# Patient Record
Sex: Female | Born: 1990 | Race: Black or African American | Hispanic: No | Marital: Single
Health system: Southern US, Community
[De-identification: ages and names within clinical notes are randomized; demographics above are authoritative.]

## PROBLEM LIST (undated history)

## (undated) ENCOUNTER — Inpatient Hospital Stay (HOSPITAL_COMMUNITY): Payer: Self-pay

## (undated) DIAGNOSIS — O3410 Maternal care for benign tumor of corpus uteri, unspecified trimester: Secondary | ICD-10-CM

## (undated) DIAGNOSIS — D259 Leiomyoma of uterus, unspecified: Secondary | ICD-10-CM

## (undated) DIAGNOSIS — Q512 Other doubling of uterus, unspecified: Secondary | ICD-10-CM

## (undated) DIAGNOSIS — Q5128 Other doubling of uterus, other specified: Secondary | ICD-10-CM

## (undated) DIAGNOSIS — R112 Nausea with vomiting, unspecified: Secondary | ICD-10-CM

## (undated) DIAGNOSIS — Z789 Other specified health status: Secondary | ICD-10-CM

## (undated) DIAGNOSIS — L509 Urticaria, unspecified: Secondary | ICD-10-CM

## (undated) DIAGNOSIS — T4145XA Adverse effect of unspecified anesthetic, initial encounter: Secondary | ICD-10-CM

## (undated) DIAGNOSIS — T8859XA Other complications of anesthesia, initial encounter: Secondary | ICD-10-CM

## (undated) DIAGNOSIS — D649 Anemia, unspecified: Secondary | ICD-10-CM

## (undated) DIAGNOSIS — Z9889 Other specified postprocedural states: Secondary | ICD-10-CM

## (undated) DIAGNOSIS — I499 Cardiac arrhythmia, unspecified: Secondary | ICD-10-CM

## (undated) HISTORY — PX: DILATION AND CURETTAGE OF UTERUS: SHX78

## (undated) HISTORY — PX: NO PAST SURGERIES: SHX2092

---
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
## 2010-02-20 ENCOUNTER — Emergency Department (HOSPITAL_COMMUNITY): Admission: EM | Admit: 2010-02-20 | Discharge: 2010-02-21 | Payer: Self-pay | Admitting: Emergency Medicine

## 2010-05-10 ENCOUNTER — Inpatient Hospital Stay (HOSPITAL_COMMUNITY): Admission: AD | Admit: 2010-05-10 | Discharge: 2010-05-13 | Payer: Self-pay | Admitting: Obstetrics

## 2010-11-11 ENCOUNTER — Ambulatory Visit (INDEPENDENT_AMBULATORY_CARE_PROVIDER_SITE_OTHER): Payer: Self-pay

## 2010-11-11 ENCOUNTER — Inpatient Hospital Stay (INDEPENDENT_AMBULATORY_CARE_PROVIDER_SITE_OTHER)
Admission: RE | Admit: 2010-11-11 | Discharge: 2010-11-11 | Disposition: A | Discharge status: Home / Self Care | Payer: Self-pay | Source: Ambulatory Visit | Attending: Family Medicine | Admitting: Family Medicine

## 2010-11-11 DIAGNOSIS — M79609 Pain in unspecified limb: Secondary | ICD-10-CM

## 2010-11-18 LAB — CBC
HCT: 30.4 % — ABNORMAL LOW (ref 36.0–46.0)
HCT: 34 % — ABNORMAL LOW (ref 36.0–46.0)
Hemoglobin: 10.1 g/dL — ABNORMAL LOW (ref 12.0–15.0)
Hemoglobin: 11.3 g/dL — ABNORMAL LOW (ref 12.0–15.0)
MCH: 27.3 pg (ref 26.0–34.0)
MCH: 27.7 pg (ref 26.0–34.0)
MCHC: 33.2 g/dL (ref 30.0–36.0)
MCHC: 33.2 g/dL (ref 30.0–36.0)
MCV: 82.3 fL (ref 78.0–100.0)
MCV: 83.4 fL (ref 78.0–100.0)
Platelets: 181 10*3/uL (ref 150–400)
Platelets: 198 10*3/uL (ref 150–400)
RBC: 3.64 MIL/uL — ABNORMAL LOW (ref 3.87–5.11)
RBC: 4.14 MIL/uL (ref 3.87–5.11)
RDW: 14.6 % (ref 11.5–15.5)
RDW: 14.9 % (ref 11.5–15.5)
WBC: 11.6 10*3/uL — ABNORMAL HIGH (ref 4.0–10.5)
WBC: 12.9 10*3/uL — ABNORMAL HIGH (ref 4.0–10.5)

## 2010-11-18 LAB — RPR: RPR Ser Ql: NONREACTIVE

## 2010-11-21 LAB — CBC
HCT: 33.6 % — ABNORMAL LOW (ref 36.0–46.0)
Hemoglobin: 11.3 g/dL — ABNORMAL LOW (ref 12.0–15.0)
MCHC: 33.8 g/dL (ref 30.0–36.0)
MCV: 86.6 fL (ref 78.0–100.0)
Platelets: 185 10*3/uL (ref 150–400)
RBC: 3.87 MIL/uL (ref 3.87–5.11)
RDW: 13.4 % (ref 11.5–15.5)
WBC: 13.6 10*3/uL — ABNORMAL HIGH (ref 4.0–10.5)

## 2010-11-21 LAB — URINALYSIS, ROUTINE W REFLEX MICROSCOPIC
Bilirubin Urine: NEGATIVE
Glucose, UA: NEGATIVE mg/dL
Hgb urine dipstick: NEGATIVE
Ketones, ur: >80 mg/dL — AB
Nitrite: NEGATIVE
Protein, ur: NEGATIVE mg/dL
Specific Gravity, Urine: 1.014 (ref 1.005–1.030)
Urobilinogen, UA: 0.2 mg/dL (ref 0.0–1.0)
pH: 6 (ref 5.0–8.0)

## 2010-11-21 LAB — BASIC METABOLIC PANEL
BUN: 3 mg/dL — ABNORMAL LOW (ref 6–23)
CO2: 22 mEq/L (ref 19–32)
Calcium: 8.5 mg/dL (ref 8.4–10.5)
Chloride: 108 mEq/L (ref 96–112)
Creatinine, Ser: 0.61 mg/dL (ref 0.4–1.2)
GFR calc Af Amer: >60 mL/min (ref >60)
GFR calc non Af Amer: >60 mL/min (ref >60)
Glucose, Bld: 82 mg/dL (ref 70–99)
Potassium: 3.3 mEq/L — ABNORMAL LOW (ref 3.5–5.1)
Sodium: 136 mEq/L (ref 135–145)

## 2010-11-21 LAB — POCT CARDIAC MARKERS
CKMB, poc: <1 ng/mL — ABNORMAL LOW (ref 1.0–8.0)
Myoglobin, poc: 31.3 ng/mL (ref 12–200)
Troponin i, poc: <0.05 ng/mL (ref 0.00–0.09)

## 2010-11-21 LAB — DIFFERENTIAL
Basophils Absolute: 0 10*3/uL (ref 0.0–0.1)
Basophils Relative: 0 % (ref 0–1)
Eosinophils Absolute: 0 10*3/uL (ref 0.0–0.7)
Eosinophils Relative: 0 % (ref 0–5)
Lymphocytes Relative: 4 % — ABNORMAL LOW (ref 12–46)
Lymphs Abs: 0.5 10*3/uL — ABNORMAL LOW (ref 0.7–4.0)
Monocytes Absolute: 1 10*3/uL (ref 0.1–1.0)
Monocytes Relative: 7 % (ref 3–12)
Neutro Abs: 12.1 10*3/uL — ABNORMAL HIGH (ref 1.7–7.7)
Neutrophils Relative %: 89 % — ABNORMAL HIGH (ref 43–77)

## 2010-11-25 LAB — URINALYSIS, ROUTINE W REFLEX MICROSCOPIC
Bilirubin Urine: NEGATIVE
Glucose, UA: NEGATIVE mg/dL
Ketones, ur: 40 mg/dL — AB
Nitrite: NEGATIVE
Protein, ur: NEGATIVE mg/dL
Specific Gravity, Urine: 1.02 (ref 1.005–1.030)
Urobilinogen, UA: 1 mg/dL (ref 0.0–1.0)
pH: 7 (ref 5.0–8.0)

## 2010-11-25 LAB — GC/CHLAMYDIA PROBE AMP, GENITAL
Chlamydia, DNA Probe: NEGATIVE
GC Probe Amp, Genital: NEGATIVE

## 2010-11-25 LAB — URINE MICROSCOPIC-ADD ON

## 2010-11-25 LAB — HCG, QUANTITATIVE, PREGNANCY: hCG, Beta Chain, Quant, S: 104078 m[IU]/mL — ABNORMAL HIGH (ref <5)

## 2010-11-25 LAB — POCT PREGNANCY, URINE: Preg Test, Ur: POSITIVE

## 2010-11-25 LAB — WET PREP, GENITAL
Clue Cells Wet Prep HPF POC: NONE SEEN
Trich, Wet Prep: NONE SEEN
Yeast Wet Prep HPF POC: NONE SEEN

## 2011-02-26 ENCOUNTER — Inpatient Hospital Stay (INDEPENDENT_AMBULATORY_CARE_PROVIDER_SITE_OTHER)
Admission: RE | Admit: 2011-02-26 | Discharge: 2011-02-26 | Disposition: A | Discharge status: Home / Self Care | Payer: Self-pay | Source: Ambulatory Visit | Attending: Family Medicine | Admitting: Family Medicine

## 2011-02-26 DIAGNOSIS — L509 Urticaria, unspecified: Secondary | ICD-10-CM

## 2012-02-02 ENCOUNTER — Encounter (HOSPITAL_COMMUNITY): Payer: Self-pay

## 2012-02-02 ENCOUNTER — Emergency Department (HOSPITAL_COMMUNITY)
Admission: EM | Admit: 2012-02-02 | Discharge: 2012-02-02 | Disposition: A | Discharge status: Home / Self Care | Payer: Self-pay | Attending: Emergency Medicine | Admitting: Emergency Medicine

## 2012-02-02 ENCOUNTER — Emergency Department (HOSPITAL_COMMUNITY): Payer: Self-pay

## 2012-02-02 DIAGNOSIS — S90129A Contusion of unspecified lesser toe(s) without damage to nail, initial encounter: Secondary | ICD-10-CM

## 2012-02-02 DIAGNOSIS — M25579 Pain in unspecified ankle and joints of unspecified foot: Secondary | ICD-10-CM | POA: Insufficient documentation

## 2012-02-02 DIAGNOSIS — S9030XA Contusion of unspecified foot, initial encounter: Secondary | ICD-10-CM | POA: Insufficient documentation

## 2012-02-02 DIAGNOSIS — R51 Headache: Secondary | ICD-10-CM | POA: Insufficient documentation

## 2012-02-02 DIAGNOSIS — M79609 Pain in unspecified limb: Secondary | ICD-10-CM | POA: Insufficient documentation

## 2012-02-02 LAB — POCT PREGNANCY, URINE: Preg Test, Ur: NEGATIVE

## 2012-02-02 MED ORDER — ACETAMINOPHEN 325 MG PO TABS
650.0000 mg | ORAL_TABLET | Freq: Once | ORAL | Status: AC
  Administered 2012-02-02: 650 mg via ORAL
  Filled 2012-02-02: qty 2

## 2012-02-02 NOTE — Discharge Instructions (Signed)
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
## 2012-02-03 NOTE — ED Provider Notes (Signed)
Medical screening examination/treatment/procedure(s) were performed by non-physician practitioner and as supervising physician I was immediately available for consultation/collaboration.   Dione Booze, MD 02/03/12 1210

## 2012-02-08 ENCOUNTER — Encounter (HOSPITAL_COMMUNITY): Payer: Self-pay | Admitting: Emergency Medicine

## 2012-02-08 ENCOUNTER — Emergency Department (HOSPITAL_COMMUNITY)
Admission: EM | Admit: 2012-02-08 | Discharge: 2012-02-09 | Disposition: A | Discharge status: Home / Self Care | Payer: Self-pay

## 2012-02-08 DIAGNOSIS — E876 Hypokalemia: Secondary | ICD-10-CM

## 2012-02-08 DIAGNOSIS — S29011A Strain of muscle and tendon of front wall of thorax, initial encounter: Secondary | ICD-10-CM

## 2012-02-08 DIAGNOSIS — R0602 Shortness of breath: Secondary | ICD-10-CM | POA: Insufficient documentation

## 2012-02-08 DIAGNOSIS — IMO0002 Reserved for concepts with insufficient information to code with codable children: Secondary | ICD-10-CM | POA: Insufficient documentation

## 2012-02-08 DIAGNOSIS — R079 Chest pain, unspecified: Secondary | ICD-10-CM | POA: Insufficient documentation

## 2012-02-08 NOTE — ED Notes (Signed)
PT. REPORTS SUBSTERNAL CHEST PAIN AND BILATERAL ARM PAIN ONSET THIS EVENING WITH SOB , NAUSEA AND LIGHTHEADEDNESS.

## 2012-02-09 ENCOUNTER — Emergency Department (HOSPITAL_COMMUNITY): Payer: Self-pay

## 2012-02-09 LAB — BASIC METABOLIC PANEL
BUN: 10 mg/dL (ref 6–23)
CO2: 20 mEq/L (ref 19–32)
Calcium: 9.6 mg/dL (ref 8.4–10.5)
Chloride: 105 mEq/L (ref 96–112)
Creatinine, Ser: 0.51 mg/dL (ref 0.50–1.10)
GFR calc Af Amer: >90 mL/min (ref >90)
GFR calc non Af Amer: >90 mL/min (ref >90)
Glucose, Bld: 80 mg/dL (ref 70–99)
Potassium: 3 mEq/L — ABNORMAL LOW (ref 3.5–5.1)
Sodium: 140 mEq/L (ref 135–145)

## 2012-02-09 LAB — DIFFERENTIAL
Basophils Absolute: 0 10*3/uL (ref 0.0–0.1)
Basophils Relative: 0 % (ref 0–1)
Eosinophils Absolute: 0.1 10*3/uL (ref 0.0–0.7)
Eosinophils Relative: 1 % (ref 0–5)
Lymphocytes Relative: 31 % (ref 12–46)
Lymphs Abs: 3.8 10*3/uL (ref 0.7–4.0)
Monocytes Absolute: 1 10*3/uL (ref 0.1–1.0)
Monocytes Relative: 8 % (ref 3–12)
Neutro Abs: 7.3 10*3/uL (ref 1.7–7.7)
Neutrophils Relative %: 60 % (ref 43–77)

## 2012-02-09 LAB — CBC
HCT: 36.5 % (ref 36.0–46.0)
Hemoglobin: 11.7 g/dL — ABNORMAL LOW (ref 12.0–15.0)
MCH: 26.9 pg (ref 26.0–34.0)
MCHC: 32.1 g/dL (ref 30.0–36.0)
MCV: 83.9 fL (ref 78.0–100.0)
Platelets: 374 10*3/uL (ref 150–400)
RBC: 4.35 MIL/uL (ref 3.87–5.11)
RDW: 15.8 % — ABNORMAL HIGH (ref 11.5–15.5)
WBC: 12.3 10*3/uL — ABNORMAL HIGH (ref 4.0–10.5)

## 2012-02-09 LAB — POCT I-STAT TROPONIN I: Troponin i, poc: 0 ng/mL (ref 0.00–0.08)

## 2012-02-09 MED ORDER — POTASSIUM CHLORIDE CRYS ER 20 MEQ PO TBCR
40.0000 meq | EXTENDED_RELEASE_TABLET | Freq: Once | ORAL | Status: AC
  Administered 2012-02-09: 40 meq via ORAL
  Filled 2012-02-09: qty 2

## 2012-02-09 MED ORDER — CYCLOBENZAPRINE HCL 5 MG PO TABS: 5.0000 mg | ORAL_TABLET | Freq: Three times a day (TID) | ORAL | Status: DC | PRN

## 2012-02-09 MED ORDER — KETOROLAC TROMETHAMINE 30 MG/ML IJ SOLN
30.0000 mg | Freq: Once | INTRAMUSCULAR | Status: AC
  Administered 2012-02-09: 30 mg via INTRAMUSCULAR
  Filled 2012-02-09: qty 1

## 2012-02-09 MED ORDER — HYDROCODONE-ACETAMINOPHEN 5-500 MG PO TABS: 1.0000 | ORAL_TABLET | Freq: Four times a day (QID) | ORAL | Status: DC | PRN
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
## 2012-02-09 NOTE — ED Notes (Signed)
Pt. C/o chest tightness and bilateral arm tightness that started when she was eating. States she got short of breath, lightheaded, and nauseous at that time. Pt. Resting comfortably in bed.

## 2012-02-09 NOTE — ED Provider Notes (Signed)
Medical screening examination/treatment/procedure(s) were performed by non-physician practitioner and as supervising physician I was immediately available for consultation/collaboration.  Olivia Mackie, MD 02/09/12 [phone]

## 2012-02-09 NOTE — ED Notes (Signed)
Prescriptions X2 given with discharge instructions.  

## 2012-07-08 ENCOUNTER — Inpatient Hospital Stay (HOSPITAL_COMMUNITY)
Admission: AD | Admit: 2012-07-08 | Discharge: 2012-07-08 | Disposition: A | Discharge status: Home / Self Care | Payer: Medicaid Other | Source: Ambulatory Visit | Attending: Obstetrics & Gynecology | Admitting: Obstetrics & Gynecology

## 2012-07-08 ENCOUNTER — Encounter (HOSPITAL_COMMUNITY): Payer: Self-pay | Admitting: *Deleted

## 2012-07-08 ENCOUNTER — Inpatient Hospital Stay (HOSPITAL_COMMUNITY): Payer: Medicaid Other

## 2012-07-08 ENCOUNTER — Inpatient Hospital Stay (HOSPITAL_COMMUNITY)
Admission: AD | Admit: 2012-07-08 | Discharge: 2012-07-08 | Discharge status: Against Medical Advice | Payer: No Typology Code available for payment source | Attending: Obstetrics & Gynecology | Admitting: Obstetrics & Gynecology

## 2012-07-08 DIAGNOSIS — O26859 Spotting complicating pregnancy, unspecified trimester: Secondary | ICD-10-CM

## 2012-07-08 LAB — URINALYSIS, ROUTINE W REFLEX MICROSCOPIC
Bilirubin Urine: NEGATIVE
Glucose, UA: NEGATIVE mg/dL
Hgb urine dipstick: NEGATIVE
Ketones, ur: NEGATIVE mg/dL
Leukocytes, UA: NEGATIVE
Nitrite: NEGATIVE
Protein, ur: NEGATIVE mg/dL
Specific Gravity, Urine: 1.02 (ref 1.005–1.030)
Urobilinogen, UA: 0.2 mg/dL (ref 0.0–1.0)
pH: 7 (ref 5.0–8.0)

## 2012-07-08 LAB — CBC
HCT: 38.6 % (ref 36.0–46.0)
Hemoglobin: 12.9 g/dL (ref 12.0–15.0)
MCH: 27.8 pg (ref 26.0–34.0)
MCHC: 33.4 g/dL (ref 30.0–36.0)
MCV: 83.2 fL (ref 78.0–100.0)
Platelets: 234 10*3/uL (ref 150–400)
RBC: 4.64 MIL/uL (ref 3.87–5.11)
RDW: 13.4 % (ref 11.5–15.5)
WBC: 9.6 10*3/uL (ref 4.0–10.5)

## 2012-07-08 LAB — HCG, QUANTITATIVE, PREGNANCY: hCG, Beta Chain, Quant, S: 42250 m[IU]/mL — ABNORMAL HIGH (ref <5)

## 2012-07-08 LAB — POCT PREGNANCY, URINE: Preg Test, Ur: POSITIVE — AB

## 2012-07-08 LAB — WET PREP, GENITAL
Trich, Wet Prep: NONE SEEN
Yeast Wet Prep HPF POC: NONE SEEN

## 2012-07-08 LAB — ABO/RH: ABO/RH(D): O POS

## 2012-07-08 NOTE — Discharge Instructions (Signed)
Vaginal Bleeding During Pregnancy, First Trimester A small amount of bleeding (spotting) is relatively common in early pregnancy. It usually stops on its own. There are many causes for bleeding or spotting in early pregnancy. Some bleeding may be related to the pregnancy and some may not. Cramping with the bleeding is more serious and concerning. Tell your caregiver if you have any vaginal bleeding.  CAUSES   It is normal in most cases.  The pregnancy ends (miscarriage).  The pregnancy may end (threatened miscarriage).  Infection or inflammation of the cervix.  Growths (polyps) on the cervix.  Pregnancy happens outside of the uterus and in a fallopian tube (tubal pregnancy).  Many tiny cysts in the uterus instead of pregnancy tissue (molar pregnancy). SYMPTOMS  Vaginal bleeding or spotting with or without cramps. DIAGNOSIS  To evaluate the pregnancy, your caregiver may:  Do a pelvic exam.  Take blood tests.  Do an ultrasound. It is very important to follow your caregiver's instructions.  TREATMENT   Evaluation of the pregnancy with blood tests and ultrasound.  Bed rest (getting up to use the bathroom only).  Rho-gam immunization if the mother is Rh negative and the father is Rh positive. HOME CARE INSTRUCTIONS   If your caregiver orders bed rest, you may need to make arrangements for the care of other children and for other responsibilities. However, your caregiver may allow you to continue light activity.  Keep track of the number of pads you use each day, how often you change pads and how soaked (saturated) they are. Write this down.  Do not use tampons. Do not douche.  Do not have sexual intercourse or orgasms until approved by your physician.  Save any tissue that you pass for your caregiver to see.  Take medicine for cramps only with your caregiver's permission.  Do not take aspirin because it can make you bleed. SEEK IMMEDIATE MEDICAL CARE IF:   You  experience severe cramps in your stomach, back or belly (abdomen).  You have an oral temperature above 102 F (38.9 C), not controlled by medicine.  You pass large clots or tissue.  Your bleeding increases or you become light-headed, weak or have fainting episodes.  You develop chills.  You are leaking or have a gush of fluid from your vagina.  You pass out while having a bowel movement. That may mean you have a ruptured tubal pregnancy. Document Released: 06/01/2005 Document Revised: 11/14/2011 Document Reviewed: 12/11/2008 [facility] Patient Information 2013 [place], [place]. Vaginal Bleeding During Pregnancy, First Trimester A small amount of bleeding (spotting) is relatively common in early pregnancy. It usually stops on its own. There are many causes for bleeding or spotting in early pregnancy. Some bleeding may be related to the pregnancy and some may not. Cramping with the bleeding is more serious and concerning. Tell your caregiver if you have any vaginal bleeding.  CAUSES   It is normal in most cases.  The pregnancy ends (miscarriage).  The pregnancy may end (threatened miscarriage).  Infection or inflammation of the cervix.  Growths (polyps) on the cervix.  Pregnancy happens outside of the uterus and in a fallopian tube (tubal pregnancy).  Many tiny cysts in the uterus instead of pregnancy tissue (molar pregnancy). SYMPTOMS  Vaginal bleeding or spotting with or without cramps. DIAGNOSIS  To evaluate the pregnancy, your caregiver may:  Do a pelvic exam.  Take blood tests.  Do an ultrasound. It is very important to follow your caregiver's instructions.  TREATMENT   Evaluation  of the pregnancy with blood tests and ultrasound.  Bed rest (getting up to use the bathroom only).  Rho-gam immunization if the mother is Rh negative and the father is Rh positive. HOME CARE INSTRUCTIONS   If your caregiver orders bed rest, you may need to make arrangements for the care  of other children and for other responsibilities. However, your caregiver may allow you to continue light activity.  Keep track of the number of pads you use each day, how often you change pads and how soaked (saturated) they are. Write this down.  Do not use tampons. Do not douche.  Do not have sexual intercourse or orgasms until approved by your physician.  Save any tissue that you pass for your caregiver to see.  Take medicine for cramps only with your caregiver's permission.  Do not take aspirin because it can make you bleed. SEEK IMMEDIATE MEDICAL CARE IF:   You experience severe cramps in your stomach, back or belly (abdomen).  You have an oral temperature above 102 F (38.9 C), not controlled by medicine.  You pass large clots or tissue.  Your bleeding increases or you become light-headed, weak or have fainting episodes.  You develop chills.  You are leaking or have a gush of fluid from your vagina.  You pass out while having a bowel movement. That may mean you have a ruptured tubal pregnancy. Document Released: 06/01/2005 Document Revised: 11/14/2011 Document Reviewed: 12/11/2008 [facility] Patient Information 2013 [place], [place].

## 2012-07-08 NOTE — MAU Provider Note (Signed)
History     CSN: [id number]  Arrival date and time: 07/08/12 4098   None     Chief Complaint  Patient presents with  . Vaginal Bleeding   HPI 21 y.o. G2P1001 at Unknown EGA with low abd pain, radiates around to back, intermittent and sharp/crampy, x 1 week. Spotting x 2 days. LMP end of September.    History reviewed. No pertinent past medical history.  No past surgical history on file.  No family history on file.  History  Substance Use Topics  . Smoking status: Not on file  . Smokeless tobacco: Not on file  . Alcohol Use:     Allergies: No Known Allergies  Prescriptions prior to admission  Medication Sig Dispense Refill  . loratadine (CLARITIN REDITABS) 10 MG dissolvable tablet Take 10 mg by mouth every morning.      . Pediatric Multivit-Minerals-C (FLINTSTONES COMPLETE PO) Take 1 tablet by mouth every morning.        Review of Systems  Constitutional: Negative.   Respiratory: Negative.   Cardiovascular: Negative.   Gastrointestinal: Positive for abdominal pain. Negative for nausea, vomiting, diarrhea and constipation.  Genitourinary: Negative for dysuria, urgency, frequency, hematuria and flank pain.       Positive spotting   Musculoskeletal: Positive for back pain.  Neurological: Negative.   Psychiatric/Behavioral: Negative.    Physical Exam   Blood pressure 126/71, pulse 99, temperature 96.9 F (36.1 C), temperature source Oral, resp. rate 18, height 5\' 2"  (1.575 m), weight 190 lb (86.183 kg).  Physical Exam  Nursing note and vitals reviewed. Constitutional: She is oriented to person, place, and time. She appears well-developed and well-nourished. No distress.  Cardiovascular: Normal rate.   Respiratory: Effort normal.  GI: Soft. There is no tenderness.  Genitourinary: There is no tenderness or lesion on the right labia. There is no tenderness or lesion on the left labia. Uterus is not enlarged and not tender. Cervix exhibits no motion tenderness, no  discharge and no friability. Right adnexum displays no mass, no tenderness and no fullness. Left adnexum displays no mass, no tenderness and no fullness. No bleeding around the vagina. Vaginal discharge (small, yellowish) found.  Musculoskeletal: Normal range of motion.  Neurological: She is alert and oriented to person, place, and time.  Skin: Skin is warm and dry.  Psychiatric: She has a normal mood and affect.    MAU Course  Procedures  Results for orders placed during the hospital encounter of 07/08/12 (from the past 24 hour(s))  URINALYSIS, ROUTINE W REFLEX MICROSCOPIC     Status: Normal   Collection Time   07/08/12  8:50 AM      Component Value Range   Color, Urine YELLOW  YELLOW   APPearance CLEAR  CLEAR   Specific Gravity, Urine 1.020  1.005 - 1.030   pH 7.0  5.0 - 8.0   Glucose, UA NEGATIVE  NEGATIVE mg/dL   Hgb urine dipstick NEGATIVE  NEGATIVE   Bilirubin Urine NEGATIVE  NEGATIVE   Ketones, ur NEGATIVE  NEGATIVE mg/dL   Protein, ur NEGATIVE  NEGATIVE mg/dL   Urobilinogen, UA 0.2  0.0 - 1.0 mg/dL   Nitrite NEGATIVE  NEGATIVE   Leukocytes, UA NEGATIVE  NEGATIVE  CBC     Status: Normal   Collection Time   07/08/12  9:15 AM      Component Value Range   WBC 9.6  4.0 - 10.5 K/uL   RBC 4.64  3.87 - 5.11 MIL/uL   Hemoglobin  12.9  12.0 - 15.0 g/dL   HCT 16.1  09.6 - 04.5 %   MCV 83.2  78.0 - 100.0 fL   MCH 27.8  26.0 - 34.0 pg   MCHC 33.4  30.0 - 36.0 g/dL   RDW 40.9  81.1 - 91.4 %   Platelets 234  150 - 400 K/uL  ABO/RH     Status: Normal   Collection Time   07/08/12  9:15 AM      Component Value Range   ABO/RH(D) O POS    WET PREP, GENITAL     Status: Abnormal   Collection Time   07/08/12  9:25 AM      Component Value Range   Yeast Wet Prep HPF POC NONE SEEN  NONE SEEN   Trich, Wet Prep NONE SEEN  NONE SEEN   Clue Cells Wet Prep HPF POC FEW (*) NONE SEEN   WBC, Wet Prep HPF POC FEW (*) NONE SEEN  POCT PREGNANCY, URINE     Status: Abnormal   Collection Time    07/08/12  9:39 AM      Component Value Range   Preg Test, Ur POSITIVE (*) NEGATIVE   US Ob Comp Less 14 Wks  07/08/2012  *RADIOLOGY REPORT*  Clinical Data: Early pregnancy.  Spotting.  OBSTETRIC <14 WK ULTRASOUND, TRANSVAGINAL OB US,  Technique:  Transabdominal and transvaginal ultrasound was performed for evaluation of the gestation as well as the maternal uterus and adnexal regions.  Number of gestation: 1 Heart Rate: 156  bpm  CRL:  1.0         7w  1d                   [place] EDC: 02/23/2013  Maternal uterus/adnexae: There is no subchorionic hemorrhage.  Both ovaries appear normal.  No free fluid within the pelvis.  IMPRESSION:  1.  Single living intrauterine gestation. 2.  Estimated gestational age is 7 weeks and 1 day.   Original Report Authenticated By: Signa Kell, M.D.     Assessment and Plan  G2P1001 at 7.[redacted] weeks EGA 1. Spotting in early pregnancy   Pregnancy verification given    Medication List     As of 07/08/2012 10:22 AM    CONTINUE taking these medications         FLINTSTONES COMPLETE PO      loratadine 10 MG dissolvable tablet   Commonly known as: CLARITIN REDITABS         Follow-up Information    Follow up with [facility] [place]. (start prenatal care as soon as possible)    Contact information:   [address] [place] [address] [phone]           Melissa Diaz 07/08/2012, 10:22 AM

## 2012-07-08 NOTE — MAU Note (Signed)
Pt states she is having some back pain and bleeding, states she does not know if she is pregnant. Last period was the end of september

## 2012-07-09 LAB — GC/CHLAMYDIA PROBE AMP, GENITAL
Chlamydia, DNA Probe: NEGATIVE
GC Probe Amp, Genital: NEGATIVE

## 2012-08-16 ENCOUNTER — Inpatient Hospital Stay (HOSPITAL_COMMUNITY)
Admission: AD | Admit: 2012-08-16 | Discharge: 2012-08-16 | Disposition: A | Discharge status: Home / Self Care | Payer: Medicaid Other | Source: Ambulatory Visit | Attending: Obstetrics & Gynecology | Admitting: Obstetrics & Gynecology

## 2012-08-16 ENCOUNTER — Encounter (HOSPITAL_COMMUNITY): Payer: Self-pay | Admitting: Obstetrics and Gynecology

## 2012-08-16 DIAGNOSIS — R109 Unspecified abdominal pain: Secondary | ICD-10-CM | POA: Insufficient documentation

## 2012-08-16 DIAGNOSIS — O26859 Spotting complicating pregnancy, unspecified trimester: Secondary | ICD-10-CM | POA: Insufficient documentation

## 2012-08-16 DIAGNOSIS — O209 Hemorrhage in early pregnancy, unspecified: Secondary | ICD-10-CM

## 2012-08-16 HISTORY — DX: Other specified health status: Z78.9

## 2012-08-16 LAB — URINALYSIS, ROUTINE W REFLEX MICROSCOPIC
Bilirubin Urine: NEGATIVE
Glucose, UA: NEGATIVE mg/dL
Ketones, ur: 15 mg/dL — AB
Leukocytes, UA: NEGATIVE
Nitrite: NEGATIVE
Protein, ur: NEGATIVE mg/dL
Specific Gravity, Urine: >1.03 — ABNORMAL HIGH (ref 1.005–1.030)
Urobilinogen, UA: 0.2 mg/dL (ref 0.0–1.0)
pH: 6.5 (ref 5.0–8.0)

## 2012-08-16 LAB — URINE MICROSCOPIC-ADD ON

## 2012-08-16 NOTE — MAU Provider Note (Signed)
Chief Complaint: Abdominal Pain and Vaginal Bleeding   First Provider Initiated Contact with Patient 08/16/12 2100     SUBJECTIVE HPI: Melissa Diaz is a 21 y.o. G2P1001 at [redacted]w[redacted]d by LMP who presents after having vaginal spotting noted when she wiped after voiding this evening. She describes mucousy blood. It was associated with abdominal cramping but she is not having abdominal pain now.She also has some spotting at 7 weeks when she had an ultrasound showing a viable IUP. The opposite was not related to antecedent intercourse. She had negative GC and Chlamydia last month. He denies irritating vaginal discharge. Denies dysuria, hematuria, urgency or from severe nation.  Past Medical History  Diagnosis Date  . No pertinent past medical history    OB History    Grav Para Term Preterm Abortions TAB SAB Ect Mult Living   2 1 1  0 0 0 0 0 0 1     # Outc Date GA Lbr Len/2nd Wgt Sex Del Anes PTL Lv   1 TRM 9/11    M SVD EPI No Yes   2 CUR              Past Surgical History  Procedure Date  . No past surgeries    History   Social History  . Marital Status: Single    Spouse Name: N/A    Number of Children: N/A  . Years of Education: N/A   Occupational History  . Not on file.   Social History Main Topics  . Smoking status: Never Smoker   . Smokeless tobacco: Never Used  . Alcohol Use: No  . Drug Use: No  . Sexually Active: Yes   Other Topics Concern  . Not on file   Social History Narrative  . No narrative on file   No current facility-administered medications on file prior to encounter.   Current Outpatient Prescriptions on File Prior to Encounter  Medication Sig Dispense Refill  . loratadine (CLARITIN REDITABS) 10 MG dissolvable tablet Take 10 mg by mouth every morning.      . Pediatric Multivit-Minerals-C (FLINTSTONES COMPLETE PO) Take 1 tablet by mouth every morning.       No Known Allergies  ROS: Pertinent items in HPI  OBJECTIVE Blood pressure 121/90, pulse  116, temperature 98.3 F (36.8 C), temperature source Oral, resp. rate 16, height 5\' 1"  (1.549 m), weight 207 lb (93.895 kg). GENERAL: Well-developed, well-nourished female in no acute distress.  HEENT: Normocephalic HEART: normal rate RESP: normal effort ABDOMEN: Soft, non-tender EXTREMITIES: Nontender, no edema NEURO: Alert and oriented SPECULUM EXAM: NEFG, physiologic discharge, scant pink blood noted, cervix clean and no active bleeding BIMANUAL: cervix long closed high; uterus 12 wks size, no adnexal tenderness or masses  LAB RESULTS Results for orders placed during the hospital encounter of 08/16/12 (from the past 24 hour(s))  URINALYSIS, ROUTINE W REFLEX MICROSCOPIC     Status: Abnormal   Collection Time   08/16/12  7:37 PM      Component Value Range   Color, Urine YELLOW  YELLOW   APPearance CLEAR  CLEAR   Specific Gravity, Urine >1.030 (*) 1.005 - 1.030   pH 6.5  5.0 - 8.0   Glucose, UA NEGATIVE  NEGATIVE mg/dL   Hgb urine dipstick MODERATE (*) NEGATIVE   Bilirubin Urine NEGATIVE  NEGATIVE   Ketones, ur 15 (*) NEGATIVE mg/dL   Protein, ur NEGATIVE  NEGATIVE mg/dL   Urobilinogen, UA 0.2  0.0 - 1.0 mg/dL  Nitrite NEGATIVE  NEGATIVE   Leukocytes, UA NEGATIVE  NEGATIVE  URINE MICROSCOPIC-ADD ON     Status: Abnormal   Collection Time   08/16/12  7:37 PM      Component Value Range   Squamous Epithelial / LPF RARE  RARE   WBC, UA 0-2  <3 WBC/hpf   RBC / HPF 7-10  <3 RBC/hpf   Bacteria, UA FEW (*) RARE    IMAGING  Bedside ultrasound done to reassure couple: fetal activity and fetal cardiac activity seen    ASSESSMENT 1. Bleeding in early pregnancy   Viable IUP [redacted]w[redacted]d  PLAN Discharge home with pelvic rest until visit with Dr. Gaynell Face. Return if severe pain or heavy bleeding. See AVS    Medication List     As of 08/16/2012  9:07 PM    TAKE these medications         FLINTSTONES COMPLETE PO   Take 1 tablet by mouth every morning.      loratadine 10 MG  dissolvable tablet   Commonly known as: CLARITIN REDITABS   Take 10 mg by mouth every morning.          Danae Orleans, CNM 08/16/2012  9:01 PM

## 2012-08-16 NOTE — MAU Note (Signed)
Pt G2 P1 at 12.5wks had LUQ pain earlier today, now having lower abd cramping, started bleeding this evening.

## 2012-08-16 NOTE — Discharge Instructions (Signed)
Threatened Miscarriage Bleeding during the first 20 weeks of pregnancy is common. This is sometimes called a threatened miscarriage. This is a pregnancy that is threatening to end before the twentieth week of pregnancy. Often this bleeding stops with bed rest or decreased activities as suggested by your caregiver and the pregnancy continues without any more problems. You may be asked to not have sexual intercourse, have orgasms or use tampons until further notice. Sometimes a threatened miscarriage can progress to a complete or incomplete miscarriage. This may or may not require further treatment. Some miscarriages occur before a woman misses a menstrual period and knows she is pregnant. Miscarriages occur in 43 to 20% of all pregnancies and usually occur during the first 13 weeks of the pregnancy. The exact cause of a miscarriage is usually never known. A miscarriage is natures way of ending a pregnancy that is abnormal or would not make it to term. There are some things that may put you at risk to have a miscarriage, such as:  Hormone problems.  Infection of the uterus or cervix.  Chronic illness, diabetes for example, especially if it is not controlled.  Abnormal shaped uterus.  Fibroids in the uterus.  Incompetent cervix (the cervix is too weak to hold the baby).  Smoking.  Drinking too much alcohol. It's best not to drink any alcohol when you are pregnant.  Taking illegal drugs. TREATMENT  When a miscarriage becomes complete and all products of conception (all the tissue in the uterus) have been passed, often no treatment is needed. If you think you passed tissue, save it in a container and take it to your doctor for evaluation. If the miscarriage is incomplete (parts of the fetus or placenta remain in the uterus), further treatment may be needed. The most common reason for further treatment is continued bleeding (hemorrhage) because pregnancy tissue did not pass out of the uterus. This  often occurs if a miscarriage is incomplete. Tissue left behind may also become infected. Treatment usually is dilatation and curettage (the removal of the remaining products of pregnancy. This can be done by a simple sucking procedure (suction curettage) or a simple scraping of the inside of the uterus. This may be done in the hospital or in the caregiver's office. This is only done when your caregiver knows that there is no chance for the pregnancy to proceed to term. This is determined by physical examination, negative pregnancy test, falling pregnancy hormone count and/or, an ultrasound revealing a dead fetus. Miscarriages are often a very emotional time for prospective mothers and fathers. This is not you or your partners fault. It did not occur because of an inadequacy in you or your partner. Nearly all miscarriages occur because the pregnancy has started off wrongly. At least half of these pregnancies have a chromosomal abnormality. It is almost always not inherited. Others may have developmental problems with the fetus or placenta. This does not always show up even when the products miscarried are studied under the microscope. The miscarriage is nearly always not your fault and it is not likely that you could have prevented it from happening. If you are having emotional and grieving problems, talk to your health care provider and even seek counseling, if necessary, before getting pregnant again. You can begin trying for another pregnancy as soon as your caregiver says it is OK. HOME CARE INSTRUCTIONS   Your caregiver may order bed rest depending on how much bleeding and cramping you are having. You may be limited  to only getting up to go to the bathroom. You may be allowed to continue light activity. You may need to make arrangements for the care of your other children and for any other responsibilities.  Keep track of the number of pads you use each day, how often you have to change pads and how  saturated (soaked) they are. Record this information.  DO NOT USE TAMPONS. Do not douche, have sexual intercourse or orgasms until approved by your caregiver.  You may receive a follow up appointment for re-evaluation of your pregnancy and a repeat blood test. Re-evaluation often occurs after 2 days and again in 4 to 6 weeks. It is very important that you follow-up in the recommended time period.  If you are Rh negative and the father is Rh positive or you do not know the fathers' blood type, you may receive a shot (Rh immune globulin) to help prevent abnormal antibodies that can develop and affect the baby in any future pregnancies. SEEK IMMEDIATE MEDICAL CARE IF:  You have severe cramps in your stomach, back, or abdomen.  You have a sudden onset of severe pain in the lower part of your abdomen.  You develop chills.  You run an unexplained temperature of 101 F (38.3 C) or higher.  You pass large clots or tissue. Save any tissue for your caregiver to inspect.  Your bleeding increases or you become light-headed, weak, or have fainting episodes.  You have a gush of fluid from your vagina.  You pass out. This could mean you have a tubal (ectopic) pregnancy. Document Released: 08/22/2005 Document Revised: 11/14/2011 Document Reviewed: 04/07/2008 [facility] Patient Information 2013 [place], [place]. Pelvic Rest Pelvic rest is sometimes recommended for women when:   The placenta is partially or completely covering the opening of the cervix (placenta previa).  There is bleeding between the uterine wall and the amniotic sac in the first trimester (subchorionic hemorrhage).  The cervix begins to open without labor starting (incompetent cervix, cervical insufficiency).  The labor is too early (preterm labor). HOME CARE INSTRUCTIONS  Do not have sexual intercourse, stimulation, or an orgasm.  Do not use tampons, douche, or put anything in the vagina.  Do not lift anything over 10 pounds  (4.5 kg).  Avoid strenuous activity or straining your pelvic muscles. SEEK MEDICAL CARE IF:  You have any vaginal bleeding during pregnancy. Treat this as a potential emergency.  You have cramping pain felt low in the stomach (stronger than menstrual cramps).  You notice vaginal discharge (watery, mucus, or bloody).  You have a low, dull backache.  There are regular contractions or uterine tightening. SEEK IMMEDIATE MEDICAL CARE IF: You have vaginal bleeding and have placenta previa.  Document Released: 12/17/2010 Document Revised: 11/14/2011 Document Reviewed: 12/17/2010 [facility] Patient Information 2013 [place], [place].

## 2012-09-05 NOTE — L&D Delivery Note (Signed)
Delivery Note At 1:46 AM a viable female was delivered via  (Presentation: ;  ).  APGAR: , ; weight .   Placenta status: , .  Cord:  with the following complications: .  Cord pH: not done  Anesthesia: Epidural  Episiotomy:  Lacerations:  Suture Repair: 2.0 Est. Blood Loss (mL):   Mom to postpartum.  Baby to nursery-stable.  MARSHALL,BERNARD A 02/18/2013, 2:00 AM

## 2012-11-27 LAB — OB RESULTS CONSOLE HIV ANTIBODY (ROUTINE TESTING): HIV: NONREACTIVE

## 2012-11-27 LAB — OB RESULTS CONSOLE RUBELLA ANTIBODY, IGM: Rubella: IMMUNE

## 2012-11-27 LAB — OB RESULTS CONSOLE ANTIBODY SCREEN: Antibody Screen: NEGATIVE

## 2012-11-27 LAB — OB RESULTS CONSOLE HEPATITIS B SURFACE ANTIGEN: Hepatitis B Surface Ag: NEGATIVE

## 2012-11-27 LAB — OB RESULTS CONSOLE RPR: RPR: NONREACTIVE

## 2013-02-12 ENCOUNTER — Encounter (HOSPITAL_COMMUNITY): Payer: Self-pay | Admitting: Emergency Medicine

## 2013-02-17 ENCOUNTER — Inpatient Hospital Stay (HOSPITAL_COMMUNITY)
Admission: AD | Admit: 2013-02-17 | Discharge: 2013-02-19 | DRG: 775 | Disposition: A | Discharge status: Home / Self Care | Payer: Medicaid Other | Source: Ambulatory Visit | Attending: Obstetrics | Admitting: Obstetrics

## 2013-02-17 ENCOUNTER — Encounter (HOSPITAL_COMMUNITY): Payer: Self-pay | Admitting: Anesthesiology

## 2013-02-17 ENCOUNTER — Inpatient Hospital Stay (HOSPITAL_COMMUNITY): Payer: Medicaid Other | Admitting: Anesthesiology

## 2013-02-17 ENCOUNTER — Encounter (HOSPITAL_COMMUNITY): Payer: Self-pay | Admitting: *Deleted

## 2013-02-17 LAB — POCT FERN TEST: POCT Fern Test: POSITIVE

## 2013-02-17 LAB — CBC
HCT: 31.9 % — ABNORMAL LOW (ref 36.0–46.0)
Hemoglobin: 10.2 g/dL — ABNORMAL LOW (ref 12.0–15.0)
MCH: 24.8 pg — ABNORMAL LOW (ref 26.0–34.0)
MCHC: 32 g/dL (ref 30.0–36.0)
MCV: 77.6 fL — ABNORMAL LOW (ref 78.0–100.0)
Platelets: 228 10*3/uL (ref 150–400)
RBC: 4.11 MIL/uL (ref 3.87–5.11)
RDW: 15.2 % (ref 11.5–15.5)
WBC: 12.7 10*3/uL — ABNORMAL HIGH (ref 4.0–10.5)

## 2013-02-17 LAB — RPR: RPR Ser Ql: NONREACTIVE

## 2013-02-17 LAB — ABO/RH: ABO/RH(D): O POS

## 2013-02-17 LAB — TYPE AND SCREEN
ABO/RH(D): O POS
Antibody Screen: NEGATIVE

## 2013-02-17 MED ORDER — LACTATED RINGERS IV SOLN
INTRAVENOUS | Status: DC
  Administered 2013-02-17: 17:00:00 via INTRAVENOUS

## 2013-02-17 MED ORDER — LIDOCAINE HCL (PF) 1 % IJ SOLN
INTRAMUSCULAR | Status: DC | PRN
  Administered 2013-02-17 (×4): 4 mL

## 2013-02-17 MED ORDER — EPHEDRINE 5 MG/ML INJ
10.0000 mg | INTRAVENOUS | Status: DC | PRN
  Filled 2013-02-17: qty 2

## 2013-02-17 MED ORDER — IBUPROFEN 600 MG PO TABS
600.0000 mg | ORAL_TABLET | Freq: Four times a day (QID) | ORAL | Status: DC | PRN
  Administered 2013-02-18: 600 mg via ORAL
  Filled 2013-02-17: qty 1

## 2013-02-17 MED ORDER — OXYTOCIN BOLUS FROM INFUSION
500.0000 mL | INTRAVENOUS | Status: DC
  Administered 2013-02-18: 500 mL via INTRAVENOUS

## 2013-02-17 MED ORDER — LACTATED RINGERS IV SOLN
500.0000 mL | Freq: Once | INTRAVENOUS | Status: AC
  Administered 2013-02-17: 500 mL via INTRAVENOUS

## 2013-02-17 MED ORDER — DIPHENHYDRAMINE HCL 50 MG/ML IJ SOLN: 12.5000 mg | INTRAMUSCULAR | Status: DC | PRN

## 2013-02-17 MED ORDER — FENTANYL 2.5 MCG/ML BUPIVACAINE 1/10 % EPIDURAL INFUSION (WH - ANES)
14.0000 mL/h | INTRAMUSCULAR | Status: DC | PRN
  Administered 2013-02-17 (×2): 14 mL/h via EPIDURAL
  Filled 2013-02-17: qty 125

## 2013-02-17 MED ORDER — ACETAMINOPHEN 325 MG PO TABS: 650.0000 mg | ORAL_TABLET | ORAL | Status: DC | PRN

## 2013-02-17 MED ORDER — PHENYLEPHRINE 40 MCG/ML (10ML) SYRINGE FOR IV PUSH (FOR BLOOD PRESSURE SUPPORT)
80.0000 ug | PREFILLED_SYRINGE | INTRAVENOUS | Status: DC | PRN
  Filled 2013-02-17: qty 2

## 2013-02-17 MED ORDER — CITRIC ACID-SODIUM CITRATE 334-500 MG/5ML PO SOLN: 30.0000 mL | ORAL | Status: DC | PRN

## 2013-02-17 MED ORDER — EPHEDRINE 5 MG/ML INJ
10.0000 mg | INTRAVENOUS | Status: DC | PRN
  Filled 2013-02-17: qty 4
  Filled 2013-02-17: qty 2

## 2013-02-17 MED ORDER — OXYTOCIN 40 UNITS IN LACTATED RINGERS INFUSION - SIMPLE MED
62.5000 mL/h | INTRAVENOUS | Status: DC
  Administered 2013-02-18: 62.5 mL/h via INTRAVENOUS
  Filled 2013-02-17: qty 1000

## 2013-02-17 MED ORDER — FLEET ENEMA 7-19 GM/118ML RE ENEM: 1.0000 | ENEMA | RECTAL | Status: DC | PRN

## 2013-02-17 MED ORDER — ONDANSETRON HCL 4 MG/2ML IJ SOLN
4.0000 mg | Freq: Four times a day (QID) | INTRAMUSCULAR | Status: DC | PRN
  Filled 2013-02-17: qty 2

## 2013-02-17 MED ORDER — BUTORPHANOL TARTRATE 1 MG/ML IJ SOLN
1.0000 mg | INTRAMUSCULAR | Status: DC | PRN
  Administered 2013-02-17: 1 mg via INTRAVENOUS
  Filled 2013-02-17: qty 1

## 2013-02-17 MED ORDER — LACTATED RINGERS IV SOLN: 500.0000 mL | INTRAVENOUS | Status: DC | PRN

## 2013-02-17 MED ORDER — PHENYLEPHRINE 40 MCG/ML (10ML) SYRINGE FOR IV PUSH (FOR BLOOD PRESSURE SUPPORT)
80.0000 ug | PREFILLED_SYRINGE | INTRAVENOUS | Status: DC | PRN
  Filled 2013-02-17: qty 5
  Filled 2013-02-17: qty 2

## 2013-02-17 MED ORDER — LIDOCAINE HCL (PF) 1 % IJ SOLN
30.0000 mL | INTRAMUSCULAR | Status: DC | PRN
  Filled 2013-02-17 (×3): qty 30

## 2013-02-17 MED ORDER — OXYCODONE-ACETAMINOPHEN 5-325 MG PO TABS: 1.0000 | ORAL_TABLET | ORAL | Status: DC | PRN

## 2013-02-17 NOTE — Anesthesia Procedure Notes (Signed)
Epidural Patient location during procedure: OB Start time: 02/17/2013 8:26 PM  Staffing Performed by: anesthesiologist   Preanesthetic Checklist Completed: patient identified, site marked, surgical consent, pre-op evaluation, timeout performed, IV checked, risks and benefits discussed and monitors and equipment checked  Epidural Patient position: sitting Prep: site prepped and draped and DuraPrep Patient monitoring: continuous pulse ox and blood pressure Approach: midline Injection technique: LOR air  Needle:  Needle type: Tuohy  Needle gauge: 17 G Needle length: 9 cm and 9 Needle insertion depth: 6.5 cm Catheter type: closed end flexible Catheter size: 19 Gauge Catheter at skin depth: 12 cm Test dose: negative  Assessment Events: blood aspirated, injection not painful, no injection resistance, positive IV test and no paresthesia  Additional Notes Discussed risk of headache, infection, bleeding, nerve injury and failed or incomplete block.  Patient voices understanding and wishes to proceed.  Epidural placed easily on second attempt.  First attempt intravascular with heme from cath and positive test dose.  Second attempt uneventful at same level.  Patient tolerated procedure well with no paresthesia.  Jasmine December, MDReason for block:procedure for pain

## 2013-02-17 NOTE — MAU Note (Signed)
Pt reports her water broke 30 min ago. Starting to feel ctx now as well. Good fetal movement felt. 2cm dilated in office.

## 2013-02-17 NOTE — Anesthesia Preprocedure Evaluation (Addendum)
Anesthesia Evaluation  Patient identified by MRN, date of birth, ID band Patient awake    Reviewed: Allergy & Precautions, H&P , NPO status , Patient's Chart, lab work & pertinent test results, reviewed documented beta blocker date and time   History of Anesthesia Complications Negative for: history of anesthetic complications  Airway Mallampati: III TM Distance: >3 FB Neck ROM: full    Dental  (+) Teeth Intact   Pulmonary neg pulmonary ROS,  breath sounds clear to auscultation        Cardiovascular negative cardio ROS  Rhythm:regular Rate:Normal     Neuro/Psych negative neurological ROS  negative psych ROS   GI/Hepatic negative GI ROS, Neg liver ROS,   Endo/Other  Morbid obesity  Renal/GU negative Renal ROS     Musculoskeletal   Abdominal   Peds  Hematology  (+) Blood dyscrasia (hgb 10.2), anemia ,   Anesthesia Other Findings   Reproductive/Obstetrics (+) Pregnancy                          Anesthesia Physical Anesthesia Plan  ASA: III  Anesthesia Plan: Epidural   Post-op Pain Management:    Induction:   Airway Management Planned:   Additional Equipment:   Intra-op Plan:   Post-operative Plan:   Informed Consent: I have reviewed the patients History and Physical, chart, labs and discussed the procedure including the risks, benefits and alternatives for the proposed anesthesia with the patient or authorized representative who has indicated his/her understanding and acceptance.     Plan Discussed with:   Anesthesia Plan Comments:         Anesthesia Quick Evaluation

## 2013-02-18 ENCOUNTER — Encounter (HOSPITAL_COMMUNITY): Payer: Self-pay | Admitting: *Deleted

## 2013-02-18 LAB — CBC
HCT: 30.4 % — ABNORMAL LOW (ref 36.0–46.0)
Hemoglobin: 9.8 g/dL — ABNORMAL LOW (ref 12.0–15.0)
MCH: 25.3 pg — ABNORMAL LOW (ref 26.0–34.0)
MCHC: 32.2 g/dL (ref 30.0–36.0)
MCV: 78.4 fL (ref 78.0–100.0)
Platelets: 227 10*3/uL (ref 150–400)
RBC: 3.88 MIL/uL (ref 3.87–5.11)
RDW: 15.4 % (ref 11.5–15.5)
WBC: 18.3 10*3/uL — ABNORMAL HIGH (ref 4.0–10.5)

## 2013-02-18 MED ORDER — ZOLPIDEM TARTRATE 5 MG PO TABS: 5.0000 mg | ORAL_TABLET | Freq: Every evening | ORAL | Status: DC | PRN

## 2013-02-18 MED ORDER — ONDANSETRON HCL 4 MG PO TABS: 4.0000 mg | ORAL_TABLET | ORAL | Status: DC | PRN

## 2013-02-18 MED ORDER — LANOLIN HYDROUS EX OINT: TOPICAL_OINTMENT | CUTANEOUS | Status: DC | PRN

## 2013-02-18 MED ORDER — OXYCODONE-ACETAMINOPHEN 5-325 MG PO TABS
1.0000 | ORAL_TABLET | ORAL | Status: DC | PRN
  Administered 2013-02-18: 1 via ORAL
  Filled 2013-02-18: qty 1

## 2013-02-18 MED ORDER — FERROUS SULFATE 325 (65 FE) MG PO TABS
325.0000 mg | ORAL_TABLET | Freq: Two times a day (BID) | ORAL | Status: DC
  Administered 2013-02-18 – 2013-02-19 (×3): 325 mg via ORAL
  Filled 2013-02-18 (×3): qty 1

## 2013-02-18 MED ORDER — TETANUS-DIPHTH-ACELL PERTUSSIS 5-2.5-18.5 LF-MCG/0.5 IM SUSP
0.5000 mL | Freq: Once | INTRAMUSCULAR | Status: AC
  Administered 2013-02-19: 0.5 mL via INTRAMUSCULAR
  Filled 2013-02-18: qty 0.5

## 2013-02-18 MED ORDER — WITCH HAZEL-GLYCERIN EX PADS: 1.0000 "application " | MEDICATED_PAD | CUTANEOUS | Status: DC | PRN

## 2013-02-18 MED ORDER — IBUPROFEN 600 MG PO TABS
600.0000 mg | ORAL_TABLET | Freq: Four times a day (QID) | ORAL | Status: DC
  Administered 2013-02-18: 600 mg via ORAL
  Filled 2013-02-18: qty 1

## 2013-02-18 MED ORDER — BENZOCAINE-MENTHOL 20-0.5 % EX AERO: 1.0000 "application " | INHALATION_SPRAY | CUTANEOUS | Status: DC | PRN

## 2013-02-18 MED ORDER — SENNOSIDES-DOCUSATE SODIUM 8.6-50 MG PO TABS
2.0000 | ORAL_TABLET | Freq: Every day | ORAL | Status: DC
  Administered 2013-02-18: 2 via ORAL

## 2013-02-18 MED ORDER — SIMETHICONE 80 MG PO CHEW: 80.0000 mg | CHEWABLE_TABLET | ORAL | Status: DC | PRN

## 2013-02-18 MED ORDER — PRENATAL MULTIVITAMIN CH
1.0000 | ORAL_TABLET | Freq: Every day | ORAL | Status: DC
  Administered 2013-02-18: 1 via ORAL
  Filled 2013-02-18: qty 1

## 2013-02-18 MED ORDER — DIPHENHYDRAMINE HCL 25 MG PO CAPS: 25.0000 mg | ORAL_CAPSULE | Freq: Four times a day (QID) | ORAL | Status: DC | PRN

## 2013-02-18 MED ORDER — IBUPROFEN 100 MG/5ML PO SUSP
600.0000 mg | Freq: Four times a day (QID) | ORAL | Status: DC
  Administered 2013-02-18 – 2013-02-19 (×3): 600 mg via ORAL
  Filled 2013-02-18 (×5): qty 30

## 2013-02-18 MED ORDER — DIBUCAINE 1 % RE OINT: 1.0000 "application " | TOPICAL_OINTMENT | RECTAL | Status: DC | PRN

## 2013-02-18 MED ORDER — ONDANSETRON HCL 4 MG/2ML IJ SOLN: 4.0000 mg | INTRAMUSCULAR | Status: DC | PRN

## 2013-02-18 NOTE — Progress Notes (Signed)
Patient ID: Melissa Diaz, female   DOB: 04/22/1991, 22 y.o.   MRN: [id number] Postpartum day 0 Vital signs normal Fundus firm Lochia moderate Legs negative doing well

## 2013-02-18 NOTE — Progress Notes (Signed)
UR chart review completed.  

## 2013-02-18 NOTE — H&P (Signed)
This is Dr. Francoise Ceo dictating the history and physical on blank blank she's a 22 year old gravida 2 para 1001 negative GBS at 620 09/18/1937 weeks and 2 days admitted in labor with ruptured membranes which occurred at 11:30 AM yesterday morning the patient progressed slowly and had a normal vaginal delivery of a female Apgar 8 and 9 no episiotomy or laceration the placenta was spontaneous intact Past medical history negative Past surgical history negative Social history negative System review noncontributory Physical exam a well-developed female postpartum HEENT negative Lungs clear to P&A Heart regular rhythm Abdomen uterus 20 week size postpartum Legs negative

## 2013-02-18 NOTE — Anesthesia Postprocedure Evaluation (Signed)
  Anesthesia Post-op Note  Patient: Melissa Diaz  Procedure(s) Performed: * No procedures listed *  Patient Location: Mother/Baby  Anesthesia Type:Epidural  Level of Consciousness: awake, alert , oriented and patient cooperative  Airway and Oxygen Therapy: Patient Spontanous Breathing  Post-op Pain: mild  Post-op Assessment: Patient's Cardiovascular Status Stable and Respiratory Function Stable  Post-op Vital Signs: stable  Complications: No apparent anesthesia complications

## 2013-02-19 NOTE — Discharge Instructions (Signed)
Discharge instructions   You can wash your hair  Shower  Eat what you want  Drink what you want  See me in 6 weeks  Your ankles are going to swell more in the next 2 weeks than when pregnant  No sex for 6 weeks   Sanaya Gwilliam A, MD 02/19/2013

## 2013-02-19 NOTE — Discharge Summary (Signed)
Obstetric Discharge Summary Reason for Admission: onset of labor Prenatal Procedures: none Intrapartum Procedures: spontaneous vaginal delivery Postpartum Procedures: none Complications-Operative and Postpartum: none Hemoglobin  Date Value Range Status  02/18/2013 9.8* 12.0 - 15.0 g/dL Final     HCT  Date Value Range Status  02/18/2013 30.4* 36.0 - 46.0 % Final    Physical Exam:  General: alert Lochia: appropriate Uterine Fundus: firm Incision: healing well DVT Evaluation: No evidence of DVT seen on physical exam.  Discharge Diagnoses: Term Pregnancy-delivered  Discharge Information: Date: 02/19/2013 Activity: pelvic rest Diet: routine Medications: Percocet Condition: stable Instructions: refer to practice specific booklet Discharge to: home Follow-up Information   Follow up with Monserrate Blaschke A, MD. Schedule an appointment as soon as possible for a visit in 6 weeks.   Contact information:   [address] [place] [address] [phone]       Newborn Data: Live born female  Birth Weight: 7 lb 8.6 oz (3420 g) APGAR: 8, 9  Home with mother.  Kysen Wetherington A 02/19/2013, 6:44 AM

## 2013-02-19 NOTE — Progress Notes (Signed)
Patient ID: Melissa Diaz, female   DOB: [birth year], 22 y.o.   MRN: [id number] Postpartum day one Vital signs normal Fundus firm Lochia moderate Legs negative patient desires discharge home today

## 2013-02-23 ENCOUNTER — Inpatient Hospital Stay (HOSPITAL_COMMUNITY): Admission: AD | Admit: 2013-02-23 | Payer: Self-pay | Source: Ambulatory Visit | Admitting: Obstetrics

## 2013-03-08 ENCOUNTER — Emergency Department (HOSPITAL_COMMUNITY)
Admission: EM | Admit: 2013-03-08 | Discharge: 2013-03-09 | Disposition: A | Discharge status: Home / Self Care | Payer: Self-pay | Attending: Emergency Medicine | Admitting: Emergency Medicine

## 2013-03-08 ENCOUNTER — Encounter (HOSPITAL_COMMUNITY): Payer: Self-pay | Admitting: *Deleted

## 2013-03-08 DIAGNOSIS — A599 Trichomoniasis, unspecified: Secondary | ICD-10-CM

## 2013-03-08 DIAGNOSIS — R112 Nausea with vomiting, unspecified: Secondary | ICD-10-CM | POA: Insufficient documentation

## 2013-03-08 DIAGNOSIS — R61 Generalized hyperhidrosis: Secondary | ICD-10-CM | POA: Insufficient documentation

## 2013-03-08 DIAGNOSIS — N898 Other specified noninflammatory disorders of vagina: Secondary | ICD-10-CM | POA: Insufficient documentation

## 2013-03-08 DIAGNOSIS — R000 Tachycardia, unspecified: Secondary | ICD-10-CM | POA: Insufficient documentation

## 2013-03-08 DIAGNOSIS — B3731 Acute candidiasis of vulva and vagina: Secondary | ICD-10-CM | POA: Insufficient documentation

## 2013-03-08 DIAGNOSIS — R350 Frequency of micturition: Secondary | ICD-10-CM | POA: Insufficient documentation

## 2013-03-08 DIAGNOSIS — R3915 Urgency of urination: Secondary | ICD-10-CM | POA: Insufficient documentation

## 2013-03-08 DIAGNOSIS — M545 Low back pain, unspecified: Secondary | ICD-10-CM | POA: Insufficient documentation

## 2013-03-08 DIAGNOSIS — Z3202 Encounter for pregnancy test, result negative: Secondary | ICD-10-CM | POA: Insufficient documentation

## 2013-03-08 DIAGNOSIS — R109 Unspecified abdominal pain: Secondary | ICD-10-CM | POA: Insufficient documentation

## 2013-03-08 DIAGNOSIS — B373 Candidiasis of vulva and vagina: Secondary | ICD-10-CM | POA: Insufficient documentation

## 2013-03-08 DIAGNOSIS — R509 Fever, unspecified: Secondary | ICD-10-CM | POA: Insufficient documentation

## 2013-03-08 DIAGNOSIS — R319 Hematuria, unspecified: Secondary | ICD-10-CM | POA: Insufficient documentation

## 2013-03-08 DIAGNOSIS — R809 Proteinuria, unspecified: Secondary | ICD-10-CM | POA: Insufficient documentation

## 2013-03-08 LAB — URINALYSIS, ROUTINE W REFLEX MICROSCOPIC
Nitrite: NEGATIVE
Specific Gravity, Urine: 1.028 (ref 1.005–1.030)
Urobilinogen, UA: 2 mg/dL — ABNORMAL HIGH (ref 0.0–1.0)

## 2013-03-08 LAB — URINE MICROSCOPIC-ADD ON

## 2013-03-08 MED ORDER — SODIUM CHLORIDE 0.9 % IV BOLUS (SEPSIS)
500.0000 mL | Freq: Once | INTRAVENOUS | Status: AC
  Administered 2013-03-08: 500 mL via INTRAVENOUS

## 2013-03-08 MED ORDER — MORPHINE SULFATE 4 MG/ML IJ SOLN
4.0000 mg | Freq: Once | INTRAMUSCULAR | Status: AC
  Administered 2013-03-08: 4 mg via INTRAVENOUS
  Filled 2013-03-08: qty 1

## 2013-03-08 MED ORDER — HYDROCODONE-ACETAMINOPHEN 5-325 MG PO TABS: 2.0000 | ORAL_TABLET | Freq: Once | ORAL | Status: DC

## 2013-03-08 NOTE — ED Notes (Signed)
i saw this pt  At triage no change in the pts condition

## 2013-03-08 NOTE — ED Provider Notes (Signed)
History    CSN: [id number] Arrival date & time 03/08/13  2240  First MD Initiated Contact with Patient 03/08/13 2308     Chief Complaint  Patient presents with  . Dysuria   (Consider location/radiation/quality/duration/timing/severity/associated sxs/prior Treatment) HPI Comments: 22 year old healthy female presents to the emergency department complaining of lower back pain beginning last night. Back pain described as sharp, radiating around her sides to her suprapubic region rated 9/10. She has not tried any alleviating factors for her pain. This morning when she woke up she had an episode of dysuria. Admits to increased urinary urgency, however is afraid to use the bathroom because of a burning sensation with urination. Admits to associated nausea over the past couple days with one episode of vomiting. Last night she had the cold sweats with a temperature of 102. Prior to onset of symptoms denies any vaginal discharge or bleeding. She began her menstrual cycle yesterday. She is sexually active with one partner but does not use protection.  Patient is a 22 y.o. female presenting with dysuria. The history is provided by the patient.  Dysuria Associated symptoms: abdominal pain, fever, nausea and vomiting   Associated symptoms: no vaginal discharge    History reviewed. No pertinent past medical history. History reviewed. No pertinent past surgical history. No family history on file. History  Substance Use Topics  . Smoking status: Never Smoker   . Smokeless tobacco: Not on file  . Alcohol Use: No   OB History   Grav Para Term Preterm Abortions TAB SAB Ect Mult Living                 Review of Systems  Constitutional: Positive for fever, chills and diaphoresis.  Respiratory: Negative for shortness of breath.   Cardiovascular: Negative for chest pain.  Gastrointestinal: Positive for nausea, vomiting and abdominal pain.  Genitourinary: Positive for dysuria, urgency and frequency.  Negative for vaginal bleeding, vaginal discharge and pelvic pain.  Musculoskeletal: Positive for back pain.  All other systems reviewed and are negative.    Allergies  Coconut oil and Pineapple  Home Medications  No current outpatient prescriptions on file. BP 134/72  Pulse 134  Temp(Src) 99 F (37.2 C) (Oral)  Resp 20  SpO2 98%  LMP 03/08/2013 Physical Exam  Nursing note and vitals reviewed. Constitutional: She is oriented to person, place, and time. She appears well-developed and well-nourished. No distress.  HENT:  Head: Normocephalic and atraumatic.  Mouth/Throat: Oropharynx is clear and moist.  Eyes: Conjunctivae are normal. No scleral icterus.  Neck: Normal range of motion. Neck supple.  Cardiovascular: Regular rhythm and normal heart sounds.  Tachycardia present.   Pulmonary/Chest: Effort normal and breath sounds normal. No respiratory distress. She has no wheezes. She has no rales.  Abdominal: Soft. Normal appearance and bowel sounds are normal. There is tenderness in the suprapubic area and left lower quadrant. There is no rigidity, no rebound, no guarding and no CVA tenderness.  Genitourinary: Uterus normal. Uterus is not deviated, not enlarged, not fixed and not tender. Cervix exhibits discharge (dark red clotting consistent with menstrual blood). Cervix exhibits no motion tenderness and no friability. Right adnexum displays no mass, no tenderness and no fullness. Left adnexum displays no mass, no tenderness and no fullness. There is bleeding (dark red clotting consistent with menstrual blood) around the vagina. No erythema or tenderness around the vagina. No vaginal discharge found.  Musculoskeletal: Normal range of motion. She exhibits no edema.  Neurological: She is alert and  oriented to person, place, and time.  Skin: Skin is warm and dry. She is not diaphoretic.  Psychiatric: She has a normal mood and affect. Her behavior is normal.    ED Course  Procedures  (including critical care time) Labs Reviewed  URINALYSIS, ROUTINE W REFLEX MICROSCOPIC - Abnormal; Notable for the following:    Color, Urine RED (*)    APPearance CLOUDY (*)    Hgb urine dipstick LARGE (*)    Bilirubin Urine SMALL (*)    Ketones, ur 15 (*)    Protein, ur 30 (*)    Urobilinogen, UA 2.0 (*)    Leukocytes, UA LARGE (*)    All other components within normal limits  URINE MICROSCOPIC-ADD ON - Abnormal; Notable for the following:    Bacteria, UA FEW (*)    All other components within normal limits  URINE CULTURE  PREGNANCY, URINE   No results found. No diagnosis found.  MDM  Patient with lower back pain, left-sided tenderness, suprapubic tenderness on exam. No CVA tenderness. Urinalysis showing Trichomonas. I discussed this with patient and asked if she was concerned about sexually transmitted diseases. Unsure whether or not her sexual partner has other partners. Will perform a pelvic exam and obtain cultures. Tachycardic 134, temperature 99. Normotensive, normal respirations. 12:05 AM Pelvic exam with dark red blood consistent with menstrual clotting. No CMT or adnexal tenderness. Hematuria on UA most likely from menstrual blood vs kidney stone. Concern for pyelonephritis vs TOA. Obtaining CT abdomen/pelvis with contrast. Rectal temp 101.5. Prophylactic treatment for GC/chlamydia with rocephin and azithromycin. Patient has IV- will give first dose of flagyl for trich IV. Patient discussed with Dr. Hyacinth Meeker who agrees with plan of care. 12:58 AM Patient care will be assumed by Dr. Hyacinth Meeker at shift change.  Trevor Mace, PA-C 03/09/13 [phone]

## 2013-03-08 NOTE — ED Notes (Signed)
The pt has had  Painful urination and chilld sinc this am early  She is on her period.  lmp now

## 2013-03-09 ENCOUNTER — Emergency Department (HOSPITAL_COMMUNITY): Payer: Self-pay

## 2013-03-09 LAB — WET PREP, GENITAL

## 2013-03-09 MED ORDER — ONDANSETRON HCL 4 MG/2ML IJ SOLN
4.0000 mg | Freq: Once | INTRAMUSCULAR | Status: AC
Start: 2013-03-09 — End: 2013-03-09
  Administered 2013-03-09: 4 mg via INTRAVENOUS
  Filled 2013-03-09: qty 2

## 2013-03-09 MED ORDER — METRONIDAZOLE 500 MG PO TABS: 500.0000 mg | ORAL_TABLET | Freq: Two times a day (BID) | ORAL | Status: DC

## 2013-03-09 MED ORDER — IOHEXOL 300 MG/ML  SOLN
100.0000 mL | Freq: Once | INTRAMUSCULAR | Status: AC | PRN
  Administered 2013-03-09: 100 mL via INTRAVENOUS

## 2013-03-09 MED ORDER — LIDOCAINE HCL (PF) 1 % IJ SOLN
INTRAMUSCULAR | Status: AC
  Administered 2013-03-09: 1.2 mL
  Filled 2013-03-09: qty 5

## 2013-03-09 MED ORDER — CEFTRIAXONE SODIUM 250 MG IJ SOLR
250.0000 mg | Freq: Once | INTRAMUSCULAR | Status: AC
  Administered 2013-03-09: 250 mg via INTRAMUSCULAR
  Filled 2013-03-09: qty 250

## 2013-03-09 MED ORDER — SODIUM CHLORIDE 0.9 % IV BOLUS (SEPSIS)
1000.0000 mL | Freq: Once | INTRAVENOUS | Status: AC
  Administered 2013-03-09: 1000 mL via INTRAVENOUS

## 2013-03-09 MED ORDER — METRONIDAZOLE IN NACL 5-0.79 MG/ML-% IV SOLN
500.0000 mg | Freq: Once | INTRAVENOUS | Status: AC
  Administered 2013-03-09: 500 mg via INTRAVENOUS
  Filled 2013-03-09: qty 100

## 2013-03-09 MED ORDER — IOHEXOL 300 MG/ML  SOLN
25.0000 mL | Freq: Once | INTRAMUSCULAR | Status: AC | PRN
  Administered 2013-03-09: 25 mL via ORAL

## 2013-03-09 MED ORDER — AZITHROMYCIN 250 MG PO TABS
1000.0000 mg | ORAL_TABLET | Freq: Once | ORAL | Status: AC
  Administered 2013-03-09: 1000 mg via ORAL
  Filled 2013-03-09: qty 4

## 2013-03-09 MED ORDER — NAPROXEN 500 MG PO TABS: 500.0000 mg | ORAL_TABLET | Freq: Two times a day (BID) | ORAL | Status: DC

## 2013-03-09 MED ORDER — DOXYCYCLINE HYCLATE 100 MG PO CAPS: 100.0000 mg | ORAL_CAPSULE | Freq: Two times a day (BID) | ORAL | Status: DC

## 2013-03-09 MED ORDER — ACETAMINOPHEN 500 MG PO TABS
1000.0000 mg | ORAL_TABLET | Freq: Once | ORAL | Status: AC
  Administered 2013-03-09: 1000 mg via ORAL
  Filled 2013-03-09: qty 2

## 2013-03-09 NOTE — ED Notes (Signed)
The pt has finished her oral contrast.  Her antibiotics will be given after the pt finishes her c-t.  The pa is aware

## 2013-03-09 NOTE — ED Provider Notes (Signed)
At the time of my evaluation the patient is well-appearing, has no complaints including no fever, no vomiting, no pain. On my exam she has clear heart and lung sounds, borderline tachycardia, soft abdomen with no tenderness or guarding. The patient has abnormal laboratory tests including trichomonas, possible urinary infection and proteinuria. I discussed these findings with the patient, she will followup with her family doctor to to ensure resolution.  Medical screening examination/treatment/procedure(s) were conducted as a shared visit with non-physician practitioner(s) and myself.  I personally evaluated the patient during the encounter    Vida Roller, MD 03/09/13 [phone]

## 2013-03-09 NOTE — ED Notes (Signed)
Patient reports feeling nauseated.  

## 2013-03-11 LAB — URINE CULTURE: Colony Count: >=100000

## 2013-03-12 NOTE — Progress Notes (Signed)
ED Antimicrobial Stewardship Positive Culture Follow Up   Natasha Bernard is an 22 y.o. female who presented to [facility] on 03/08/2013 with a chief complaint of dysuira, painful urination, lower back pain  Chief Complaint  Patient presents with  . Dysuria    Recent Results (from the past 720 hour(s))  URINE CULTURE     Status: None   Collection Time    03/08/13 10:48 PM      Result Value Range Status   Specimen Description URINE, CLEAN CATCH   Final   Special Requests NONE   Final   Culture  Setup Time 03/09/2013 15:31   Final   Colony Count >=100,000 COLONIES/ML   Final   Culture ESCHERICHIA COLI   Final   Report Status 03/11/2013 FINAL   Final   Organism ID, Bacteria ESCHERICHIA COLI   Final  GC/CHLAMYDIA PROBE AMP     Status: None   Collection Time    03/09/13 12:09 AM      Result Value Range Status   CT Probe RNA NEGATIVE  NEGATIVE Final   GC Probe RNA NEGATIVE  NEGATIVE Final   Comment: (NOTE)                                                                                             **Normal Reference Range: Negative**          Assay performed using the Gen-Probe APTIMA COMBO2 (R) Assay.     Acceptable specimen types for this assay include APTIMA Swabs (Unisex,     endocervical, urethral, or vaginal), first void urine, and ThinPrep     liquid based cytology samples.  WET PREP, GENITAL     Status: Abnormal   Collection Time    03/09/13 12:09 AM      Result Value Range Status   Yeast Wet Prep HPF POC NONE SEEN  NONE SEEN Final   Trich, Wet Prep MODERATE (*) NONE SEEN Final   Clue Cells Wet Prep HPF POC FEW (*) NONE SEEN Final   WBC, Wet Prep HPF POC MODERATE (*) NONE SEEN Final     []  Treated with , organism resistant to prescribed antimicrobial [x]  Patient discharged originally without antimicrobial agent and treatment is now indicated  The patient was treated for Trich and cervicitis -- however did not receive any antibiotics for a UTI -- would recommend also  treating this.  New antibiotic prescription: Keflex 500 mg bid x 10 days -- continue Flagyl and Doxy as prescribed  ED Provider: Ebbie Ridge, PA-C   Natasha Bernard 03/12/2013, 10:00 AM Infectious Diseases Pharmacist Phone# [phone]

## 2013-03-12 NOTE — ED Notes (Signed)
Post ED Visit - Positive Culture Follow-up: Successful Patient Follow-Up  Culture assessed and recommendations reviewed by: []  Wes Dulaney, Pharm.D., BCPS []  Celedonio Miyamoto, Pharm.D., BCPS [x]  Georgina Pillion, Pharm.D., BCPS []  [place], [place].D., BCPS, AAHIVP []  Estella Husk, Pharm.D., BCPS, AAHIVP  Positive urine culture  []  Patient discharged without antimicrobial prescription and treatment is now indicated [x]  Organism is resistant to prescribed ED discharge antimicrobial []  Patient with positive blood cultures  Changes discussed with ED provider: Ebbie Ridge   New prescription: Kelfex 500 mg BID x 10 days continue  flagyl /doxy as prescribed.  Larena Sox 03/12/2013, 1:00 PM

## 2013-03-13 ENCOUNTER — Telehealth (HOSPITAL_COMMUNITY): Payer: Self-pay | Admitting: *Deleted

## 2013-03-13 NOTE — ED Notes (Signed)
Patient requests that rx be called to [facility] [phone]

## 2013-09-05 NOTE — L&D Delivery Note (Signed)
2025: Call from nurse reporting increased rectal pressure and urge to push.  Nurse stating infant hair at introitus.  Provider to room and patient C/C/+3.  Room prepped for delivery and patient instructed on pushing techniques.  Infant with Cat I FT throughout pushing efforts.  Mother delivered as below with family and staff support.   Delivery Note At 9:03 PM, on Dec. 24, 2015, a viable female "[name]" was delivered via Vaginal, Spontaneous Delivery (Presentation: Direct Occiput Anterior).  After delivery of head, nuchal cord noted, which infant was delivered through via somersault maneuver. Infant with good tone and spontaneous cry.  Provider provided tactile stimulation and infant placed on mother's abdomen.  Nurse continued to provide tactile stimulation and infant APGARs: 9, 9. Cord clamped, cut, and blood collected. Placenta delivered spontaneously and noted to be intact with 3VC upon inspection.  Vaginal inspection revealed a 1st degree laceration, that was repaired without incident.  Patient also noted to have some labial abrasions that were hemostatic.  Fundus firm, at the umbilicus, and bleeding small.  Mother hemodynamically stable and infant skin to skin prior to provider exit.  Mother desires Mirena for birth control method and opts to breastfeed.  Family wishes for infant to be circumcised in outpatient setting. Infant weight at one hour of life: 7lbs 8oz  Anesthesia: Epidural, Local for Repair Episiotomy:  None Lacerations: 1st degree Suture Repair: 3.0 vicryl vicryl rapide Est. Blood Loss (mL):  200  Mom to postpartum.  Baby to Couplet care / Skin to Skin.   Sedalia Muta MSN, CNM 08/28/2014, 9:53 PM

## 2014-01-15 LAB — OB RESULTS CONSOLE HEPATITIS B SURFACE ANTIGEN: Hepatitis B Surface Ag: NEGATIVE

## 2014-01-15 LAB — OB RESULTS CONSOLE GC/CHLAMYDIA
Chlamydia: NEGATIVE
Gonorrhea: NEGATIVE

## 2014-01-15 LAB — OB RESULTS CONSOLE HIV ANTIBODY (ROUTINE TESTING): HIV: NONREACTIVE

## 2014-01-15 LAB — OB RESULTS CONSOLE RPR: RPR: NONREACTIVE

## 2014-01-15 LAB — OB RESULTS CONSOLE RUBELLA ANTIBODY, IGM: Rubella: IMMUNE

## 2014-01-27 LAB — OB RESULTS CONSOLE ABO/RH: RH Type: POSITIVE

## 2014-03-02 ENCOUNTER — Emergency Department (HOSPITAL_COMMUNITY)
Admission: EM | Admit: 2014-03-02 | Discharge: 2014-03-02 | Disposition: A | Discharge status: Home / Self Care | Payer: 59 | Source: Home / Self Care | Attending: Emergency Medicine | Admitting: Emergency Medicine

## 2014-03-02 ENCOUNTER — Encounter (HOSPITAL_COMMUNITY): Payer: Self-pay | Admitting: Emergency Medicine

## 2014-03-02 DIAGNOSIS — L508 Other urticaria: Secondary | ICD-10-CM | POA: Diagnosis not present

## 2014-03-02 MED ORDER — PREDNISONE 20 MG PO TABS
60.0000 mg | ORAL_TABLET | Freq: Once | ORAL | Status: AC
  Administered 2014-03-02: 60 mg via ORAL

## 2014-03-02 MED ORDER — PREDNISONE 20 MG PO TABS: ORAL_TABLET | ORAL | Status: DC

## 2014-03-02 MED ORDER — METHYLPREDNISOLONE ACETATE 80 MG/ML IJ SUSP
80.0000 mg | Freq: Once | INTRAMUSCULAR | Status: AC
  Administered 2014-03-02: 80 mg via INTRAMUSCULAR

## 2014-03-02 MED ORDER — METHYLPREDNISOLONE ACETATE 80 MG/ML IJ SUSP
INTRAMUSCULAR | Status: AC
  Filled 2014-03-02: qty 1

## 2014-03-02 MED ORDER — HYDROXYZINE HCL 25 MG PO TABS: 25.0000 mg | ORAL_TABLET | Freq: Three times a day (TID) | ORAL | Status: DC

## 2014-03-02 MED ORDER — DIPHENHYDRAMINE HCL 50 MG/ML IJ SOLN
50.0000 mg | Freq: Once | INTRAMUSCULAR | Status: AC
  Administered 2014-03-02: 50 mg via INTRAMUSCULAR

## 2014-03-02 MED ORDER — PREDNISONE 20 MG PO TABS
ORAL_TABLET | ORAL | Status: AC
  Filled 2014-03-02: qty 3

## 2014-03-02 MED ORDER — EPINEPHRINE 0.3 MG/0.3ML IJ SOAJ: 0.3000 mg | Freq: Once | INTRAMUSCULAR | Status: DC

## 2014-03-02 MED ORDER — RANITIDINE HCL 150 MG PO TABS: 150.0000 mg | ORAL_TABLET | Freq: Two times a day (BID) | ORAL | Status: DC

## 2014-03-02 MED ORDER — EPINEPHRINE HCL 1 MG/ML IJ SOLN
0.3000 mg | Freq: Once | INTRAMUSCULAR | Status: AC
  Administered 2014-03-02: 0.3 mg via INTRAMUSCULAR

## 2014-03-02 MED ORDER — DIPHENHYDRAMINE HCL 50 MG/ML IJ SOLN
INTRAMUSCULAR | Status: AC
  Filled 2014-03-02: qty 1

## 2014-03-02 MED ORDER — EPINEPHRINE HCL 1 MG/ML IJ SOLN
INTRAMUSCULAR | Status: AC
  Filled 2014-03-02: qty 1

## 2014-03-02 NOTE — Discharge Instructions (Signed)
Hives Hives are itchy, red, swollen areas of the skin. They can vary in size and location on your body. Hives can come and go for hours or several days (acute hives) or for several weeks (chronic hives). Hives do not spread from person to person (noncontagious). They may get worse with scratching, exercise, and emotional stress. CAUSES   Allergic reaction to food, additives, or drugs.  Infections, including the common cold.  Illness, such as vasculitis, lupus, or thyroid disease.  Exposure to sunlight, heat, or cold.  Exercise.  Stress.  Contact with chemicals. SYMPTOMS   Red or white swollen patches on the skin. The patches may change size, shape, and location quickly and repeatedly.  Itching.  Swelling of the hands, feet, and face. This may occur if hives develop deeper in the skin. DIAGNOSIS  Your caregiver can usually tell what is wrong by performing a physical exam. Skin or blood tests may also be done to determine the cause of your hives. In some cases, the cause cannot be determined. TREATMENT  Mild cases usually get better with medicines such as antihistamines. Severe cases may require an emergency epinephrine injection. If the cause of your hives is known, treatment includes avoiding that trigger.  HOME CARE INSTRUCTIONS   Avoid causes that trigger your hives.  Take antihistamines as directed by your caregiver to reduce the severity of your hives. Non-sedating or low-sedating antihistamines are usually recommended. Do not drive while taking an antihistamine.  Take any other medicines prescribed for itching as directed by your caregiver.  Wear loose-fitting clothing.  Keep all follow-up appointments as directed by your caregiver. SEEK MEDICAL CARE IF:   You have persistent or severe itching that is not relieved with medicine.  You have painful or swollen joints. SEEK IMMEDIATE MEDICAL CARE IF:   You have a fever.  Your tongue or lips are swollen.  You have  trouble breathing or swallowing.  You feel tightness in the throat or chest.  You have abdominal pain. These problems may be the first sign of a life-threatening allergic reaction. Call your local emergency services (911 in [place] MAKE SURE YOU:   Understand these instructions.  Will watch your condition.  Will get help right away if you are not doing well or get worse. Document Released: 08/22/2005 Document Revised: 08/27/2013 Document Reviewed: 11/15/2011 Exit[employer] Patient Information 2015 [employer]. This information is not intended to replace advice given to you by your health care provider. Make sure you discuss any questions you have with your health care provider.  

## 2014-03-02 NOTE — ED Notes (Signed)
Reports breaking out in hives for the past 8 days.  With irritation.  Mild swelling of lips.  Denies sob and swelling of throat.  No treatments tried.  On new medication started was loratadine.  No other changes.

## 2014-03-02 NOTE — ED Provider Notes (Signed)
Chief Complaint    Chief Complaint  Patient presents with  . Urticaria    History of Present Illness      Melissa Diaz is a 23 year old female who has had a 4 year history of recurring urticaria. At first she thought this might be due to drinking soda or acidic drinks. She's stopped both of these and only drinks milk and water right now. Despite this she still has recurring attacks of urticaria. The current attack began when she finished a course of steroids for previous attack, about 8 days ago. She notes generalized hives and itching. She's had some swelling of her lips and nose. No swelling of the tongue or throat. She denies any difficulty breathing, wheezing, or tightness in her chest. She's been taking Zyrtec and loratadine without any improvement.  Review of Systems   Other than as noted above, the patient denies any of the following symptoms: Systemic:  No fever or chills. ENT:  No nasal congestion, rhinorrhea, sore throat, swelling of lips, tongue or throat. Resp:  No cough, wheezing, or shortness of breath.  PMFSH    Past medical history, family history, social history, meds, and allergies were reviewed.   Physical Exam     Vital signs:  BP 129/69  Pulse 108  Temp(Src) 98.6 F (37 C) (Oral)  Resp 20  SpO2 100%  LMP 02/23/2014  Breastfeeding? No Gen:  Alert, oriented, in no distress. ENT:  Pharynx clear, no intraoral lesions, moist mucous membranes. Lungs:  Clear to auscultation. Skin:  She's covered with urticarial lesions on her trunk, arms, legs, and even on her face.  Course in Urgent Care Center     The following meds were given:  Medications  predniSONE (DELTASONE) tablet 60 mg (60 mg Oral Given 03/02/14 1620)  methylPREDNISolone acetate (DEPO-MEDROL) injection 80 mg (80 mg Intramuscular Given 03/02/14 1620)  diphenhydrAMINE (BENADRYL) injection 50 mg (50 mg Intramuscular Given 03/02/14 1621)  EPINEPHrine (ADRENALIN) injection 0.3 mg (0.3 mg  Intramuscular Given 03/02/14 1621)    Her urticaria improves slightly with the above medications.  Assessment    The encounter diagnosis was Chronic urticaria.  She'll need followup with an allergist.  Plan     1.  Meds:  The following meds were prescribed:   Discharge Medication List as of 03/02/2014  5:07 PM    START taking these medications   Details  EPINEPHrine (EPIPEN) 0.3 mg/0.3 mL IJ SOAJ injection Inject 0.3 mLs (0.3 mg total) into the skin once., Starting 03/02/2014, Normal    hydrOXYzine (ATARAX/VISTARIL) 25 MG tablet Take 1 tablet (25 mg total) by mouth 3 (three) times daily., Starting 03/02/2014, Until Discontinued, Normal    predniSONE (DELTASONE) 20 MG tablet Take 3 daily for 5 days, 2 daily for 5 days, 1 daily for 5 days., Normal    ranitidine (ZANTAC) 150 MG tablet Take 1 tablet (150 mg total) by mouth 2 (two) times daily., Starting 03/02/2014, Until Discontinued, Normal        2.  Patient Education/Counseling:  The patient was given appropriate handouts, self care instructions, and instructed in symptomatic relief.  Followup with Dr. [place] CallasSharma.  3.  Follow up:  The patient was told to follow up here if no better in 3 to 4 days, or sooner if becoming worse in any way, and given some red flag symptoms such as worsening rash, fever, or difficulty breathing which would prompt immediate return.  Follow up here if necessary.      Dineen Kidavid C  Lorenz CoasterKeller, MD 03/02/14 2206

## 2014-05-29 ENCOUNTER — Encounter (HOSPITAL_COMMUNITY): Payer: Self-pay

## 2014-05-29 ENCOUNTER — Inpatient Hospital Stay (HOSPITAL_COMMUNITY)
Admission: AD | Admit: 2014-05-29 | Discharge: 2014-06-01 | DRG: 778 | Disposition: A | Discharge status: Home / Self Care | Payer: Medicaid Other | Source: Ambulatory Visit | Attending: Obstetrics and Gynecology | Admitting: Obstetrics and Gynecology

## 2014-05-29 DIAGNOSIS — D259 Leiomyoma of uterus, unspecified: Secondary | ICD-10-CM | POA: Diagnosis present

## 2014-05-29 DIAGNOSIS — A498 Other bacterial infections of unspecified site: Secondary | ICD-10-CM | POA: Diagnosis present

## 2014-05-29 DIAGNOSIS — O3410 Maternal care for benign tumor of corpus uteri, unspecified trimester: Secondary | ICD-10-CM | POA: Diagnosis present

## 2014-05-29 DIAGNOSIS — Q5210 Doubling of vagina, unspecified: Secondary | ICD-10-CM

## 2014-05-29 DIAGNOSIS — IMO0002 Reserved for concepts with insufficient information to code with codable children: Secondary | ICD-10-CM | POA: Diagnosis not present

## 2014-05-29 DIAGNOSIS — Z22330 Carrier of Group B streptococcus: Secondary | ICD-10-CM

## 2014-05-29 DIAGNOSIS — B9620 Unspecified Escherichia coli [E. coli] as the cause of diseases classified elsewhere: Secondary | ICD-10-CM | POA: Diagnosis present

## 2014-05-29 DIAGNOSIS — Q512 Other doubling of uterus, unspecified: Secondary | ICD-10-CM

## 2014-05-29 DIAGNOSIS — O2390 Unspecified genitourinary tract infection in pregnancy, unspecified trimester: Secondary | ICD-10-CM | POA: Diagnosis present

## 2014-05-29 DIAGNOSIS — O9989 Other specified diseases and conditions complicating pregnancy, childbirth and the puerperium: Secondary | ICD-10-CM

## 2014-05-29 DIAGNOSIS — Q5128 Other doubling of uterus, other specified: Secondary | ICD-10-CM

## 2014-05-29 DIAGNOSIS — R8271 Bacteriuria: Secondary | ICD-10-CM | POA: Diagnosis present

## 2014-05-29 DIAGNOSIS — Z8759 Personal history of other complications of pregnancy, childbirth and the puerperium: Secondary | ICD-10-CM

## 2014-05-29 DIAGNOSIS — O4700 False labor before 37 completed weeks of gestation, unspecified trimester: Principal | ICD-10-CM | POA: Diagnosis present

## 2014-05-29 DIAGNOSIS — N390 Urinary tract infection, site not specified: Secondary | ICD-10-CM | POA: Diagnosis present

## 2014-05-29 DIAGNOSIS — O3400 Maternal care for unspecified congenital malformation of uterus, unspecified trimester: Secondary | ICD-10-CM | POA: Diagnosis present

## 2014-05-29 DIAGNOSIS — O3460 Maternal care for abnormality of vagina, unspecified trimester: Secondary | ICD-10-CM | POA: Diagnosis present

## 2014-05-29 DIAGNOSIS — O321XX0 Maternal care for breech presentation, not applicable or unspecified: Secondary | ICD-10-CM | POA: Diagnosis present

## 2014-05-29 DIAGNOSIS — O99891 Other specified diseases and conditions complicating pregnancy: Secondary | ICD-10-CM | POA: Diagnosis present

## 2014-05-29 HISTORY — DX: Maternal care for benign tumor of corpus uteri, unspecified trimester: O34.10

## 2014-05-29 HISTORY — DX: Leiomyoma of uterus, unspecified: D25.9

## 2014-05-29 HISTORY — DX: Other doubling of uterus, unspecified: Q51.20

## 2014-05-29 HISTORY — DX: Other and unspecified doubling of uterus: Q51.28

## 2014-05-29 LAB — URINE MICROSCOPIC-ADD ON

## 2014-05-29 LAB — URINALYSIS, ROUTINE W REFLEX MICROSCOPIC
BILIRUBIN URINE: NEGATIVE
GLUCOSE, UA: NEGATIVE mg/dL
KETONES UR: 15 mg/dL — AB
Leukocytes, UA: NEGATIVE
Nitrite: NEGATIVE
PH: 6.5 (ref 5.0–8.0)
Protein, ur: NEGATIVE mg/dL
SPECIFIC GRAVITY, URINE: 1.01 (ref 1.005–1.030)
Urobilinogen, UA: 0.2 mg/dL (ref 0.0–1.0)

## 2014-05-29 LAB — WET PREP, GENITAL
CLUE CELLS WET PREP: NONE SEEN
Trich, Wet Prep: NONE SEEN
Yeast Wet Prep HPF POC: NONE SEEN

## 2014-05-29 MED ORDER — CEPHALEXIN 500 MG PO CAPS
500.0000 mg | ORAL_CAPSULE | Freq: Two times a day (BID) | ORAL | Status: DC
  Administered 2014-05-29 – 2014-06-01 (×5): 500 mg via ORAL
  Filled 2014-05-29 (×7): qty 1

## 2014-05-29 MED ORDER — NIFEDIPINE 10 MG PO CAPS
10.0000 mg | ORAL_CAPSULE | ORAL | Status: DC | PRN
  Administered 2014-05-29 – 2014-05-30 (×3): 10 mg via ORAL
  Filled 2014-05-29 (×3): qty 1

## 2014-05-29 NOTE — MAU Provider Note (Signed)
History   23 yo G3P0020 at 81 3/7 weeks presented after calling to report UCs x 24 hours.  Denies leaking or bleeding, reports +FM.  Patient Active Problem List   Diagnosis Date Noted  . Uterine septum affecting pregnancy 05/29/2014  . Breech presentation  05/29/2014  . Uterine fibroids affecting pregnancy 05/29/2014  . GBS bacteriuria 05/29/2014  . E. coli UTI dx 05/21/14 05/29/2014  . H/O 2 abortions 05/29/2014  . 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 Preterm labor 05/29/2014    Chief Complaint  Patient presents with  . Abdominal Pain  . Back Pain   HPI:  See above  OB History   Grav Para Term Preterm Abortions TAB SAB Ect Mult Living   4    3 1 2    0      History reviewed. No pertinent past medical history.  History reviewed. No pertinent past surgical history.  History reviewed. No pertinent family history.  History  Substance Use Topics  . Smoking status: Never Smoker   . Smokeless tobacco: Not on file  . Alcohol Use: No    Allergies:  Allergies  Allergen Reactions  . Coconut Oil Swelling and Rash  . Pineapple Rash    Prescriptions prior to admission  Medication Sig Dispense Refill  . beta carotene w/minerals (OCUVITE) tablet Take 1 tablet by mouth daily.      Marland Kitchen doxycycline (VIBRAMYCIN) 100 MG capsule Take 1 capsule (100 mg total) by mouth 2 (two) times daily.  20 capsule  0  . metroNIDAZOLE (FLAGYL) 500 MG tablet Take 1 tablet (500 mg total) by mouth 2 (two) times daily.  20 tablet  0  . naproxen (NAPROSYN) 500 MG tablet Take 1 tablet (500 mg total) by mouth 2 (two) times daily with a meal.  30 tablet  0  . naproxen sodium (ANAPROX) 220 MG tablet Take 220-440 mg by mouth 2 (two) times daily as needed (menstrual cramps).        ROS:  Mild cramping, discharge, +FM Physical Exam   Blood pressure 144/63, pulse 100, temperature 98.5 F (36.9 C), temperature source Oral, resp. rate 18, height 5\' 5"  (1.651 m), weight 243 lb (110.224 kg),  SpO2 100.00%.  Physical Exam Chest clear Heart RRR without murmur Abd gravid, NT Pelvic--thin white vaginal d/c in vault, cervix closed, long, PP OOP Ext WNL  FHR reassuring for EGA UCs irregular, mild on arrival--had 8 in first 30 min..  ED Course  Assessment: IUP at 27 2/7 weeks PT UCs Recent + Ecoli urine culture--no treatment Uterine fibroids and septum GBS positive  Plan: PO hydration GC, chlamydia, wet prep Deferred FFN due to IC today at 9am--will plan patient to come to MAU on Saturday for FFN only. Procardia 10 mg po now--repeat q 20 min if UCs persist Keflex 500 mg po now--plan BID x 7 days for + urine culture. Does not need 3 hour GTT scheduled on 9/30--will have office cancel. Next ROB visit scheduled 9/20.   Donnel Saxon CNM, MSN 05/29/2014 11:29 PM  Addendum: Received 3 doses Procardia 10 mg po in MAU without benefit. UCs q 2-5 min, more painful to patient. FHR Category 2, with occasional mild variables with some UCs, overall reassuring. Received Keflex 500 mg po at 2323.   Results for orders placed during the hospital encounter of 05/29/14 (from the past 24 hour(s))  URINALYSIS, ROUTINE W REFLEX MICROSCOPIC     Status: Abnormal   Collection Time    05/29/14  10:10 PM      Result Value Ref Range   Color, Urine YELLOW  YELLOW   APPearance CLEAR  CLEAR   Specific Gravity, Urine 1.010  1.005 - 1.030   pH 6.5  5.0 - 8.0   Glucose, UA NEGATIVE  NEGATIVE mg/dL   Hgb urine dipstick TRACE (*) NEGATIVE   Bilirubin Urine NEGATIVE  NEGATIVE   Ketones, ur 15 (*) NEGATIVE mg/dL   Protein, ur NEGATIVE  NEGATIVE mg/dL   Urobilinogen, UA 0.2  0.0 - 1.0 mg/dL   Nitrite NEGATIVE  NEGATIVE   Leukocytes, UA NEGATIVE  NEGATIVE  URINE MICROSCOPIC-ADD ON     Status: Abnormal   Collection Time    05/29/14 10:10 PM      Result Value Ref Range   Squamous Epithelial / LPF RARE  RARE   WBC, UA 0-2  <3 WBC/hpf   RBC / HPF 0-2  <3 RBC/hpf   Bacteria, UA RARE  RARE    Crystals CA OXALATE CRYSTALS (*) NEGATIVE  WET PREP, GENITAL     Status: Abnormal   Collection Time    05/29/14 11:03 PM      Result Value Ref Range   Yeast Wet Prep HPF POC NONE SEEN  NONE SEEN   Trich, Wet Prep NONE SEEN  NONE SEEN   Clue Cells Wet Prep HPF POC NONE SEEN  NONE SEEN   WBC, Wet Prep HPF POC FEW (*) NONE SEEN   Urine to culture  Consulted with Dr. Charlesetta Garibaldi. Will admit for magnesium sulfate, betamethasone course, FFN tomorrow evening after 2306. Plan of care reviewed with patient and partner.  They are in agreement. Will need to admit to [facility] due to no antenatal beds available.  Donnel Saxon, CNM 05/30/14 0140

## 2014-05-29 NOTE — MAU Note (Signed)
PT  SAYS SHE STARTED CRAMPING  YESTERDAY-- CONTINUED TODAY.  LAST SEX-  TODAY- 0900.  NO VE IN OFFICE.

## 2014-05-29 NOTE — MAU Note (Signed)
Pelvic & low back pain with abdominal tightening ever 20 minutes since yesterday. Denies vaginal bleeding or discharge. Positive fetal movement. Denies urinary complaints.

## 2014-05-30 ENCOUNTER — Encounter (HOSPITAL_COMMUNITY): Payer: Self-pay | Admitting: *Deleted

## 2014-05-30 DIAGNOSIS — O3410 Maternal care for benign tumor of corpus uteri, unspecified trimester: Secondary | ICD-10-CM | POA: Diagnosis present

## 2014-05-30 DIAGNOSIS — O3400 Maternal care for unspecified congenital malformation of uterus, unspecified trimester: Secondary | ICD-10-CM | POA: Diagnosis present

## 2014-05-30 DIAGNOSIS — O4700 False labor before 37 completed weeks of gestation, unspecified trimester: Secondary | ICD-10-CM | POA: Diagnosis present

## 2014-05-30 DIAGNOSIS — O2390 Unspecified genitourinary tract infection in pregnancy, unspecified trimester: Secondary | ICD-10-CM | POA: Diagnosis present

## 2014-05-30 DIAGNOSIS — O99891 Other specified diseases and conditions complicating pregnancy: Secondary | ICD-10-CM | POA: Diagnosis present

## 2014-05-30 DIAGNOSIS — O321XX0 Maternal care for breech presentation, not applicable or unspecified: Secondary | ICD-10-CM | POA: Diagnosis present

## 2014-05-30 DIAGNOSIS — A498 Other bacterial infections of unspecified site: Secondary | ICD-10-CM | POA: Diagnosis present

## 2014-05-30 DIAGNOSIS — Q5128 Other doubling of uterus, other specified: Secondary | ICD-10-CM | POA: Diagnosis not present

## 2014-05-30 DIAGNOSIS — Z22330 Carrier of Group B streptococcus: Secondary | ICD-10-CM | POA: Diagnosis not present

## 2014-05-30 DIAGNOSIS — N390 Urinary tract infection, site not specified: Secondary | ICD-10-CM | POA: Diagnosis present

## 2014-05-30 DIAGNOSIS — D259 Leiomyoma of uterus, unspecified: Secondary | ICD-10-CM | POA: Diagnosis present

## 2014-05-30 LAB — CBC
HCT: 34.5 % — ABNORMAL LOW (ref 36.0–46.0)
Hemoglobin: 11.4 g/dL — ABNORMAL LOW (ref 12.0–15.0)
MCH: 27.5 pg (ref 26.0–34.0)
MCHC: 33 g/dL (ref 30.0–36.0)
MCV: 83.1 fL (ref 78.0–100.0)
PLATELETS: 278 10*3/uL (ref 150–400)
RBC: 4.15 MIL/uL (ref 3.87–5.11)
RDW: 15.6 % — ABNORMAL HIGH (ref 11.5–15.5)
WBC: 16.8 10*3/uL — AB (ref 4.0–10.5)

## 2014-05-30 LAB — CREATININE, SERUM
CREATININE: 0.45 mg/dL — AB (ref 0.50–1.10)
Creatinine, Ser: 0.46 mg/dL — ABNORMAL LOW (ref 0.50–1.10)
GFR calc Af Amer: >90 mL/min (ref >90)
GFR calc Af Amer: >90 mL/min (ref >90)
GFR calc non Af Amer: >90 mL/min (ref >90)

## 2014-05-30 LAB — TYPE AND SCREEN
ABO/RH(D): O POS
ANTIBODY SCREEN: NEGATIVE

## 2014-05-30 LAB — MAGNESIUM: MAGNESIUM: 4.2 mg/dL — AB (ref 1.5–2.5)

## 2014-05-30 LAB — GC/CHLAMYDIA PROBE AMP
CT Probe RNA: NEGATIVE
GC PROBE AMP APTIMA: NEGATIVE

## 2014-05-30 LAB — ABO/RH: ABO/RH(D): O POS

## 2014-05-30 MED ORDER — CALCIUM CARBONATE ANTACID 500 MG PO CHEW
2.0000 | CHEWABLE_TABLET | ORAL | Status: DC | PRN
  Administered 2014-05-30: 400 mg via ORAL
  Filled 2014-05-30 (×2): qty 1

## 2014-05-30 MED ORDER — ACETAMINOPHEN 325 MG PO TABS
650.0000 mg | ORAL_TABLET | ORAL | Status: DC | PRN
  Administered 2014-05-30: 650 mg via ORAL
  Filled 2014-05-30: qty 2

## 2014-05-30 MED ORDER — ZOLPIDEM TARTRATE 5 MG PO TABS: 5.0000 mg | ORAL_TABLET | Freq: Every evening | ORAL | Status: DC | PRN

## 2014-05-30 MED ORDER — LACTATED RINGERS IV SOLN
INTRAVENOUS | Status: DC
  Administered 2014-05-30 (×2): via INTRAVENOUS
  Administered 2014-05-31: 100 mL/h via INTRAVENOUS
  Administered 2014-05-31 – 2014-06-01 (×3): via INTRAVENOUS

## 2014-05-30 MED ORDER — PRENATAL MULTIVITAMIN CH
1.0000 | ORAL_TABLET | Freq: Every day | ORAL | Status: DC
  Administered 2014-05-30 – 2014-05-31 (×2): 1 via ORAL
  Filled 2014-05-30 (×2): qty 1

## 2014-05-30 MED ORDER — MAGNESIUM SULFATE 40 G IN LACTATED RINGERS - SIMPLE
2.0000 g/h | INTRAVENOUS | Status: AC
  Administered 2014-05-30: 2 g/h via INTRAVENOUS
  Filled 2014-05-30 (×2): qty 500

## 2014-05-30 MED ORDER — DOCUSATE SODIUM 100 MG PO CAPS
100.0000 mg | ORAL_CAPSULE | Freq: Every day | ORAL | Status: DC
  Administered 2014-05-30 – 2014-05-31 (×2): 100 mg via ORAL
  Filled 2014-05-30 (×2): qty 1

## 2014-05-30 MED ORDER — MAGNESIUM SULFATE BOLUS VIA INFUSION
4.0000 g | Freq: Once | INTRAVENOUS | Status: AC
  Administered 2014-05-30: 4 g via INTRAVENOUS
  Filled 2014-05-30: qty 500

## 2014-05-30 MED ORDER — BETAMETHASONE SOD PHOS & ACET 6 (3-3) MG/ML IJ SUSP
12.0000 mg | INTRAMUSCULAR | Status: AC
  Administered 2014-05-30 – 2014-05-31 (×2): 12 mg via INTRAMUSCULAR
  Filled 2014-05-30 (×2): qty 2

## 2014-05-30 MED ORDER — LACTATED RINGERS IV BOLUS (SEPSIS)
300.0000 mL | Freq: Once | INTRAVENOUS | Status: AC
  Administered 2014-05-30: 300 mL via INTRAVENOUS

## 2014-05-30 NOTE — Progress Notes (Signed)
Has slept at intervals--back pain earlier, resolved with Tylenol. Denies awareness of any contractions. Filed Vitals:   05/30/14 0337 05/30/14 0402 05/30/14 0424 05/30/14 0500  BP:  133/50  120/55  Pulse:  118  117  Temp:      TempSrc:      Resp: 20  20   Height:      Weight:      SpO2:       FHR Category 1 UCs q 8 min, mild  Will continue to observe. Continue magnesium at present Plan FFN after 2306. 2nd dose betamethasone at [place], CNM 05/30/14 0600

## 2014-05-30 NOTE — Progress Notes (Signed)
Hospital day # 1 pregnancy at [redacted]w[redacted]d--PTL  S:  Perception of contractions: none      Vaginal bleeding: none now       Vaginal discharge:  no significant change      Wants to go home  O: BP 135/64  Pulse 111  Temp(Src) 98.5 F (36.9 C) (Oral)  Resp 18  Ht 5\' 5"  (1.651 m)  Wt 110.224 kg (243 lb)  BMI 40.44 kg/m2  SpO2 100%      Fetal tracings: 140 + accel, no decels, moderate variability      Contractions:   none      Uterus gravid and non-tender      Extremities: extremities normal, atraumatic, no cyanosis or edema and no significant edema and no signs of DVT      Gen:  alert       CVS: s1, S2, RRR       Pulm: CTAB       UOP 700cc since 0700      Abd:S/NT/Gravid         Labs:   Results for orders placed during the hospital encounter of 05/29/14 (from the past 24 hour(s))  URINALYSIS, ROUTINE W REFLEX MICROSCOPIC     Status: Abnormal   Collection Time    05/29/14 10:10 PM      Result Value Ref Range   Color, Urine YELLOW  YELLOW   APPearance CLEAR  CLEAR   Specific Gravity, Urine 1.010  1.005 - 1.030   pH 6.5  5.0 - 8.0   Glucose, UA NEGATIVE  NEGATIVE mg/dL   Hgb urine dipstick TRACE (*) NEGATIVE   Bilirubin Urine NEGATIVE  NEGATIVE   Ketones, ur 15 (*) NEGATIVE mg/dL   Protein, ur NEGATIVE  NEGATIVE mg/dL   Urobilinogen, UA 0.2  0.0 - 1.0 mg/dL   Nitrite NEGATIVE  NEGATIVE   Leukocytes, UA NEGATIVE  NEGATIVE  URINE MICROSCOPIC-ADD ON     Status: Abnormal   Collection Time    05/29/14 10:10 PM      Result Value Ref Range   Squamous Epithelial / LPF RARE  RARE   WBC, UA 0-2  <3 WBC/hpf   RBC / HPF 0-2  <3 RBC/hpf   Bacteria, UA RARE  RARE   Crystals CA OXALATE CRYSTALS (*) NEGATIVE  WET PREP, GENITAL     Status: Abnormal   Collection Time    05/29/14 11:03 PM      Result Value Ref Range   Yeast Wet Prep HPF POC NONE SEEN  NONE SEEN   Trich, Wet Prep NONE SEEN  NONE SEEN   Clue Cells Wet Prep HPF POC NONE SEEN  NONE SEEN   WBC, Wet Prep HPF POC FEW (*) NONE  SEEN  GC/CHLAMYDIA PROBE AMP     Status: None   Collection Time    05/29/14 11:03 PM      Result Value Ref Range   CT Probe RNA NEGATIVE  NEGATIVE   GC Probe RNA NEGATIVE  NEGATIVE  CBC     Status: Abnormal   Collection Time    05/30/14  1:45 AM      Result Value Ref Range   WBC 16.8 (*) 4.0 - 10.5 K/uL   RBC 4.15  3.87 - 5.11 MIL/uL   Hemoglobin 11.4 (*) 12.0 - 15.0 g/dL   HCT 34.5 (*) 36.0 - 46.0 %   MCV 83.1  78.0 - 100.0 fL   MCH 27.5  26.0 - 34.0 pg  MCHC 33.0  30.0 - 36.0 g/dL   RDW 15.6 (*) 11.5 - 15.5 %   Platelets 278  150 - 400 K/uL  TYPE AND SCREEN     Status: None   Collection Time    05/30/14  1:45 AM      Result Value Ref Range   ABO/RH(D) O POS     Antibody Screen NEG     Sample Expiration 06/02/2014    ABO/RH     Status: None   Collection Time    05/30/14  1:45 AM      Result Value Ref Range   ABO/RH(D) O POS    CREATININE, SERUM     Status: Abnormal   Collection Time    05/30/14  1:45 AM      Result Value Ref Range   Creatinine, Ser 0.45 (*) 0.50 - 1.10 mg/dL   GFR calc non Af Amer >90  >90 mL/min   GFR calc Af Amer >90  >90 mL/min         Meds: Current facility-administered medications:acetaminophen (TYLENOL) tablet 650 mg, 650 mg, Oral, Q4H PRN, Donnel Saxon, CNM, 650 mg at 05/30/14 0424;  betamethasone acetate-betamethasone sodium phosphate (CELESTONE) injection 12 mg, 12 mg, Intramuscular, Q24H, Donnel Saxon, CNM, 12 mg at 05/30/14 0158;  calcium carbonate (TUMS - dosed in mg elemental calcium) chewable tablet 400 mg of elemental calcium, 2 tablet, Oral, Q4H PRN, Donnel Saxon, CNM cephALEXin (KEFLEX) capsule 500 mg, 500 mg, Oral, Q12H, Donnel Saxon, CNM, 500 mg at 05/30/14 1310;  docusate sodium (COLACE) capsule 100 mg, 100 mg, Oral, Daily, Donnel Saxon, CNM, 100 mg at 05/30/14 1312;  lactated ringers infusion, , Intravenous, Continuous, Donnel Saxon, CNM, Last Rate: 100 mL/hr at 05/30/14 0305 magnesium sulfate 40 grams in LR 500 mL OB infusion, 2  g/hr, Intravenous, Titrated, Donnel Saxon, CNM, Last Rate: 25 mL/hr at 05/30/14 0301, 2 g/hr at 05/30/14 0301;  prenatal multivitamin tablet 1 tablet, 1 tablet, Oral, Q1200, Donnel Saxon, CNM, 1 tablet at 05/30/14 1312;  zolpidem (AMBIEN) tablet 5 mg, 5 mg, Oral, QHS PRN, Donnel Saxon, CNM  A: [redacted]w[redacted]d with PTL     stable     Fetal tracings:      Contractions: none     Uterus non-tender      Extremities: DTR 1+, no clonus, no edema     Magnesium sulfate  2 gm/hr x 13 hours  P: Continue current plan of care      Upcoming tests/treatments:    Labs: Creatinine and a Mag level now  BMZ and FFN tonight      Consult with Dr. Alesia Richards for plan on care      MDs will follow    Joani Cosma, CNM, MSN 05/30/2014. 2:41 PM

## 2014-05-30 NOTE — H&P (Signed)
Natasha Bernard is a 23 y.o. female, G3P0020 at 91 3/7 weeks, presenting for admission due to persistent UCs despite Procardia x 3 doses in MAU.  UCs started > 24 hours ago, denies leaking or bleeding, reports +FM.  Last IC at [place] on 05/29/14.  Cervix was closed on exam in MAU, but patient known to have fibroids and a uterine septum.  She also had recent dx of Ecoli UTI, with no treatment yet.  Received Keflex 500 mg po for initial treatment of UTI at 2323. Received Procardia 10 mg po at 2321, 0010, and 0031, with no benefit to contractions.  UCs became more uncomfortable during that time.  Patient Active Problem List   Diagnosis Date Noted  . Uterine septum affecting pregnancy 05/29/2014  . Breech presentation  05/29/2014  . Uterine fibroids affecting pregnancy 05/29/2014  . GBS bacteriuria 05/29/2014  . E. coli UTI dx 05/21/14 05/29/2014  . H/O 2 abortions 05/29/2014  . 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 Preterm labor 05/29/2014    History of present pregnancy: Patient entered care at 10 2/7  weeks.   EDC of 08/26/14 was established by LMP and confirmed by [place] at 13 2/7 weeks.   Anatomy scan:  18 1/7 weeks, with limited anatomy and an anterior placenta. 3 fibroids noted--posterior right intramural (2.8 x 2.8 x 3.1), posterior left intramural 1.5 x 1.0 x 1.5), anterior right subserosal (3.1 x 2.2 x 2.7).  EFW 69%ile, normal fluid, cervix 4.09   Additional [place] evaluations: 23 weeks, for f/u anatomy:  WNL, profile and nasal bone still not seen. 26 1/7 weeks:  BPP due to NRNST, 8/8, normal fluid, breech, uterine septum noted, with vtx in right side, AFI 17.2, 60%ile, cervix long/closed. Significant prenatal events:  GBS positive on NOB urine culture, treated.  Repeat TOC showed 50K of same.   Fibroids and uterine septum dx at 26 weeks.  Dx with leukorrhea at 22 weeks.   Last evaluation:  05/21/14--discussion of baby's position and need for C/S if breech persists, since ECV  was not recommended by Dr. Mancel Bale due to uterine septum.    OB History   Grav Para Term Preterm Abortions TAB SAB Ect Mult Living   3    2 2  0   0    2010--TAB at 12 weeks 2011--TAB at 12 weeks  Medical Hx:  Usual childhood illnesses  History reviewed. No pertinent past surgical history.  Family History: PGF HTN; MGF colon ca: PGM HTN  Social History:  reports that she has never smoked. She does not have any smokeless tobacco history on file. She reports that she does not drink alcohol or use illicit drugs. 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  FOB is present and supportive.   Prenatal Transfer Tool  Maternal Diabetes: No Genetic Screening: Normal 1st trimester and AFP Maternal Ultrasounds/Referrals: Abnormal:  Findings:   Other:Fibroids, uterine septum Fetal Ultrasounds or other Referrals:  None Maternal Substance Abuse:  No Significant Maternal Medications:  None Significant Maternal Lab Results: Lab values include: Group B Strep positive Received TDAP and flu vaccine 05/21/14.   ROS:  Contractions, +FM  Allergies  Allergen Reactions  . Coconut Oil Swelling and Rash  . Pineapple Rash     Dilation: Closed Exam by:: Juel Ripley, CNM Blood pressure 147/71, pulse 120, temperature 98.5 F (36.9 C), temperature source Oral, resp. rate 18, height 5\' 5"  (1.651 m), weight 243 lb (  110.224 kg), SpO2 100.00%.  Chest clear Heart RRR without murmur Abd gravid, NT, FH 28 Pelvic: cervix closed, long, PP OOP. Ext: WNL  FHR: Category 2, mild variables, moderate variability UCs:  q 2-5 min, mild/moderate  Prenatal labs: ABO, Rh:  O+ Antibody:  Neg Rubella:   Immune RPR:   NR HBsAg:   neg HIV:   NR GBS:  Positive on NOB urine culture 01/15/14 1000 col, 50K on repeat 03/26/14 Sickle cell/Hgb electrophoresis:  AA Pap:  ASCUS with HR HPV 01/30/14 GC:  Negative 01/15/14 Chlamydia:  Negative  01/15/14 Genetic screenings:  1st trimester screen and AFP WNL Glucola:  Normal early glucola, had f/u 3 hour GTT that was WNL, another 1 hour glucola 05/21/14 WNL Other:  Hgb 11.4 at NOB, 10.0 at 27 weeks. Urine culture 9/16--100k Ecoli.    Results for orders placed during the hospital encounter of 05/29/14 (from the past 24 hour(s))  URINALYSIS, ROUTINE W REFLEX MICROSCOPIC     Status: Abnormal   Collection Time    05/29/14 10:10 PM      Result Value Ref Range   Color, Urine YELLOW  YELLOW   APPearance CLEAR  CLEAR   Specific Gravity, Urine 1.010  1.005 - 1.030   pH 6.5  5.0 - 8.0   Glucose, UA NEGATIVE  NEGATIVE mg/dL   Hgb urine dipstick TRACE (*) NEGATIVE   Bilirubin Urine NEGATIVE  NEGATIVE   Ketones, ur 15 (*) NEGATIVE mg/dL   Protein, ur NEGATIVE  NEGATIVE mg/dL   Urobilinogen, UA 0.2  0.0 - 1.0 mg/dL   Nitrite NEGATIVE  NEGATIVE   Leukocytes, UA NEGATIVE  NEGATIVE  URINE MICROSCOPIC-ADD ON     Status: Abnormal   Collection Time    05/29/14 10:10 PM      Result Value Ref Range   Squamous Epithelial / LPF RARE  RARE   WBC, UA 0-2  <3 WBC/hpf   RBC / HPF 0-2  <3 RBC/hpf   Bacteria, UA RARE  RARE   Crystals CA OXALATE CRYSTALS (*) NEGATIVE  WET PREP, GENITAL     Status: Abnormal   Collection Time    05/29/14 11:03 PM      Result Value Ref Range   Yeast Wet Prep HPF POC NONE SEEN  NONE SEEN   Trich, Wet Prep NONE SEEN  NONE SEEN   Clue Cells Wet Prep HPF POC NONE SEEN  NONE SEEN   WBC, Wet Prep HPF POC FEW (*) NONE SEEN  CBC     Status: Abnormal   Collection Time    05/30/14  1:45 AM      Result Value Ref Range   WBC 16.8 (*) 4.0 - 10.5 K/uL   RBC 4.15  3.87 - 5.11 MIL/uL   Hemoglobin 11.4 (*) 12.0 - 15.0 g/dL   HCT 34.5 (*) 36.0 - 46.0 %   MCV 83.1  78.0 - 100.0 fL   MCH 27.5  26.0 - 34.0 pg   MCHC 33.0  30.0 - 36.0 g/dL   RDW 15.6 (*) 11.5 - 15.5 %   Platelets 278  150 - 400 K/uL   Urine culture and GC/chlamydia pending.    Assessment/Plan: IUP at 27  3/7 weeks PTL Fibroid uterus Uterine septum  Plan: Admit to [facility] (no beds on Antenatal) per consult with Dr. Charlesetta Garibaldi. Routine antenatal orders. Magnesium sulfate 4 gm bolus, 2 gm/hr. Betamethasone course FFN tonight after 2306 BR with BRP Continuous EFM.  Allena Katz, [place] 05/30/2014,  1:42 AM

## 2014-05-30 NOTE — Progress Notes (Addendum)
Patient ID: Natasha Bernard, female   DOB: Dec 19, 1990, 23 y.o.   MRN: [id number] Patient seen at bedside at 9.30am.  S: Patient denies nausea/vomiting/chest pain/shortness of breath.  She denies feeling abdominal cramping or contractions or vaginal bleeding or leakage of fluid. + GFM.  O:  Filed Vitals:   05/30/14 1223  BP: 135/64  Pulse: 111  Temp: 98.5 F (36.9 C)  Resp: 18   Intake over 12 hrs: 1413 cc.  Output over 12 hrs 1150.    Gen: NAD. Abd: Soft, NT/Gravid. Ext: Warm and well perfused, no edema, no calf tenderness.  EFM: 140 baseline, moderate variability, reactive (10 X 10 criteria).  TOCO: no contractions.       Recent Results (from the past 2160 hour(s))  URINALYSIS, ROUTINE W REFLEX MICROSCOPIC     Status: Abnormal   Collection Time    05/29/14 10:10 PM      Result Value Ref Range   Color, Urine YELLOW  YELLOW   APPearance CLEAR  CLEAR   Specific Gravity, Urine 1.010  1.005 - 1.030   pH 6.5  5.0 - 8.0   Glucose, UA NEGATIVE  NEGATIVE mg/dL   Hgb urine dipstick TRACE (*) NEGATIVE   Bilirubin Urine NEGATIVE  NEGATIVE   Ketones, ur 15 (*) NEGATIVE mg/dL   Protein, ur NEGATIVE  NEGATIVE mg/dL   Urobilinogen, UA 0.2  0.0 - 1.0 mg/dL   Nitrite NEGATIVE  NEGATIVE   Leukocytes, UA NEGATIVE  NEGATIVE  URINE MICROSCOPIC-ADD ON     Status: Abnormal   Collection Time    05/29/14 10:10 PM      Result Value Ref Range   Squamous Epithelial / LPF RARE  RARE   WBC, UA 0-2  <3 WBC/hpf   RBC / HPF 0-2  <3 RBC/hpf   Bacteria, UA RARE  RARE   Crystals CA OXALATE CRYSTALS (*) NEGATIVE  WET PREP, GENITAL     Status: Abnormal   Collection Time    05/29/14 11:03 PM      Result Value Ref Range   Yeast Wet Prep HPF POC NONE SEEN  NONE SEEN   Trich, Wet Prep NONE SEEN  NONE SEEN   Clue Cells Wet Prep HPF POC NONE SEEN  NONE SEEN   WBC, Wet Prep HPF POC FEW (*) NONE SEEN   Comment: MODERATE BACTERIA SEEN  GC/CHLAMYDIA PROBE AMP     Status: None   Collection Time    05/29/14  11:03 PM      Result Value Ref Range   CT Probe RNA NEGATIVE  NEGATIVE   GC Probe RNA NEGATIVE  NEGATIVE   Comment: (NOTE)                                                                                               **Normal Reference Range: Negative**          Assay performed using the Gen-Probe APTIMA COMBO2 (R) Assay.     Acceptable specimen types for this assay include APTIMA Swabs (Unisex,     endocervical, urethral, or vaginal), first void urine, and ThinPrep  liquid based cytology samples.     Performed at [facility]  CBC     Status: Abnormal   Collection Time    05/30/14  1:45 AM      Result Value Ref Range   WBC 16.8 (*) 4.0 - 10.5 K/uL   RBC 4.15  3.87 - 5.11 MIL/uL   Hemoglobin 11.4 (*) 12.0 - 15.0 g/dL   HCT 34.5 (*) 36.0 - 46.0 %   MCV 83.1  78.0 - 100.0 fL   MCH 27.5  26.0 - 34.0 pg   MCHC 33.0  30.0 - 36.0 g/dL   RDW 15.6 (*) 11.5 - 15.5 %   Platelets 278  150 - 400 K/uL  TYPE AND SCREEN     Status: None   Collection Time    05/30/14  1:45 AM      Result Value Ref Range   ABO/RH(D) O POS     Antibody Screen NEG     Sample Expiration 06/02/2014    ABO/RH     Status: None   Collection Time    05/30/14  1:45 AM      Result Value Ref Range   ABO/RH(D) O POS    CREATININE, SERUM     Status: Abnormal   Collection Time    05/30/14  1:45 AM      Result Value Ref Range   Creatinine, Ser 0.45 (*) 0.50 - 1.10 mg/dL   GFR calc non Af Amer >90  >90 mL/min   GFR calc Af Amer >90  >90 mL/min   Comment: (NOTE)     The eGFR has been calculated using the CKD EPI equation.     This calculation has not been validated in all clinical situations.     eGFR's persistently <90 mL/min signify possible Chronic Kidney     Disease.   A/p: 23 y/o G3P0 @ 92 W 4 D EGA, with history of uterine septum, fibroids, who presented with abdominal cramping, s/p Procardia, now on Magnesium sulfate -Continue with Magnesium sulfate until Beta complete (9/27)midnight. -Continue  with monitoring for signs and symptoms of Magnesium toxicity. -for second betamethasone injection midnight 9/26. -Keflex for UTI. -FFN test this evening. -Continue with EFM and TOCO.

## 2014-05-30 NOTE — Progress Notes (Signed)
Now on [place], due to no bed available in Antenatal. Received magnesium bolus at 0240, rate now at 2 gm/hr since 0305. Received 1st dose betamethasone 0158  FHR Category 1 UCs irregular, milder.  Filed Vitals:   05/30/14 0049 05/30/14 0240 05/30/14 0251 05/30/14 0305  BP: 147/71 130/74 133/76 134/77  Pulse: 120 105 105 104  Temp:  98.2 F (36.8 C)    TempSrc:  Oral    Resp:  20 20 20   Height:      Weight:      SpO2:       Will continue to observe.  Donnel Saxon, CNM 9//25/15 [phone]

## 2014-05-30 NOTE — Progress Notes (Signed)
Venus Standard CNM notified that serum creatinine was added, by lab, to blood drawn around 0130 this a.m.  Order received for current level.

## 2014-05-30 NOTE — Progress Notes (Signed)
Hospital day # 1 pregnancy at [redacted]w[redacted]d--PTL.  S:  Perception of contractions: none      Vaginal bleeding: none now       Vaginal discharge:  no significant change      +FM  O: BP 124/66  Pulse 114  Temp(Src) 98.2 F (36.8 C) (Oral)  Resp 18  Ht 5\' 5"  (1.651 m)  Wt 110.224 kg (243 lb)  BMI 40.44 kg/m2  SpO2 100%      Fetal tracings: reassuring      Contractions:   none      Uterus gravid and non-tender      Extremities: extremities normal, atraumatic, no cyanosis or edema and no significant edema and no signs of DVT          Labs:   Results for orders placed during the hospital encounter of 05/29/14 (from the past 24 hour(s))  URINALYSIS, ROUTINE W REFLEX MICROSCOPIC     Status: Abnormal   Collection Time    05/29/14 10:10 PM      Result Value Ref Range   Color, Urine YELLOW  YELLOW   APPearance CLEAR  CLEAR   Specific Gravity, Urine 1.010  1.005 - 1.030   pH 6.5  5.0 - 8.0   Glucose, UA NEGATIVE  NEGATIVE mg/dL   Hgb urine dipstick TRACE (*) NEGATIVE   Bilirubin Urine NEGATIVE  NEGATIVE   Ketones, ur 15 (*) NEGATIVE mg/dL   Protein, ur NEGATIVE  NEGATIVE mg/dL   Urobilinogen, UA 0.2  0.0 - 1.0 mg/dL   Nitrite NEGATIVE  NEGATIVE   Leukocytes, UA NEGATIVE  NEGATIVE  URINE MICROSCOPIC-ADD ON     Status: Abnormal   Collection Time    05/29/14 10:10 PM      Result Value Ref Range   Squamous Epithelial / LPF RARE  RARE   WBC, UA 0-2  <3 WBC/hpf   RBC / HPF 0-2  <3 RBC/hpf   Bacteria, UA RARE  RARE   Crystals CA OXALATE CRYSTALS (*) NEGATIVE  WET PREP, GENITAL     Status: Abnormal   Collection Time    05/29/14 11:03 PM      Result Value Ref Range   Yeast Wet Prep HPF POC NONE SEEN  NONE SEEN   Trich, Wet Prep NONE SEEN  NONE SEEN   Clue Cells Wet Prep HPF POC NONE SEEN  NONE SEEN   WBC, Wet Prep HPF POC FEW (*) NONE SEEN  CBC     Status: Abnormal   Collection Time    05/30/14  1:45 AM      Result Value Ref Range   WBC 16.8 (*) 4.0 - 10.5 K/uL   RBC 4.15  3.87 - 5.11  MIL/uL   Hemoglobin 11.4 (*) 12.0 - 15.0 g/dL   HCT 34.5 (*) 36.0 - 46.0 %   MCV 83.1  78.0 - 100.0 fL   MCH 27.5  26.0 - 34.0 pg   MCHC 33.0  30.0 - 36.0 g/dL   RDW 15.6 (*) 11.5 - 15.5 %   Platelets 278  150 - 400 K/uL  TYPE AND SCREEN     Status: None   Collection Time    05/30/14  1:45 AM      Result Value Ref Range   ABO/RH(D) O POS     Antibody Screen NEG     Sample Expiration 06/02/2014    ABO/RH     Status: None   Collection Time    05/30/14  1:45  AM      Result Value Ref Range   ABO/RH(D) O POS           Meds: Current facility-administered medications:acetaminophen (TYLENOL) tablet 650 mg, 650 mg, Oral, Q4H PRN, Donnel Saxon, CNM, 650 mg at 05/30/14 0424;  betamethasone acetate-betamethasone sodium phosphate (CELESTONE) injection 12 mg, 12 mg, Intramuscular, Q24H, Donnel Saxon, CNM, 12 mg at 05/30/14 0158;  calcium carbonate (TUMS - dosed in mg elemental calcium) chewable tablet 400 mg of elemental calcium, 2 tablet, Oral, Q4H PRN, Donnel Saxon, CNM cephALEXin (KEFLEX) capsule 500 mg, 500 mg, Oral, Q12H, Donnel Saxon, CNM, 500 mg at 05/29/14 2343;  docusate sodium (COLACE) capsule 100 mg, 100 mg, Oral, Daily, Donnel Saxon, CNM;  lactated ringers infusion, , Intravenous, Continuous, Donnel Saxon, CNM, Last Rate: 100 mL/hr at 05/30/14 0305 magnesium sulfate 40 grams in LR 500 mL OB infusion, 2 g/hr, Intravenous, Titrated, Donnel Saxon, CNM, Last Rate: 25 mL/hr at 05/30/14 0301, 2 g/hr at 05/30/14 0301;  prenatal multivitamin tablet 1 tablet, 1 tablet, Oral, Q1200, Donnel Saxon, CNM;  zolpidem (AMBIEN) tablet 5 mg, 5 mg, Oral, QHS PRN, Donnel Saxon, CNM  A: [redacted]w[redacted]d with PTL     stable     Fetal tracings: reassuring     Contractions: none     Uterus non-tender      Extremities: DTR 2+, no clonus, no edema     Magnesium sulfate  2 gm/hr--started at 0301     Keflex 500mg  q12     BMZ #1 at 0158 05/30/14     GBS+   P: Continue current plan of care      Upcoming tests/treatments:    FFN tonight at 2300  BMZ #2 at 0200 05/31/14      Consult with Dr. Alesia Richards  for plan on care      MDs will follow       Jerric Oyen, CNM, MSN 05/30/2014. 9:37 AM

## 2014-05-30 NOTE — Progress Notes (Signed)
UR completed 

## 2014-05-31 NOTE — Progress Notes (Addendum)
Hospital day # 2 pregnancy at [redacted]w[redacted]d--PTL.  S:  Perception of contractions: none      Vaginal bleeding: none now       Vaginal discharge:  no significant change      +FM  O: BP 115/47  Pulse 110  Temp(Src) 98 F (36.7 C) (Oral)  Resp 18  Ht 5\' 5"  (1.651 m)  Wt 110.224 kg (243 lb)  BMI 40.44 kg/m2  SpO2 100%      Fetal tracings: reassuring for GA      Contractions:   none      Uterus gravid and non-tender      Extremities: extremities normal, atraumatic, no cyanosis or edema and no significant edema and no signs of DVT          Labs:   Results for orders placed during the hospital encounter of 05/29/14 (from the past 24 hour(s))  MAGNESIUM     Status: Abnormal   Collection Time    05/30/14  4:19 PM      Result Value Ref Range   Magnesium 4.2 (*) 1.5 - 2.5 mg/dL  CREATININE, SERUM     Status: Abnormal   Collection Time    05/30/14  4:19 PM      Result Value Ref Range   Creatinine, Ser 0.46 (*) 0.50 - 1.10 mg/dL   GFR calc non Af Amer >90  >90 mL/min   GFR calc Af Amer >90  >90 mL/min         Meds: Current facility-administered medications:acetaminophen (TYLENOL) tablet 650 mg, 650 mg, Oral, Q4H PRN, Donnel Saxon, CNM, 650 mg at 05/30/14 0424;  calcium carbonate (TUMS - dosed in mg elemental calcium) chewable tablet 400 mg of elemental calcium, 2 tablet, Oral, Q4H PRN, Donnel Saxon, CNM, 400 mg of elemental calcium at 05/30/14 2201;  cephALEXin (KEFLEX) capsule 500 mg, 500 mg, Oral, Q12H, Donnel Saxon, CNM, 500 mg at 05/31/14 1213 docusate sodium (COLACE) capsule 100 mg, 100 mg, Oral, Daily, Donnel Saxon, CNM, 100 mg at 05/31/14 1110;  lactated ringers infusion, , Intravenous, Continuous, Donnel Saxon, CNM, Last Rate: 100 mL/hr at 05/31/14 0834;  magnesium sulfate 40 grams in LR 500 mL OB infusion, 2 g/hr, Intravenous, Titrated, Donnel Saxon, CNM, Last Rate: 25 mL/hr at 05/30/14 2352, 2 g/hr at 05/30/14 2352 prenatal multivitamin tablet 1 tablet, 1 tablet, Oral, Q1200, Donnel Saxon, CNM, 1 tablet at 05/31/14 1110;  zolpidem (AMBIEN) tablet 5 mg, 5 mg, Oral, QHS PRN, Donnel Saxon, CNM  A: [redacted]w[redacted]d with PTL     Stable     Fetal tracings: reassuring for GA     Contractions: none     Uterus non-tender      Extremities: WNL     Magnesium sulfate  2 gm/hr  P: Continue current plan of care      Con't mag for 24 hour after 2nd dose of  BMZ       Consult with Dr. Landry Mellow for plan on care      Possible Dc to home tomorrow      MDs will follow   Venus Standard, CNM, MSN 05/31/2014. 1:26 PM  Pt seen and examined. I agree with continued magnesium until 24 hours from last dose of BMZ. Plan to d/c magnesium at 0200 on 06/01/2013. IF pt remains stable without regular contractions plan for discharge home in AM

## 2014-05-31 NOTE — Discharge Summary (Signed)
  Physician Discharge Summary  Patient ID: Natasha Bernard MRN: [id number] DOB/AGE: [birth year] 23 y.o.  Admit date: 05/29/2014 Discharge date: 05/31/2014  Admission Diagnoses: PTL  Discharge Diagnoses:  Active Problems:   Uterine septum affecting pregnancy   Breech presentation    Uterine fibroids affecting pregnancy   GBS bacteriuria   E. 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 rate 18, height 5\' 5"  (1.651 m), weight 110.224 kg (243 lb), SpO2 100.00%. General appearance: alert and cooperative  Disposition: 01-Home or Self Care     Medication List    ASK your doctor about these medications       IRON PO  Take 1 tablet by mouth 2 (two) times daily.     prenatal multivitamin Tabs tablet  Take 1 tablet by mouth daily at 12 noon.      Continue Keflex 500mg  bid for a total of 7 days     Follow-up Information   Follow up with [facility]. Schedule an appointment as soon as possible for a visit in 1 week. (Call with any questions or concerns, If symptoms worsen, As needed)    Specialty:  Obstetrics and Gynecology   Contact information:   [address] [place] [address] [phone]      Signed: Sallee Provencal, CNM, MSN 05/31/2014, 5:12 PM

## 2014-05-31 NOTE — Discharge Instructions (Signed)

## 2014-06-01 LAB — URINE CULTURE

## 2014-06-01 MED ORDER — ACETAMINOPHEN 325 MG PO TABS: 650.0000 mg | ORAL_TABLET | ORAL | Status: DC | PRN

## 2014-06-01 MED ORDER — CEPHALEXIN 500 MG PO CAPS: 500.0000 mg | ORAL_CAPSULE | Freq: Two times a day (BID) | ORAL | Status: AC

## 2014-06-01 MED ORDER — DSS 100 MG PO CAPS
100.0000 mg | ORAL_CAPSULE | Freq: Every day | ORAL | Status: DC
Start: 2014-06-01 — End: 2014-08-30

## 2014-06-01 MED ORDER — CALCIUM CARBONATE ANTACID 500 MG PO CHEW: 2.0000 | CHEWABLE_TABLET | ORAL | Status: DC | PRN

## 2014-06-01 NOTE — Progress Notes (Signed)
Pt. Is stable and ready to be discharged home. All belongings are with the patient. Pt. Verbalized understanding about prescriptions and discharge instructions. Pt. Wheeled out via wheelchair to FOB car.

## 2014-06-09 NOTE — Discharge Summary (Signed)
Discharge diagnosis: Preterm contractions without progression into preterm labor.

## 2014-06-11 ENCOUNTER — Emergency Department (HOSPITAL_COMMUNITY)
Admission: EM | Admit: 2014-06-11 | Discharge: 2014-06-11 | Disposition: A | Discharge status: Home / Self Care | Payer: 59 | Source: Home / Self Care | Attending: Family Medicine | Admitting: Family Medicine

## 2014-06-11 ENCOUNTER — Encounter (HOSPITAL_COMMUNITY): Payer: Self-pay | Admitting: Emergency Medicine

## 2014-06-11 DIAGNOSIS — R69 Illness, unspecified: Principal | ICD-10-CM

## 2014-06-11 DIAGNOSIS — J111 Influenza due to unidentified influenza virus with other respiratory manifestations: Secondary | ICD-10-CM | POA: Diagnosis not present

## 2014-06-11 LAB — CBC
HCT: 39.7 % (ref 36.0–46.0)
Hemoglobin: 13.4 g/dL (ref 12.0–15.0)
MCH: 28 pg (ref 26.0–34.0)
MCHC: 33.8 g/dL (ref 30.0–36.0)
MCV: 82.9 fL (ref 78.0–100.0)
Platelets: 259 10*3/uL (ref 150–400)
RBC: 4.79 MIL/uL (ref 3.87–5.11)
RDW: 13 % (ref 11.5–15.5)
WBC: 13.1 10*3/uL — ABNORMAL HIGH (ref 4.0–10.5)

## 2014-06-11 LAB — POCT RAPID STREP A: Streptococcus, Group A Screen (Direct): NEGATIVE

## 2014-06-11 LAB — POCT I-STAT, CHEM 8
BUN: <3 mg/dL — ABNORMAL LOW (ref 6–23)
Calcium, Ion: 1.02 mmol/L — ABNORMAL LOW (ref 1.12–1.23)
Chloride: 104 mEq/L (ref 96–112)
Creatinine, Ser: 0.9 mg/dL (ref 0.50–1.10)
Glucose, Bld: 94 mg/dL (ref 70–99)
HCT: 46 % (ref 36.0–46.0)
Hemoglobin: 15.6 g/dL — ABNORMAL HIGH (ref 12.0–15.0)
Potassium: 3.8 mEq/L (ref 3.7–5.3)
Sodium: 137 mEq/L (ref 137–147)
TCO2: 24 mmol/L (ref 0–100)

## 2014-06-11 MED ORDER — TRAMADOL HCL 50 MG PO TABS: 50.0000 mg | ORAL_TABLET | Freq: Four times a day (QID) | ORAL | Status: DC | PRN

## 2014-06-11 MED ORDER — SODIUM CHLORIDE 0.9 % IV BOLUS (SEPSIS)
1000.0000 mL | Freq: Once | INTRAVENOUS | Status: AC
  Administered 2014-06-11: 1000 mL via INTRAVENOUS

## 2014-06-11 NOTE — ED Notes (Signed)
C/o constant HA onset 1 week Sx also include hives all over body and chills Has been taking OTC pain meds w/temp relief. Alert, no signs of acute distress.

## 2014-06-11 NOTE — ED Notes (Signed)
Blood specimens are going to be obtained prior to the IV start

## 2014-06-11 NOTE — Discharge Instructions (Signed)
Thank you for coming in today. Take Tylenol or ibuprofen when you get home.  Take tramadol for pain as needed If you get worse go to the emergency room Call or go to the emergency room if you get worse, have trouble breathing, have chest pains, or palpitations.   Influenza Influenza ("the flu") is a viral infection of the respiratory tract. It occurs more often in winter months because people spend more time in close contact with one another. Influenza can make you feel very sick. Influenza easily spreads from person to person (contagious). CAUSES  Influenza is caused by a virus that infects the respiratory tract. You can catch the virus by breathing in droplets from an infected person's cough or sneeze. You can also catch the virus by touching something that was recently contaminated with the virus and then touching your mouth, nose, or eyes. RISKS AND COMPLICATIONS You may be at risk for a more severe case of influenza if you smoke cigarettes, have diabetes, have chronic heart disease (such as heart failure) or lung disease (such as asthma), or if you have a weakened immune system. Elderly people and pregnant women are also at risk for more serious infections. The most common problem of influenza is a lung infection (pneumonia). Sometimes, this problem can require emergency medical care and may be life threatening. SIGNS AND SYMPTOMS  Symptoms typically last 4 to 10 days and may include:  Fever.  Chills.  Headache, body aches, and muscle aches.  Sore throat.  Chest discomfort and cough.  Poor appetite.  Weakness or feeling tired.  Dizziness.  Nausea or vomiting. DIAGNOSIS  Diagnosis of influenza is often made based on your history and a physical exam. A nose or throat swab test can be done to confirm the diagnosis. TREATMENT  In mild cases, influenza goes away on its own. Treatment is directed at relieving symptoms. For more severe cases, your health care provider may prescribe  antiviral medicines to shorten the sickness. Antibiotic medicines are not effective because the infection is caused by a virus, not by bacteria. HOME CARE INSTRUCTIONS  Take medicines only as directed by your health care provider.  Use a cool mist humidifier to make breathing easier.  Get plenty of rest until your temperature returns to normal. This usually takes 3 to 4 days.  Drink enough fluid to keep your urine clear or pale yellow.  Cover yourmouth and nosewhen coughing or sneezing,and wash your handswellto prevent thevirusfrom spreading.  Stay homefromwork orschool untilthe fever is gonefor at least 411full day. PREVENTION  An annual influenza vaccination (flu shot) is the best way to avoid getting influenza. An annual flu shot is now routinely recommended for all adults in the [place] SEEK MEDICAL CARE IF:  You experiencechest pain, yourcough worsens,or you producemore mucus.  Youhave nausea,vomiting, ordiarrhea.  Your fever returns or gets worse. SEEK IMMEDIATE MEDICAL CARE IF:  You havetrouble breathing, you become short of breath,or your skin ornails becomebluish.  You have severe painor stiffnessin the neck.  You develop a sudden headache, or pain in the face or ear.  You have nausea or vomiting that you cannot control. MAKE SURE YOU:   Understand these instructions.  Will watch your condition.  Will get help right away if you are not doing well or get worse. Document Released: 08/19/2000 Document Revised: 01/06/2014 Document Reviewed: 11/21/2011 [facility] Patient Information 2015 [employer], [place]. This information is not intended to replace advice given to you by your health care provider. Make sure you  discuss any questions you have with your health care provider. ° °

## 2014-06-11 NOTE — ED Provider Notes (Signed)
Melissa Diaz is a 23 y.o. female who presents to Urgent Care today for headache body ache cough congestion fatigue. Symptoms present for around 5 days. Patient has tried Aleve Tylenol and getting better which have not helped. No weakness or numbness or loss of function. No chest pains palpitations or shortness of breath. Patient additionally notes high as high. However she develops hives whenever she has a fever or gets sick. No soaps or detergent or shampoos.   Past Medical History  Diagnosis Date  . No pertinent past medical history    History  Substance Use Topics  . Smoking status: Never Smoker   . Smokeless tobacco: Never Used  . Alcohol Use: No   ROS as above Medications: No current facility-administered medications for this encounter.   Current Outpatient Prescriptions  Medication Sig Dispense Refill  . EPINEPHrine (EPIPEN) 0.3 mg/0.3 mL IJ SOAJ injection Inject 0.3 mLs (0.3 mg total) into the skin once.  1 Device  0  . hydrOXYzine (ATARAX/VISTARIL) 25 MG tablet Take 1 tablet (25 mg total) by mouth 3 (three) times daily.  90 tablet  0  . predniSONE (DELTASONE) 20 MG tablet Take 3 daily for 5 days, 2 daily for 5 days, 1 daily for 5 days.  30 tablet  0  . ranitidine (ZANTAC) 150 MG tablet Take 1 tablet (150 mg total) by mouth 2 (two) times daily.  60 tablet  0    Exam:  BP 128/83  Pulse 139  Temp(Src) 101.3 F (38.5 C) (Oral)  Resp 18  SpO2 100%  LMP 05/15/2014 Orthostatic VS for the past 24 hrs:  BP- Lying Pulse- Lying BP- Sitting Pulse- Sitting BP- Standing at 0 minutes Pulse- Standing at 0 minutes  06/11/14 1228 115/66 mmHg 128 112/86 mmHg 128 92/66 mmHg 135   Gen: Well NAD nontoxic appearing HEENT: EOMI,  MMM posterior pharynx is erythematous. Tympanic membranes are normal appearing bilaterally. Nontender maxillary and frontal sinuses bilaterally Lungs: Normal work of breathing. CTABL Heart: Tachycardia but regular no MRG Abd: NABS, Soft. Nondistended,  Nontender Exts: Brisk capillary refill, warm and well perfused.  Neck: No meningismus supple.  Patient was given 1 L of normal saline. Her heart rate decreased and her blood pressure increased however she continued to have headaches.  Twelve-lead EKG shows sinus tachycardia at a rate of 122 beats per minute. No Q waves. No ST segment elevation or depression.  Results for orders placed during the hospital encounter of 06/11/14 (from the past 24 hour(s))  CBC     Status: Abnormal   Collection Time    06/11/14 12:25 PM      Result Value Ref Range   WBC 13.1 (*) 4.0 - 10.5 K/uL   RBC 4.79  3.87 - 5.11 MIL/uL   Hemoglobin 13.4  12.0 - 15.0 g/dL   HCT 16.1  09.6 - 04.5 %   MCV 82.9  78.0 - 100.0 fL   MCH 28.0  26.0 - 34.0 pg   MCHC 33.8  30.0 - 36.0 g/dL   RDW 40.9  81.1 - 91.4 %   Platelets 259  150 - 400 K/uL  POCT RAPID STREP A ([facility] ONLY)     Status: None   Collection Time    06/11/14 12:42 PM      Result Value Ref Range   Streptococcus, Group A Screen (Direct) NEGATIVE  NEGATIVE  POCT I-STAT, CHEM 8     Status: Abnormal   Collection Time    06/11/14  1:27 PM      Result Value Ref Range   Sodium 137  137 - 147 mEq/L   Potassium 3.8  3.7 - 5.3 mEq/L   Chloride 104  96 - 112 mEq/L   BUN <3 (*) 6 - 23 mg/dL   Creatinine, Ser 4.090.90  0.50 - 1.10 mg/dL   Glucose, Bld 94  70 - 99 mg/dL   Calcium, Ion 8.111.02 (*) 1.12 - 1.23 mmol/L   TCO2 24  0 - 100 mmol/L   Hemoglobin 15.6 (*) 12.0 - 15.0 g/dL   HCT 91.446.0  78.236.0 - 95.646.0 %   No results found.  Assessment and Plan: 23 y.o. female with viral URI. Patient may have influenza. She is tachycardic and febrile and with a very mildly elevated white blood cell count. We discussed options. At this time they would like to avoid the emergency room on like to try watchful waiting with Tylenol and ibuprofen. I am doubtful for serious bacterial etiology. If not better followup with the emergency room.  Discussed warning signs or symptoms.  Please see discharge instructions. Patient expresses understanding.    Rodolph BongEvan S Zeya Balles, MD 06/11/14 [phone]

## 2014-06-12 ENCOUNTER — Encounter (HOSPITAL_COMMUNITY): Payer: Self-pay | Admitting: Emergency Medicine

## 2014-06-12 ENCOUNTER — Inpatient Hospital Stay (HOSPITAL_COMMUNITY)
Admission: EM | Admit: 2014-06-12 | Discharge: 2014-06-16 | DRG: 076 | Disposition: A | Discharge status: Home / Self Care | Payer: 59 | Attending: Internal Medicine | Admitting: Internal Medicine

## 2014-06-12 DIAGNOSIS — K5900 Constipation, unspecified: Secondary | ICD-10-CM | POA: Diagnosis present

## 2014-06-12 DIAGNOSIS — Z825 Family history of asthma and other chronic lower respiratory diseases: Secondary | ICD-10-CM

## 2014-06-12 DIAGNOSIS — G039 Meningitis, unspecified: Secondary | ICD-10-CM | POA: Diagnosis not present

## 2014-06-12 DIAGNOSIS — R509 Fever, unspecified: Secondary | ICD-10-CM

## 2014-06-12 DIAGNOSIS — Z823 Family history of stroke: Secondary | ICD-10-CM

## 2014-06-12 DIAGNOSIS — L299 Pruritus, unspecified: Secondary | ICD-10-CM | POA: Diagnosis present

## 2014-06-12 DIAGNOSIS — D649 Anemia, unspecified: Secondary | ICD-10-CM | POA: Diagnosis present

## 2014-06-12 DIAGNOSIS — Z91040 Latex allergy status: Secondary | ICD-10-CM

## 2014-06-12 DIAGNOSIS — T783XXA Angioneurotic edema, initial encounter: Secondary | ICD-10-CM

## 2014-06-12 DIAGNOSIS — A870 Enteroviral meningitis: Secondary | ICD-10-CM | POA: Diagnosis not present

## 2014-06-12 DIAGNOSIS — G4441 Drug-induced headache, not elsewhere classified, intractable: Secondary | ICD-10-CM

## 2014-06-12 DIAGNOSIS — E876 Hypokalemia: Secondary | ICD-10-CM | POA: Diagnosis present

## 2014-06-12 DIAGNOSIS — Z8249 Family history of ischemic heart disease and other diseases of the circulatory system: Secondary | ICD-10-CM

## 2014-06-12 DIAGNOSIS — L508 Other urticaria: Secondary | ICD-10-CM | POA: Diagnosis present

## 2014-06-12 DIAGNOSIS — D72829 Elevated white blood cell count, unspecified: Secondary | ICD-10-CM | POA: Diagnosis present

## 2014-06-12 DIAGNOSIS — N390 Urinary tract infection, site not specified: Secondary | ICD-10-CM

## 2014-06-12 DIAGNOSIS — Z79899 Other long term (current) drug therapy: Secondary | ICD-10-CM

## 2014-06-12 DIAGNOSIS — Z888 Allergy status to other drugs, medicaments and biological substances status: Secondary | ICD-10-CM

## 2014-06-12 DIAGNOSIS — G030 Nonpyogenic meningitis: Secondary | ICD-10-CM

## 2014-06-12 DIAGNOSIS — R519 Headache, unspecified: Secondary | ICD-10-CM | POA: Diagnosis present

## 2014-06-12 DIAGNOSIS — A879 Viral meningitis, unspecified: Secondary | ICD-10-CM | POA: Diagnosis present

## 2014-06-12 DIAGNOSIS — J029 Acute pharyngitis, unspecified: Secondary | ICD-10-CM | POA: Diagnosis present

## 2014-06-12 DIAGNOSIS — L509 Urticaria, unspecified: Secondary | ICD-10-CM

## 2014-06-12 DIAGNOSIS — R51 Headache: Secondary | ICD-10-CM

## 2014-06-12 HISTORY — DX: Urticaria, unspecified: L50.9

## 2014-06-12 MED ORDER — SODIUM CHLORIDE 0.9 % IV BOLUS (SEPSIS): 1000.0000 mL | Freq: Once | INTRAVENOUS | Status: DC

## 2014-06-12 MED ORDER — SODIUM CHLORIDE 0.9 % IV BOLUS (SEPSIS)
2000.0000 mL | Freq: Once | INTRAVENOUS | Status: AC
  Administered 2014-06-13: 1000 mL via INTRAVENOUS

## 2014-06-12 MED ORDER — KETOROLAC TROMETHAMINE 30 MG/ML IJ SOLN
30.0000 mg | Freq: Once | INTRAMUSCULAR | Status: AC
  Administered 2014-06-13: 30 mg via INTRAVENOUS
  Filled 2014-06-12: qty 1

## 2014-06-12 MED ORDER — ACETAMINOPHEN 500 MG PO TABS
1000.0000 mg | ORAL_TABLET | Freq: Once | ORAL | Status: AC
  Administered 2014-06-13: 1000 mg via ORAL
  Filled 2014-06-12: qty 2

## 2014-06-12 MED ORDER — ONDANSETRON HCL 4 MG/2ML IJ SOLN
4.0000 mg | Freq: Once | INTRAMUSCULAR | Status: AC
  Administered 2014-06-13: 4 mg via INTRAVENOUS
  Filled 2014-06-12: qty 2

## 2014-06-12 NOTE — ED Notes (Signed)
Pt presents with multiple complaints. Pt was seen at urgent care yesterday and diagnosed her with flu-like symptoms. Pt presents today and her symptoms have not gotten any better. Pt reports with nausea, generalized body aches, fever, and chills.

## 2014-06-12 NOTE — ED Provider Notes (Signed)
TIME SEEN: 11:55 PM  CHIEF COMPLAINT: Body aches, headache, fever  HPI: Patient is a 23 year old female with no significant past medical history who presents emergency department with complaints of 6 days of fever, body aches, headache, nausea. She denies any cough, nasal congestion or sore throat. She was in urgent care yesterday and diagnosed with flulike symptoms. She did not have a flu test. She did have labs that showed a leukocytosis of 13.2. She was discharge with prescription for tramadol for her headache. She reports that she has taken Tylenol today but no Tylenol or ibuprofen. Denies any vomiting or diarrhea. Has had a mildly pruritic rash on her upper extremities but no petechiae or purpura. No sick contacts or recent travel.  ROS: See HPI Constitutional: no fever  Eyes: no drainage  ENT: no runny nose   Cardiovascular:  no chest pain  Resp: no SOB  GI: no vomiting GU: no dysuria Integumentary: no rash  Allergy: no hives  Musculoskeletal: no leg swelling  Neurological: no slurred speech ROS otherwise negative  PAST MEDICAL HISTORY/PAST SURGICAL HISTORY:  Past Medical History  Diagnosis Date  . No pertinent past medical history     MEDICATIONS:  Prior to Admission medications   Medication Sig Start Date End Date Taking? Authorizing Provider  EPINEPHrine (EPIPEN) 0.3 mg/0.3 mL IJ SOAJ injection Inject 0.3 mLs (0.3 mg total) into the skin once. 03/02/14   Reuben Likes, MD  hydrOXYzine (ATARAX/VISTARIL) 25 MG tablet Take 1 tablet (25 mg total) by mouth 3 (three) times daily. 03/02/14   Reuben Likes, MD  predniSONE (DELTASONE) 20 MG tablet Take 3 daily for 5 days, 2 daily for 5 days, 1 daily for 5 days. 03/02/14   Reuben Likes, MD  ranitidine (ZANTAC) 150 MG tablet Take 1 tablet (150 mg total) by mouth 2 (two) times daily. 03/02/14   Reuben Likes, MD  traMADol (ULTRAM) 50 MG tablet Take 1 tablet (50 mg total) by mouth every 6 (six) hours as needed. 06/11/14   Rodolph Bong,  MD    ALLERGIES:  Allergies  Allergen Reactions  . Latex Hives  . Loratadine     ?  hives    SOCIAL HISTORY:  History  Substance Use Topics  . Smoking status: Never Smoker   . Smokeless tobacco: Never Used  . Alcohol Use: No    FAMILY HISTORY: Family History  Problem Relation Age of Onset  . Asthma Brother   . Hypertension Maternal Grandmother   . Arthritis Maternal Grandmother   . Stroke Paternal Grandmother     EXAM: BP 130/76  Pulse 132  Temp(Src) 99.9 F (37.7 C) (Oral)  Resp 22  SpO2 99%  LMP 05/15/2014 CONSTITUTIONAL: Alert and oriented and responds appropriately to questions. Well-appearing; well-nourished HEAD: Normocephalic EYES: Conjunctivae clear, PERRL ENT: normal nose; no rhinorrhea; moist mucous membranes; pharynx without lesions noted, no tonsillar hypertrophy or exudate NECK: Supple, no meningismus, no LAD  CARD: RRR; S1 and S2 appreciated; no murmurs, no clicks, no rubs, no gallops RESP: Normal chest excursion without splinting or tachypnea; breath sounds clear and equal bilaterally; no wheezes, no rhonchi, no rales, no hypoxia or respiratory distress ABD/GI: Normal bowel sounds; non-distended; soft, non-tender, no rebound, no guarding BACK:  The back appears normal and is non-tender to palpation, there is no CVA tenderness; negative Kernig's, negative Brudzinski EXT: Normal ROM in all joints; non-tender to palpation; no edema; normal capillary refill; no cyanosis    SKIN: Normal color for  age and race; warm, mild papular lesions noted on the inside of the upper arms bilaterally, no petechiae or purpura, no blisters, no rash on her palms or soles NEURO: Moves all extremities equally PSYCH: The patient's mood and manner are appropriate. Grooming and personal hygiene are appropriate.  MEDICAL DECISION MAKING: Patient here with complaints of headache, body aches, nausea, fever. I low suspicion for meningitis. Suspect her headache is likely because she  is febrile in the ED. We'll give IV fluids, Toradol and Tylenol. Low suspicion for bacterial meningitis given symptoms have been present for 6 days. She had a negative strep test yesterday in urgent care. She has had no cough or congestion to suggest pneumonia in her lungs are clear. We'll repeat labs and urine.  ED PROGRESS: Patient's labs show leukocytosis of 20.1 with left shift. Chest x-ray clear. Urine shows small leukocytes, trace hemoglobin and many bacteria. Culture is pending. Patient is still complaining of headache and also now neck stiffness and pain. Lumbar puncture performed. CSF shows no organisms but there are 16 WBCs and 9 RBCs. Influenza swab pending. We'll give vancomycin, ceftriaxone. Discussed with hospitalist for admission. Suspect viral illness versus viral meningitis versus UTI.     EKG Interpretation  Date/Time:  Thursday June 12 2014 23:44:59 EDT Ventricular Rate:  122 PR Interval:  123 QRS Duration: 73 QT Interval:  289 QTC Calculation: 412 R Axis:   27 Text Interpretation:  Sinus tachycardia Borderline repolarization abnormality Confirmed by Jhordyn Hoopingarner,  DO, Zamar Odwyer ([id number]) on 06/13/2014 1:39:47 AM           LUMBAR PUNCTURE Date/Time: 06/13/2014 3:27 AM Performed by: Raelyn Number Authorized by: Raelyn Number Consent: Verbal consent obtained. written consent obtained. Risks and benefits: risks, benefits and alternatives were discussed Consent given by: patient Patient understanding: patient states understanding of the procedure being performed Patient consent: the patient's understanding of the procedure matches consent given Procedure consent: procedure consent matches procedure scheduled Relevant documents: relevant documents present and verified Test results: test results available and properly labeled Site marked: the operative site was marked Required items: required blood products, implants, devices, and special equipment available Patient  identity confirmed: verbally with patient and arm band Time out: Immediately prior to procedure a "time out" was called to verify the correct patient, procedure, equipment, support staff and site/side marked as required. Indications: evaluation for infection Anesthesia: local infiltration Local anesthetic: lidocaine 1% without epinephrine Anesthetic total: 5 ml Patient sedated: no Preparation: Patient was prepped and draped in the usual sterile fashion. Lumbar space: L3-L4 interspace Patient's position: sitting Needle gauge: 18 Needle type: spinal needle - Quincke tip Needle length: 1.5 in Number of attempts: 1 Fluid appearance: clear Tubes of fluid: 4 Total volume: 10 ml Post-procedure: site cleaned and pressure dressing applied Patient tolerance: Patient tolerated the procedure well with no immediate complications.    CRITICAL CARE Performed by: Raelyn Number   Total critical care time: 35 minutes  Critical care time was exclusive of separately billable procedures and treating other patients.  Critical care was necessary to treat or prevent imminent or life-threatening deterioration.  Critical care was time spent personally by me on the following activities: development of treatment plan with patient and/or surrogate as well as nursing, discussions with consultants, evaluation of patient's response to treatment, examination of patient, obtaining history from patient or surrogate, ordering and performing treatments and interventions, ordering and review of laboratory studies, ordering and review of radiographic studies, pulse oximetry and re-evaluation of  patient's condition.   Layla MawKristen N Zaylon Bossier, DO 06/13/14 [phone]

## 2014-06-12 NOTE — ED Notes (Signed)
Patient reports having a severe headache and body aches.  She was seen and treated for the flu 06/11/14 at urgent care.  She has visible hives along her upper chest and arms, which she reports are chronic and worsen with illness.

## 2014-06-12 NOTE — ED Notes (Signed)
MD at bedside. 

## 2014-06-13 ENCOUNTER — Inpatient Hospital Stay (HOSPITAL_COMMUNITY): Payer: 59

## 2014-06-13 ENCOUNTER — Emergency Department (HOSPITAL_COMMUNITY): Payer: 59

## 2014-06-13 ENCOUNTER — Encounter (HOSPITAL_COMMUNITY): Payer: Self-pay | Admitting: Internal Medicine

## 2014-06-13 DIAGNOSIS — E876 Hypokalemia: Secondary | ICD-10-CM | POA: Diagnosis present

## 2014-06-13 DIAGNOSIS — G4441 Drug-induced headache, not elsewhere classified, intractable: Secondary | ICD-10-CM

## 2014-06-13 DIAGNOSIS — Z79899 Other long term (current) drug therapy: Secondary | ICD-10-CM | POA: Diagnosis not present

## 2014-06-13 DIAGNOSIS — Z825 Family history of asthma and other chronic lower respiratory diseases: Secondary | ICD-10-CM | POA: Diagnosis not present

## 2014-06-13 DIAGNOSIS — D72829 Elevated white blood cell count, unspecified: Secondary | ICD-10-CM | POA: Diagnosis present

## 2014-06-13 DIAGNOSIS — A870 Enteroviral meningitis: Secondary | ICD-10-CM | POA: Diagnosis present

## 2014-06-13 DIAGNOSIS — R509 Fever, unspecified: Secondary | ICD-10-CM | POA: Diagnosis not present

## 2014-06-13 DIAGNOSIS — Z888 Allergy status to other drugs, medicaments and biological substances status: Secondary | ICD-10-CM | POA: Diagnosis not present

## 2014-06-13 DIAGNOSIS — Z91040 Latex allergy status: Secondary | ICD-10-CM | POA: Diagnosis not present

## 2014-06-13 DIAGNOSIS — G030 Nonpyogenic meningitis: Secondary | ICD-10-CM | POA: Diagnosis not present

## 2014-06-13 DIAGNOSIS — G039 Meningitis, unspecified: Secondary | ICD-10-CM | POA: Insufficient documentation

## 2014-06-13 DIAGNOSIS — T783XXA Angioneurotic edema, initial encounter: Secondary | ICD-10-CM | POA: Diagnosis present

## 2014-06-13 DIAGNOSIS — L299 Pruritus, unspecified: Secondary | ICD-10-CM | POA: Diagnosis present

## 2014-06-13 DIAGNOSIS — L508 Other urticaria: Secondary | ICD-10-CM | POA: Diagnosis present

## 2014-06-13 DIAGNOSIS — K5900 Constipation, unspecified: Secondary | ICD-10-CM | POA: Diagnosis not present

## 2014-06-13 DIAGNOSIS — Z823 Family history of stroke: Secondary | ICD-10-CM | POA: Diagnosis not present

## 2014-06-13 DIAGNOSIS — R519 Headache, unspecified: Secondary | ICD-10-CM | POA: Diagnosis present

## 2014-06-13 DIAGNOSIS — R51 Headache: Secondary | ICD-10-CM | POA: Diagnosis not present

## 2014-06-13 DIAGNOSIS — J029 Acute pharyngitis, unspecified: Secondary | ICD-10-CM | POA: Diagnosis not present

## 2014-06-13 DIAGNOSIS — D649 Anemia, unspecified: Secondary | ICD-10-CM | POA: Diagnosis present

## 2014-06-13 DIAGNOSIS — Z8249 Family history of ischemic heart disease and other diseases of the circulatory system: Secondary | ICD-10-CM | POA: Diagnosis not present

## 2014-06-13 LAB — INFLUENZA PANEL BY PCR (TYPE A & B)
H1N1 flu by pcr: NOT DETECTED
Influenza A By PCR: NEGATIVE
Influenza B By PCR: NEGATIVE

## 2014-06-13 LAB — CBC WITH DIFFERENTIAL/PLATELET
Basophils Absolute: 0 10*3/uL (ref 0.0–0.1)
Basophils Absolute: 0 10*3/uL (ref 0.0–0.1)
Basophils Relative: 0 % (ref 0–1)
Basophils Relative: 0 % (ref 0–1)
Eosinophils Absolute: 0.1 10*3/uL (ref 0.0–0.7)
Eosinophils Absolute: 0.1 10*3/uL (ref 0.0–0.7)
Eosinophils Relative: 0 % (ref 0–5)
Eosinophils Relative: 1 % (ref 0–5)
HCT: 36.1 % (ref 36.0–46.0)
HCT: 32 % — ABNORMAL LOW (ref 36.0–46.0)
Hemoglobin: 11.7 g/dL — ABNORMAL LOW (ref 12.0–15.0)
Hemoglobin: 10.6 g/dL — ABNORMAL LOW (ref 12.0–15.0)
Lymphocytes Relative: 5 % — ABNORMAL LOW (ref 12–46)
Lymphocytes Relative: 10 % — ABNORMAL LOW (ref 12–46)
Lymphs Abs: 1 10*3/uL (ref 0.7–4.0)
Lymphs Abs: 1.5 10*3/uL (ref 0.7–4.0)
MCH: 27.1 pg (ref 26.0–34.0)
MCH: 27.2 pg (ref 26.0–34.0)
MCHC: 32.4 g/dL (ref 30.0–36.0)
MCHC: 33.1 g/dL (ref 30.0–36.0)
MCV: 83.6 fL (ref 78.0–100.0)
MCV: 82.3 fL (ref 78.0–100.0)
Monocytes Absolute: 1.6 10*3/uL — ABNORMAL HIGH (ref 0.1–1.0)
Monocytes Absolute: 1 10*3/uL (ref 0.1–1.0)
Monocytes Relative: 8 % (ref 3–12)
Monocytes Relative: 7 % (ref 3–12)
Neutro Abs: 17.4 10*3/uL — ABNORMAL HIGH (ref 1.7–7.7)
Neutro Abs: 11.6 10*3/uL — ABNORMAL HIGH (ref 1.7–7.7)
Neutrophils Relative %: 87 % — ABNORMAL HIGH (ref 43–77)
Neutrophils Relative %: 82 % — ABNORMAL HIGH (ref 43–77)
Platelets: 261 10*3/uL (ref 150–400)
Platelets: 257 10*3/uL (ref 150–400)
RBC: 4.32 MIL/uL (ref 3.87–5.11)
RBC: 3.89 MIL/uL (ref 3.87–5.11)
RDW: 12.9 % (ref 11.5–15.5)
RDW: 13.1 % (ref 11.5–15.5)
WBC: 20.1 10*3/uL — ABNORMAL HIGH (ref 4.0–10.5)
WBC: 14.2 10*3/uL — ABNORMAL HIGH (ref 4.0–10.5)

## 2014-06-13 LAB — URINALYSIS, ROUTINE W REFLEX MICROSCOPIC
Glucose, UA: NEGATIVE mg/dL
Ketones, ur: >80 mg/dL — AB
Nitrite: NEGATIVE
Protein, ur: NEGATIVE mg/dL
Specific Gravity, Urine: 1.02 (ref 1.005–1.030)
Urobilinogen, UA: 1 mg/dL (ref 0.0–1.0)
pH: 6 (ref 5.0–8.0)

## 2014-06-13 LAB — COMPREHENSIVE METABOLIC PANEL
ALT: 22 U/L (ref 0–35)
ALT: 19 U/L (ref 0–35)
AST: 22 U/L (ref 0–37)
AST: 18 U/L (ref 0–37)
Albumin: 2.8 g/dL — ABNORMAL LOW (ref 3.5–5.2)
Albumin: 2.5 g/dL — ABNORMAL LOW (ref 3.5–5.2)
Alkaline Phosphatase: 82 U/L (ref 39–117)
Alkaline Phosphatase: 71 U/L (ref 39–117)
Anion gap: 16 — ABNORMAL HIGH (ref 5–15)
Anion gap: 14 (ref 5–15)
BUN: 3 mg/dL — ABNORMAL LOW (ref 6–23)
BUN: 3 mg/dL — ABNORMAL LOW (ref 6–23)
CO2: 21 mEq/L (ref 19–32)
CO2: 23 mEq/L (ref 19–32)
Calcium: 8.4 mg/dL (ref 8.4–10.5)
Calcium: 8 mg/dL — ABNORMAL LOW (ref 8.4–10.5)
Chloride: 98 mEq/L (ref 96–112)
Chloride: 102 mEq/L (ref 96–112)
Creatinine, Ser: 0.74 mg/dL (ref 0.50–1.10)
Creatinine, Ser: 0.75 mg/dL (ref 0.50–1.10)
GFR calc Af Amer: >90 mL/min (ref >90)
GFR calc Af Amer: >90 mL/min (ref >90)
GFR calc non Af Amer: >90 mL/min (ref >90)
GFR calc non Af Amer: >90 mL/min (ref >90)
Glucose, Bld: 90 mg/dL (ref 70–99)
Glucose, Bld: 90 mg/dL (ref 70–99)
Potassium: 3.6 mEq/L — ABNORMAL LOW (ref 3.7–5.3)
Potassium: 3.3 mEq/L — ABNORMAL LOW (ref 3.7–5.3)
Sodium: 135 mEq/L — ABNORMAL LOW (ref 137–147)
Sodium: 139 mEq/L (ref 137–147)
Total Bilirubin: 0.6 mg/dL (ref 0.3–1.2)
Total Bilirubin: 0.4 mg/dL (ref 0.3–1.2)
Total Protein: 7.2 g/dL (ref 6.0–8.3)
Total Protein: 6.4 g/dL (ref 6.0–8.3)

## 2014-06-13 LAB — CULTURE, GROUP A STREP

## 2014-06-13 LAB — URINE MICROSCOPIC-ADD ON

## 2014-06-13 LAB — PATHOLOGIST SMEAR REVIEW

## 2014-06-13 LAB — TSH: TSH: 2.23 u[IU]/mL (ref 0.350–4.500)

## 2014-06-13 LAB — HSV DNA BY PCR (REFERENCE LAB)
HSV 1 DNA: NOT DETECTED
HSV 2 DNA: NOT DETECTED

## 2014-06-13 LAB — CSF CELL COUNT WITH DIFFERENTIAL
Lymphs, CSF: 16 % — ABNORMAL LOW (ref 40–80)
Monocyte-Macrophage-Spinal Fluid: 25 % (ref 15–45)
RBC Count, CSF: 9 /mm3 — ABNORMAL HIGH
Segmented Neutrophils-CSF: 59 % — ABNORMAL HIGH (ref 0–6)
Tube #: 4
WBC, CSF: 16 /mm3 (ref 0–5)

## 2014-06-13 LAB — HIV ANTIBODY (ROUTINE TESTING W REFLEX): HIV 1&2 Ab, 4th Generation: NONREACTIVE

## 2014-06-13 LAB — POC URINE PREG, ED: Preg Test, Ur: NEGATIVE

## 2014-06-13 LAB — PROTEIN AND GLUCOSE, CSF
Glucose, CSF: 60 mg/dL (ref 43–76)
Total  Protein, CSF: 15 mg/dL (ref 15–45)

## 2014-06-13 LAB — CRYPTOCOCCAL ANTIGEN, CSF: Crypto Ag: NEGATIVE

## 2014-06-13 LAB — I-STAT CG4 LACTIC ACID, ED: Lactic Acid, Venous: 0.78 mmol/L (ref 0.5–2.2)

## 2014-06-13 MED ORDER — FAMOTIDINE 20 MG PO TABS
20.0000 mg | ORAL_TABLET | Freq: Two times a day (BID) | ORAL | Status: DC
  Administered 2014-06-13 – 2014-06-16 (×7): 20 mg via ORAL
  Filled 2014-06-13 (×8): qty 1

## 2014-06-13 MED ORDER — POTASSIUM CHLORIDE CRYS ER 20 MEQ PO TBCR
20.0000 meq | EXTENDED_RELEASE_TABLET | Freq: Two times a day (BID) | ORAL | Status: AC
  Administered 2014-06-13 (×2): 20 meq via ORAL
  Filled 2014-06-13 (×2): qty 1

## 2014-06-13 MED ORDER — GADOBENATE DIMEGLUMINE 529 MG/ML IV SOLN
20.0000 mL | Freq: Once | INTRAVENOUS | Status: AC | PRN
  Administered 2014-06-13: 20 mL via INTRAVENOUS

## 2014-06-13 MED ORDER — SODIUM CHLORIDE 0.9 % IV SOLN
INTRAVENOUS | Status: AC
  Administered 2014-06-13 (×2): 1000 mL via INTRAVENOUS

## 2014-06-13 MED ORDER — ONDANSETRON HCL 4 MG/2ML IJ SOLN
4.0000 mg | Freq: Four times a day (QID) | INTRAMUSCULAR | Status: DC | PRN
  Administered 2014-06-13 – 2014-06-14 (×2): 4 mg via INTRAVENOUS
  Filled 2014-06-13 (×3): qty 2

## 2014-06-13 MED ORDER — MORPHINE SULFATE 4 MG/ML IJ SOLN
INTRAMUSCULAR | Status: AC
  Filled 2014-06-13: qty 1

## 2014-06-13 MED ORDER — ACETAMINOPHEN 650 MG RE SUPP: 650.0000 mg | Freq: Four times a day (QID) | RECTAL | Status: DC | PRN

## 2014-06-13 MED ORDER — DEXTROSE 5 % IV SOLN
2.0000 g | Freq: Once | INTRAVENOUS | Status: AC
  Administered 2014-06-13: 2 g via INTRAVENOUS
  Filled 2014-06-13: qty 2

## 2014-06-13 MED ORDER — ONDANSETRON HCL 4 MG PO TABS: 4.0000 mg | ORAL_TABLET | Freq: Four times a day (QID) | ORAL | Status: DC | PRN

## 2014-06-13 MED ORDER — OXYCODONE-ACETAMINOPHEN 5-325 MG PO TABS
1.0000 | ORAL_TABLET | ORAL | Status: DC | PRN
  Administered 2014-06-13 – 2014-06-16 (×11): 2 via ORAL
  Filled 2014-06-13 (×11): qty 2

## 2014-06-13 MED ORDER — LIDOCAINE HCL 1 % IJ SOLN
INTRAMUSCULAR | Status: AC
  Administered 2014-06-13: 20 mL
  Filled 2014-06-13: qty 20

## 2014-06-13 MED ORDER — IBUPROFEN 800 MG PO TABS
800.0000 mg | ORAL_TABLET | Freq: Once | ORAL | Status: AC
  Administered 2014-06-13: 800 mg via ORAL
  Filled 2014-06-13: qty 1

## 2014-06-13 MED ORDER — DEXTROSE 5 % IV SOLN
550.0000 mg | Freq: Three times a day (TID) | INTRAVENOUS | Status: DC
  Administered 2014-06-13 – 2014-06-14 (×3): 550 mg via INTRAVENOUS
  Filled 2014-06-13 (×5): qty 11

## 2014-06-13 MED ORDER — ACETAMINOPHEN 325 MG PO TABS: 650.0000 mg | ORAL_TABLET | Freq: Four times a day (QID) | ORAL | Status: DC | PRN

## 2014-06-13 MED ORDER — MORPHINE SULFATE 2 MG/ML IJ SOLN
1.0000 mg | INTRAMUSCULAR | Status: DC | PRN
  Administered 2014-06-13 (×2): 1 mg via INTRAVENOUS
  Filled 2014-06-13 (×2): qty 1

## 2014-06-13 MED ORDER — LORAZEPAM 2 MG/ML IJ SOLN
1.0000 mg | Freq: Once | INTRAMUSCULAR | Status: AC
  Administered 2014-06-13: 1 mg via INTRAVENOUS
  Filled 2014-06-13: qty 1

## 2014-06-13 MED ORDER — HYDROXYZINE HCL 25 MG PO TABS
25.0000 mg | ORAL_TABLET | Freq: Three times a day (TID) | ORAL | Status: DC
  Administered 2014-06-13 – 2014-06-16 (×11): 25 mg via ORAL
  Filled 2014-06-13 (×12): qty 1

## 2014-06-13 MED ORDER — VANCOMYCIN HCL IN DEXTROSE 1-5 GM/200ML-% IV SOLN
1000.0000 mg | Freq: Three times a day (TID) | INTRAVENOUS | Status: DC
  Administered 2014-06-13: 1000 mg via INTRAVENOUS
  Filled 2014-06-13: qty 200

## 2014-06-13 MED ORDER — DEXTROSE 5 % IV SOLN
2.0000 g | Freq: Two times a day (BID) | INTRAVENOUS | Status: DC
  Administered 2014-06-13 – 2014-06-14 (×2): 2 g via INTRAVENOUS
  Filled 2014-06-13 (×3): qty 2

## 2014-06-13 MED ORDER — TRAMADOL HCL 50 MG PO TABS
50.0000 mg | ORAL_TABLET | Freq: Four times a day (QID) | ORAL | Status: DC | PRN
  Filled 2014-06-13: qty 1

## 2014-06-13 MED ORDER — VANCOMYCIN HCL IN DEXTROSE 1-5 GM/200ML-% IV SOLN
1000.0000 mg | Freq: Once | INTRAVENOUS | Status: AC
  Administered 2014-06-13: 1000 mg via INTRAVENOUS
  Filled 2014-06-13: qty 200

## 2014-06-13 MED ORDER — MORPHINE SULFATE 4 MG/ML IJ SOLN
4.0000 mg | INTRAMUSCULAR | Status: DC | PRN
Start: 2014-06-13 — End: 2014-06-16
  Administered 2014-06-13 – 2014-06-16 (×14): 4 mg via INTRAVENOUS
  Filled 2014-06-13 (×14): qty 1

## 2014-06-13 NOTE — Progress Notes (Signed)
ANTIBIOTIC CONSULT NOTE - INITIAL  Pharmacy Consult for Rocephin/Vancomycin/Acyclovir Indication: Diaz/o Meningitis  Allergies  Allergen Reactions  . Latex Hives  . Loratadine     ?  hives    Patient Measurements: Height: 5\' 4"  (162.6 cm) Weight: 220 lb 0.3 oz (99.8 kg) IBW/kg (Calculated) : 54.7   Vital Signs: Temp: 98.5 F (36.9 C) (10/09 0628) Temp Source: Oral (10/09 0628) BP: 120/70 mmHg (10/09 0628) Pulse Rate: 81 (10/09 0628) Intake/Output from previous day:   Intake/Output from this shift:    Labs:  Recent Labs  06/11/14 1225 06/11/14 1327 06/13/14 0050  WBC 13.1*  --  20.1*  HGB 13.4 15.6* 11.7*  PLT 259  --  261  CREATININE  --  0.90 0.74   Estimated Creatinine Clearance: 125.5 ml/min (by C-G formula based on Cr of 0.74). No results found for this basename: VANCOTROUGH, VANCOPEAK, VANCORANDOM, GENTTROUGH, GENTPEAK, GENTRANDOM, TOBRATROUGH, TOBRAPEAK, TOBRARND, AMIKACINPEAK, AMIKACINTROU, AMIKACIN,  in the last 72 hours   Microbiology: Recent Results (from the past 720 hour(s))  CULTURE, GROUP A STREP     Status: None   Collection Time    06/11/14 12:20 PM      Result Value Ref Range Status   Specimen Description THROAT   Final   Special Requests NONE   Final   Culture     Final   Value: NO SUSPICIOUS COLONIES, CONTINUING TO HOLD     Performed at [facility]   Report Status PENDING   Incomplete  GRAM STAIN     Status: None   Collection Time    06/13/14  3:25 AM      Result Value Ref Range Status   Specimen Description CSF   Final   Special Requests NONE   Final   Gram Stain     Final   Value: NO WBC SEEN     NO ORGANISMS SEEN     Gram Stain Report Called to,Read Back By and Verified With: T.NIELSON,RN AT 0500 ON 06/13/14 BY SHEAW   Report Status 06/13/2014 FINAL   Final    Medical History: Past Medical History  Diagnosis Date  . No pertinent past medical history   . Urticaria     Medications:  Scheduled:  . acyclovir  550 mg  Intravenous Q8H  . cefTRIAXone (ROCEPHIN)  IV  2 g Intravenous Q12H  . famotidine  20 mg Oral BID  . hydrOXYzine  25 mg Oral TID  . vancomycin  1,000 mg Intravenous Once  . vancomycin  1,000 mg Intravenous Q8H   Infusions:  . sodium chloride     Assessment: 23 yoF c/o HA/fever.  Empiric Abx for Diaz/o meningitis.   Goal of Therapy:  Vancomycin trough level 15-20 mcg/ml  Plan:   Rocephin 2Gm IV q12h  Vancomycin 1Gm x1 ED then additional 1Gm = 2GM load, then 1GM IV q8h  Acyclovir 550mg  IV q8h  F/u SCr/levels/cultures/LP   Melissa Diaz, Melissa Diaz 06/13/2014,6:50 AM

## 2014-06-13 NOTE — Progress Notes (Signed)
Pt states Ultram doesn't work, it was given to her at Urgent care.

## 2014-06-13 NOTE — Care Management Note (Signed)
CARE MANAGEMENT NOTE 06/13/2014  Patient:  ROSELIN, WIEMANN   Account Number:  [id number]  Date Initiated:  06/13/2014  Documentation initiated by:  Bren Steers  Subjective/Objective Assessment:   23 yo pt admitted with Headache and fever.     Action/Plan:   From home with parent   Anticipated DC Date:  06/17/2014   Anticipated DC Plan:  [place]  CM consult      Choice offered to / List presented to:             Status of service:  In process, will continue to follow Medicare Important Message given?   (If response is "NO", the following Medicare IM given date fields will be blank) Date Medicare IM given:   Medicare IM given by:   Date Additional Medicare IM given:   Additional Medicare IM given by:    Discharge Disposition:    Per UR Regulation:  Reviewed for med. necessity/level of care/duration of stay  If discussed at [place] of Stay Meetings, dates discussed:    Comments:  06/13/14 Met with pt to assess for DC needs.  Pt has primary doctor at [facility] but is unsure of their name.  She states that she wants to return to them for F/U.  No other DC needs noted at this time.  CM will continue to follow.

## 2014-06-13 NOTE — Progress Notes (Addendum)
Progress Note   Melissa SkeansJasmaine K Unterreiner BJY:[id number]:[id number] DOB: 06/11/1991 DOA: 06/12/2014 PCP: PROVIDER NOT IN SYSTEM   Brief Narrative:   Melissa Diaz is an 23 y.o. female with a PMH of chronic recurrent urticaria who was admitted 06/12/14 with chief complaint of fever and headache x5-6 days. LP was done in the ED, Gram stain negative.  Assessment/Plan:   Principal Problem:   Headache and fever, leukocytosis, rule out meningitis  Status post LP with: Leukocyte count: 16, neutrophil predominant, glucose: 60, protein: 50 with negative gram stain, culture pending. Findings are most consistent with a viral etiology.  Enterovirus likely cause (85-95%) but HSV in differential, and HSV PCR pending.    Currently on empiric antibiotics with vancomycin, ceftriaxone and antiviral with acyclovir pending LP studies/cultures.  Treatment is supportive if viral.  Follow up influenza panel. Followup blood cultures. Followup group a strep cultures of pharynx, rapid strep screen negative.  Toradol/ibuprofen given for fevers. Would discontinue this as NSAIDs can be associated with angioedema.  Active problems:    Angioedema with urticaria  Saw an allergist (Dr. Sidney Aceanjan Sharma), but has not followed up.  Case discussed with him.  Continue Pepcid and anti-histamines.    Hypokalemia   Supplement orally.     DVT Prophylaxis   SCDs.  Code Status: Full. Family Communication: Mother updated at the bedside. Disposition Plan: Home when stable.   IV Access:    Peripheral IV   Procedures and diagnostic studies:   LP 06/12/14  Dg Chest 2 View 06/13/2014: No acute cardiopulmonary process seen.     Ct Head Wo Contrast 06/13/2014: Unremarkable noncontrast CT of the head.      Medical Consultants:    None.   Other Consultants:    Dr. Gwen HerKees Van BernardDam, ID.   Anti-Infectives:    Rocephin 06/13/14--->  Vancomycin 06/13/14--->  Acyclovir 06/13/14--->  Subjective:   Melissa SkeansJasmaine K Luna  reports 6/10 headache (frontal), no current nausea, no photophobia, but headache worse with standing up.    Objective:    Filed Vitals:   06/13/14 0230 06/13/14 0420 06/13/14 0504 06/13/14 0628  BP: 122/57 109/47 95/48 120/70  Pulse: 108 100 84 81  Temp:    98.5 F (36.9 C)  TempSrc:    Oral  Resp: 20 18  18   Height:    5\' 4"  (1.626 m)  Weight:    99.8 kg (220 lb 0.3 oz)  SpO2: 99% 98% 99% 100%   No intake or output data in the 24 hours ending 06/13/14 [id number]  Exam: Gen:  NAD Cardiovascular:  RRR, No M/R/G Respiratory:  Lungs CTAB Gastrointestinal:  Abdomen soft, NT/ND, + BS Extremities:  No C/E/C Skin: Angioedema upper lip, hives present   Data Reviewed:    Labs: Basic Metabolic Panel:  Recent Labs Lab 06/11/14 1327 06/13/14 0050  NA 137 135*  K 3.8 3.6*  CL 104 98  CO2  --  21  GLUCOSE 94 90  BUN <3* 3*  CREATININE 0.90 0.74  CALCIUM  --  8.4   GFR Estimated Creatinine Clearance: 125.5 ml/min (by C-G formula based on Cr of 0.74). Liver Function Tests:  Recent Labs Lab 06/13/14 0050  AST 22  ALT 22  ALKPHOS 82  BILITOT 0.6  PROT 7.2  ALBUMIN 2.8*   CBC:  Recent Labs Lab 06/11/14 1225 06/11/14 1327 06/13/14 0050  WBC 13.1*  --  20.1*  NEUTROABS  --   --  17.4*  HGB 13.4 15.6* 11.7*  HCT 39.7 46.0 36.1  MCV 82.9  --  83.6  PLT 259  --  261   Sepsis Labs:  Recent Labs Lab 06/11/14 1225 06/13/14 0050 06/13/14 0113  WBC 13.1* 20.1*  --   LATICACIDVEN  --   --  0.78   Microbiology Recent Results (from the past 240 hour(s))  CULTURE, GROUP A STREP     Status: None   Collection Time    06/11/14 12:20 PM      Result Value Ref Range Status   Specimen Description THROAT   Final   Special Requests NONE   Final   Culture     Final   Value: NO SUSPICIOUS COLONIES, CONTINUING TO HOLD     Performed at [facility]   Report Status PENDING   Incomplete  GRAM STAIN     Status: None   Collection Time    06/13/14  3:25 AM       Result Value Ref Range Status   Specimen Description CSF   Final   Special Requests NONE   Final   Gram Stain     Final   Value: NO WBC SEEN     NO ORGANISMS SEEN     Gram Stain Report Called to,Read Back By and Verified With: T.NIELSON,RN AT 0500 ON 06/13/14 BY SHEAW   Report Status 06/13/2014 FINAL   Final     Medications:   . acyclovir  550 mg Intravenous Q8H  . cefTRIAXone (ROCEPHIN)  IV  2 g Intravenous Q12H  . famotidine  20 mg Oral BID  . hydrOXYzine  25 mg Oral TID  . vancomycin  1,000 mg Intravenous Once  . vancomycin  1,000 mg Intravenous Q8H   Continuous Infusions: . sodium chloride      Time spent: 35 minutes with > 50% of time discussing current diagnostic test results, clinical impression and plan of care.    LOS: 1 day   Shaeleigh Graw  [facility] Pager [phone]. If unable to reach me by pager, please call my cell phone at [phone].  *Please refer to [email or web address], password [email or web address] to get updated schedule on who will round on this patient, as hospitalists switch teams weekly. If 7PM-7AM, please contact night-coverage at [email or web address], password [email or web address] for any overnight needs.  06/13/2014, 8:11 AM

## 2014-06-13 NOTE — ED Notes (Signed)
Unsuccessful lab draw by this writer. RN made aware. 

## 2014-06-13 NOTE — Plan of Care (Signed)
Problem: Problem: Pain Management Progression Goal: PAIN DECREASED WITH MOVEMENT OR ACTIVITY Outcome: Progressing Pain increases when standing

## 2014-06-13 NOTE — Consult Note (Signed)
[facility]    Date of Admission:  06/12/2014  Date of Consult:  06/13/2014  Reason for Consult: Aseptic meningitis Referring Physician: Dr. Rockne Menghini   HPI: Melissa Diaz is an 23 y.o. female history of chronic recurrent urticaria presents to the ER with complaints of headache and fever. Patient has been having these symptoms for last 5-6 days. Patient's symptoms started last week Friday with mild occipital headache and neck pain and over the last few days it worsened and had gone to the urgent care Center on Wednesday evening 2 days ago. Patient was discharged home and was told to take Tylenol for flulike symptoms. Patient came to the ER because of persistent headache and patient also had some nausea. Patient's headache is mostly global but starts from the neck upwards. Has mild photophobia and tearing from her eyes and right eye looks little congested. Denies any sick contacts or recent travel. Denies any drug bites. Denies any weakness of the upper lower extremities. Denies any chest pain short of breath. Lumbar puncture was done in the ER which shows WBC of 16 and predominately neutrophils 59% and Gram stain negative, , protein of 15 and glucose of 60.  Her CSF Jock Lanzer was negative and her fourth generation HIV antibody/antigen test was negative.       Past Medical History  Diagnosis Date  . No pertinent past medical history   . Urticaria     Past Surgical History  Procedure Laterality Date  . No past surgeries    ergies:   Allergies  Allergen Reactions  . Latex Hives  . Loratadine     ?  hives     Medications: I have reviewed patients current medications as documented in Epic Anti-infectives   Start     Dose/Rate Route Frequency Ordered Stop   06/13/14 1800  cefTRIAXone (ROCEPHIN) 2 g in dextrose 5 % 50 mL IVPB     2 g 100 mL/hr over 30 Minutes Intravenous Every 12 hours 06/13/14 0649     06/13/14 1600  vancomycin (VANCOCIN) IVPB 1000  mg/200 mL premix     1,000 mg 200 mL/hr over 60 Minutes Intravenous Every 8 hours 06/13/14 0649     06/13/14 0800  acyclovir (ZOVIRAX) 550 mg in dextrose 5 % 100 mL IVPB     550 mg 111 mL/hr over 60 Minutes Intravenous Every 8 hours 06/13/14 0645     06/13/14 0715  vancomycin (VANCOCIN) IVPB 1000 mg/200 mL premix     1,000 mg 200 mL/hr over 60 Minutes Intravenous  Once 06/13/14 0643 06/13/14 0915   06/13/14 0500  cefTRIAXone (ROCEPHIN) 2 g in dextrose 5 % 50 mL IVPB     2 g 100 mL/hr over 30 Minutes Intravenous  Once 06/13/14 0445 06/13/14 0530   06/13/14 0445  vancomycin (VANCOCIN) IVPB 1000 mg/200 mL premix     1,000 mg 200 mL/hr over 60 Minutes Intravenous  Once 06/13/14 0445 06/13/14 0650      Social History:  reports that she has never smoked. She has never used smokeless tobacco. She reports that she does not drink alcohol or use illicit drugs.  Family History  Problem Relation Age of Onset  . Asthma Brother   . Hypertension Maternal Grandmother   . Arthritis Maternal Grandmother   . Stroke Paternal Grandmother     As in HPI and primary teams notes otherwise 12 point review of systems is negative  Blood pressure 110/51, pulse 103,  temperature 99.1 F (37.3 C), temperature source Oral, resp. rate 18, height '5\' 4"'  (1.626 m), weight 220 lb 0.3 oz (99.8 kg), last menstrual period 05/15/2014, SpO2 100.00%, not currently breastfeeding. General: Alert and awake, oriented x3, not in any acute distress. HEENT: anicteric sclera, pupils reactive to light and accommodation, EOMI, she has swelling of lips CVS regular rate, normal r,  no murmur rubs or gallops Chest: clear to auscultation bilaterally, no wheezing, rales or rhonchi Abdomen: soft nontender, nondistended, normal bowel sounds, Extremities: skin: urticaria Neuro: nonfocal, strength and sensation intact   Results for orders placed during the hospital encounter of 06/12/14 (from the past 48 hour(s))  CBC WITH DIFFERENTIAL      Status: Abnormal   Collection Time    06/13/14 12:50 AM      Result Value Ref Range   WBC 20.1 (*) 4.0 - 10.5 K/uL   RBC 4.32  3.87 - 5.11 MIL/uL   Hemoglobin 11.7 (*) 12.0 - 15.0 g/dL   HCT 36.1  36.0 - 46.0 %   MCV 83.6  78.0 - 100.0 fL   MCH 27.1  26.0 - 34.0 pg   MCHC 32.4  30.0 - 36.0 g/dL   RDW 12.9  11.5 - 15.5 %   Platelets 261  150 - 400 K/uL   Neutrophils Relative % 87 (*) 43 - 77 %   Neutro Abs 17.4 (*) 1.7 - 7.7 K/uL   Lymphocytes Relative 5 (*) 12 - 46 %   Lymphs Abs 1.0  0.7 - 4.0 K/uL   Monocytes Relative 8  3 - 12 %   Monocytes Absolute 1.6 (*) 0.1 - 1.0 K/uL   Eosinophils Relative 0  0 - 5 %   Eosinophils Absolute 0.1  0.0 - 0.7 K/uL   Basophils Relative 0  0 - 1 %   Basophils Absolute 0.0  0.0 - 0.1 K/uL  COMPREHENSIVE METABOLIC PANEL     Status: Abnormal   Collection Time    06/13/14 12:50 AM      Result Value Ref Range   Sodium 135 (*) 137 - 147 mEq/L   Potassium 3.6 (*) 3.7 - 5.3 mEq/L   Chloride 98  96 - 112 mEq/L   CO2 21  19 - 32 mEq/L   Glucose, Bld 90  70 - 99 mg/dL   BUN 3 (*) 6 - 23 mg/dL   Creatinine, Ser 0.74  0.50 - 1.10 mg/dL   Calcium 8.4  8.4 - 10.5 mg/dL   Total Protein 7.2  6.0 - 8.3 g/dL   Albumin 2.8 (*) 3.5 - 5.2 g/dL   AST 22  0 - 37 U/L   Comment: MODERATE HEMOLYSIS     HEMOLYSIS AT THIS LEVEL MAY AFFECT RESULT   ALT 22  0 - 35 U/L   Alkaline Phosphatase 82  39 - 117 U/L   Total Bilirubin 0.6  0.3 - 1.2 mg/dL   GFR calc non Af Amer >90  >90 mL/min   GFR calc Af Amer >90  >90 mL/min   Comment: (NOTE)     The eGFR has been calculated using the CKD EPI equation.     This calculation has not been validated in all clinical situations.     eGFR's persistently <90 mL/min signify possible Chronic Kidney     Disease.   Anion gap 16 (*) 5 - 15  I-STAT CG4 LACTIC ACID, ED     Status: None   Collection Time    06/13/14  1:13 AM      Result Value Ref Range   Lactic Acid, Venous 0.78  0.5 - 2.2 mmol/L  INFLUENZA PANEL BY PCR (TYPE  A & B, H1N1)     Status: None   Collection Time    06/13/14  1:43 AM      Result Value Ref Range   Influenza A By PCR NEGATIVE  NEGATIVE   Influenza B By PCR NEGATIVE  NEGATIVE   H1N1 flu by pcr NOT DETECTED  NOT DETECTED   Comment:            The Xpert Flu assay (FDA approved for     nasal aspirates or washes and     nasopharyngeal swab specimens), is     intended as an aid in the diagnosis of     influenza and should not be used as     a sole basis for treatment.     Performed at [facility]  [place], ED     Status: None   Collection Time    06/13/14  3:09 AM      Result Value Ref Range   Preg Test, Ur NEGATIVE  NEGATIVE   Comment:            THE SENSITIVITY OF THIS     METHODOLOGY IS >24 mIU/mL  CSF CULTURE     Status: None   Collection Time    06/13/14  3:25 AM      Result Value Ref Range   Specimen Description CSF     Special Requests NONE     Gram Stain       Value: NO WBC SEEN     NO ORGANISMS SEEN     Gram Stain Report Called to,Read Back By and Verified With: Gram Stain Report Called to,Read Back By and Verified With: T.NIELSON AT 0500 ON 06/13/14 BY SHEAW Performed by [facility]     Performed at [facility]   Culture PENDING     Report Status PENDING    GRAM STAIN     Status: None   Collection Time    06/13/14  3:25 AM      Result Value Ref Range   Specimen Description CSF     Special Requests NONE     Gram Stain       Value: NO WBC SEEN     NO ORGANISMS SEEN     Gram Stain Report Called to,Read Back By and Verified With: T.NIELSON,RN AT 0500 ON 06/13/14 BY SHEAW   Report Status 06/13/2014 FINAL    PROTEIN AND GLUCOSE, CSF     Status: None   Collection Time    06/13/14  3:25 AM      Result Value Ref Range   Glucose, CSF 60  43 - 76 mg/dL   Total  Protein, CSF 15  15 - 45 mg/dL  CSF CELL COUNT WITH DIFFERENTIAL     Status: Abnormal   Collection Time    06/13/14  3:25 AM      Result Value Ref Range   Tube # 4     Color,  CSF COLORLESS  COLORLESS   Appearance, CSF CLEAR (*) CLEAR   Supernatant NOT INDICATED     RBC Count, CSF 9 (*) 0 /cu mm   WBC, CSF 16 (*) 0 - 5 /cu mm   Comment: CRITICAL RESULT CALLED TO, READ BACK BY AND VERIFIED WITH:     L.ADKINS,RN  AT 0430 ON 06/13/14 BY SHEAW   Segmented Neutrophils-CSF 59 (*) 0 - 6 %   Lymphs, CSF 16 (*) 40 - 80 %   Monocyte-Macrophage-Spinal Fluid 25  15 - 45 %  PATHOLOGIST SMEAR REVIEW     Status: None   Collection Time    06/13/14  3:25 AM      Result Value Ref Range   Path Review Reviewed by Chrystie Nose. Saralyn Pilar, M.D.     Comment: 10.9.15     INCREASED NEUTROPHILS.  CRYPTOCOCCAL ANTIGEN, CSF     Status: None   Collection Time    06/13/14  3:25 AM      Result Value Ref Range   Crypto Ag NEGATIVE  NEGATIVE   Cryptococcal Ag Titer NOT INDICATED  NOT INDICATED   Comment: Performed at [place], ROUTINE W REFLEX MICROSCOPIC     Status: Abnormal   Collection Time    06/13/14  3:31 AM      Result Value Ref Range   Color, Urine YELLOW  YELLOW   APPearance CLOUDY (*) CLEAR   Specific Gravity, Urine 1.020  1.005 - 1.030   pH 6.0  5.0 - 8.0   Glucose, UA NEGATIVE  NEGATIVE mg/dL   Hgb urine dipstick TRACE (*) NEGATIVE   Bilirubin Urine SMALL (*) NEGATIVE   Ketones, ur >80 (*) NEGATIVE mg/dL   Protein, ur NEGATIVE  NEGATIVE mg/dL   Urobilinogen, UA 1.0  0.0 - 1.0 mg/dL   Nitrite NEGATIVE  NEGATIVE   Leukocytes, UA SMALL (*) NEGATIVE  URINE MICROSCOPIC-ADD ON     Status: Abnormal   Collection Time    06/13/14  3:31 AM      Result Value Ref Range   Squamous Epithelial / LPF FEW (*) RARE   WBC, UA 3-6  <3 WBC/hpf   RBC / HPF 3-6  <3 RBC/hpf   Bacteria, UA MANY (*) RARE   Urine-Other MUCOUS PRESENT    HIV ANTIBODY (ROUTINE TESTING)     Status: None   Collection Time    06/13/14  8:20 AM      Result Value Ref Range   HIV 1&2 Ab, 4th Generation NONREACTIVE  NONREACTIVE   Comment: (NOTE)     A NONREACTIVE HIV Ag/Ab result does not  exclude HIV infection since     the time frame for seroconversion is variable. If acute HIV infection     is suspected, a HIV-1 RNA Qualitative TMA test is recommended.     HIV-1/2 Antibody Diff         Not indicated.     HIV-1 RNA, Qual TMA           Not indicated.     PLEASE NOTE: This information has been disclosed to you from records     whose confidentiality may be protected by state law. If your state     requires such protection, then the state law prohibits you from making     any further disclosure of the information without the specific written     consent of the person to whom it pertains, or as otherwise permitted     by law. A general authorization for the release of medical or other     information is NOT sufficient for this purpose.     The performance of this assay has not been clinically validated in     patients less than 64 years old.     Performed at [facility]  COMPREHENSIVE METABOLIC PANEL     Status: Abnormal   Collection Time    06/13/14  8:20 AM      Result Value Ref Range   Sodium 139  137 - 147 mEq/L   Potassium 3.3 (*) 3.7 - 5.3 mEq/L   Chloride 102  96 - 112 mEq/L   CO2 23  19 - 32 mEq/L   Glucose, Bld 90  70 - 99 mg/dL   BUN 3 (*) 6 - 23 mg/dL   Creatinine, Ser 0.75  0.50 - 1.10 mg/dL   Calcium 8.0 (*) 8.4 - 10.5 mg/dL   Total Protein 6.4  6.0 - 8.3 g/dL   Albumin 2.5 (*) 3.5 - 5.2 g/dL   AST 18  0 - 37 U/L   ALT 19  0 - 35 U/L   Alkaline Phosphatase 71  39 - 117 U/L   Total Bilirubin 0.4  0.3 - 1.2 mg/dL   GFR calc non Af Amer >90  >90 mL/min   GFR calc Af Amer >90  >90 mL/min   Comment: (NOTE)     The eGFR has been calculated using the CKD EPI equation.     This calculation has not been validated in all clinical situations.     eGFR's persistently <90 mL/min signify possible Chronic Kidney     Disease.   Anion gap 14  5 - 15  CBC WITH DIFFERENTIAL     Status: Abnormal   Collection Time    06/13/14  8:20 AM      Result Value Ref  Range   WBC 14.2 (*) 4.0 - 10.5 K/uL   RBC 3.89  3.87 - 5.11 MIL/uL   Hemoglobin 10.6 (*) 12.0 - 15.0 g/dL   HCT 32.0 (*) 36.0 - 46.0 %   MCV 82.3  78.0 - 100.0 fL   MCH 27.2  26.0 - 34.0 pg   MCHC 33.1  30.0 - 36.0 g/dL   RDW 13.1  11.5 - 15.5 %   Platelets 257  150 - 400 K/uL   Neutrophils Relative % 82 (*) 43 - 77 %   Neutro Abs 11.6 (*) 1.7 - 7.7 K/uL   Lymphocytes Relative 10 (*) 12 - 46 %   Lymphs Abs 1.5  0.7 - 4.0 K/uL   Monocytes Relative 7  3 - 12 %   Monocytes Absolute 1.0  0.1 - 1.0 K/uL   Eosinophils Relative 1  0 - 5 %   Eosinophils Absolute 0.1  0.0 - 0.7 K/uL   Basophils Relative 0  0 - 1 %   Basophils Absolute 0.0  0.0 - 0.1 K/uL  TSH     Status: None   Collection Time    06/13/14  8:20 AM      Result Value Ref Range   TSH 2.230  0.350 - 4.500 uIU/mL   Comment: Performed at [facility]   '@BRIEFLABTABLE' (sdes,specrequest,cult,reptstatus)   ) Recent Results (from the past 720 hour(s))  CULTURE, GROUP A STREP     Status: None   Collection Time    06/11/14 12:20 PM      Result Value Ref Range Status   Specimen Description THROAT   Final   Special Requests NONE   Final   Culture     Final   Value: No Beta Hemolytic Streptococci Isolated     Performed at [facility]   Report Status 06/13/2014 FINAL   Final  CSF CULTURE  Status: None   Collection Time    06/13/14  3:25 AM      Result Value Ref Range Status   Specimen Description CSF   Final   Special Requests NONE   Final   Gram Stain     Final   Value: NO WBC SEEN     NO ORGANISMS SEEN     Gram Stain Report Called to,Read Back By and Verified With: Gram Stain Report Called to,Read Back By and Verified With: T.NIELSON AT 0500 ON 06/13/14 BY SHEAW Performed by [facility]     Performed at [facility]   Culture PENDING   Incomplete   Report Status PENDING   Incomplete  GRAM STAIN     Status: None   Collection Time    06/13/14  3:25 AM      Result Value Ref Range  Status   Specimen Description CSF   Final   Special Requests NONE   Final   Gram Stain     Final   Value: NO WBC SEEN     NO ORGANISMS SEEN     Gram Stain Report Called to,Read Back By and Verified With: T.NIELSON,RN AT 0500 ON 06/13/14 BY SHEAW   Report Status 06/13/2014 FINAL   Final     Impression/Recommendation  Principal Problem:   Headache Active Problems:   Fever   Leukocytosis   Hypokalemia   Melissa Diaz is a 23 y.o. 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 She underwent lumbar puncture which showed 16 white blood cells with neutrophilic predominance and CSF composition consistent with aseptic meningitis.  #1 aseptic meningitis:  I would get an MRI of the brain to rule out brain abscesses that might be a parameningeal focus of infection and causing abnormal CSF profile.  I would follow up CSF herpes simplex PCR  Would be worthwhile investigating whether or meningitis ever occurs in the context of angioedema in her coronary. I'm not familiar with this.  Other items in the differential could be a chemical meningitis or another viral not meningitis such as enterovirus or West Nile virus the mid picture does not look very compelling for the latter diagnosis.  He'll discontinue the vancomycin  I am okay with acyclovir and ceftriaxone for the time being but if her MRI of the brain is normal would discontinue her ceftriaxone  #2 screening she is negative for HIV   06/13/2014, 4:34 PM   Thank you so much for this interesting consult  Rhea for [place] (pager) [phone] (office) 06/13/2014, 4:34 PM  Rhina [place] 06/13/2014, 4:34 PM

## 2014-06-13 NOTE — Progress Notes (Signed)
Patient still with complaint of severe pain despite Percocet and 1 mg IV morphine.  Dr. Darnelle Catalanama notified and she placed orders.  Allayne ButcherMiller, [facility]  06/13/2014  4:05 PM

## 2014-06-13 NOTE — ED Notes (Signed)
Report called to Gloriajean DellNadine, RN for room 1340.

## 2014-06-13 NOTE — H&P (Addendum)
[facility] History and Physical  Melissa SkeansJasmaine K Bunte JXB:[id number]:[id number] DOB: [birth year] DOA: 06/12/2014  Referring physician: ER physician. PCP: PROVIDER NOT IN SYSTEM   Chief Complaint: Headache.  HPI: Melissa Diaz is a 23 y.o. female history of chronic recurrent urticaria presents to the ER with complaints of headache and fever. Patient has been having these symptoms for last 5-6 days. Patient's symptoms started last week Friday with mild occipital headache and neck pain and over the last few days it worsened and had gone to the urgent care Center on Wednesday evening 2 days ago. Patient was discharged home and was told to take Tylenol for flulike symptoms. Patient came to the ER because of persistent headache and patient also had some nausea. Patient's headache is mostly global but starts from the neck upwards. Has mild photophobia and tearing from her eyes and right eye looks little congested. Denies any sick contacts or recent travel. Denies any drug bites. Denies any weakness of the upper lower extremities. Denies any chest pain short of breath. Lumbar puncture was done in the ER which shows WBC of 16 and predominately neutrophils and Gram stain so far has been negative. Cultures and other studies are pending. Patient also had blood cultures drawn. Patient has been empirically start antibiotics and admitted for further management. CT head is pending. On exam patient is nonfocal.  Review of Systems: As presented in the history of presenting illness, rest negative.  Past Medical History  Diagnosis Date  . No pertinent past medical history   . Urticaria    Past Surgical History  Procedure Laterality Date  . No past surgeries     Social History:  reports that she has never smoked. She has never used smokeless tobacco. She reports that she does not drink alcohol or use illicit drugs. Where does patient live home. Can patient participate in ADLs? Yes.  Allergies  Allergen Reactions   . Latex Hives  . Loratadine     ?  hives    Family History:  Family History  Problem Relation Age of Onset  . Asthma Brother   . Hypertension Maternal Grandmother   . Arthritis Maternal Grandmother   . Stroke Paternal Grandmother       Prior to Admission medications   Medication Sig Start Date End Date Taking? Authorizing Provider  hydrOXYzine (ATARAX/VISTARIL) 25 MG tablet Take 1 tablet (25 mg total) by mouth 3 (three) times daily. 03/02/14  Yes Reuben Likesavid C Keller, MD  ranitidine (ZANTAC) 150 MG tablet Take 1 tablet (150 mg total) by mouth 2 (two) times daily. 03/02/14  Yes Reuben Likesavid C Keller, MD  EPINEPHrine (EPIPEN) 0.3 mg/0.3 mL IJ SOAJ injection Inject 0.3 mLs (0.3 mg total) into the skin once. 03/02/14   Reuben Likesavid C Keller, MD    Physical Exam: Filed Vitals:   06/13/14 0131 06/13/14 0230 06/13/14 0420 06/13/14 0504  BP: 112/45 122/57 109/47 95/48  Pulse: 106 108 100 84  Temp: 101.9 F (38.8 C)     TempSrc: Axillary     Resp:  20 18   SpO2: 97% 99% 98% 99%     General:  Well-built and nourished.  Eyes: Mild congestion in the right eye.  ENT: No discharge from the ears nose or mouth.  Neck: Mild neck rigidity.  Cardiovascular: S1-S2 heard.  Respiratory: No rhonchi or crepitations.  Abdomen: Soft nontender bowel sounds present. No guarding rigidity.  Skin: As of now I don't see any active hives.  Musculoskeletal: No edema or joint  swellings.  Psychiatric: Appears normal.  Neurologic: Alert and oriented to time place and person. Moves all extremities 5 x 5. No facial asymmetry. Tongue is midline.  Labs on Admission:  Basic Metabolic Panel:  Recent Labs Lab 06/11/14 1327 06/13/14 0050  NA 137 135*  K 3.8 3.6*  CL 104 98  CO2  --  21  GLUCOSE 94 90  BUN <3* 3*  CREATININE 0.90 0.74  CALCIUM  --  8.4   Liver Function Tests:  Recent Labs Lab 06/13/14 0050  AST 22  ALT 22  ALKPHOS 82  BILITOT 0.6  PROT 7.2  ALBUMIN 2.8*   No results found for this  basename: LIPASE, AMYLASE,  in the last 168 hours No results found for this basename: AMMONIA,  in the last 168 hours CBC:  Recent Labs Lab 06/11/14 1225 06/11/14 1327 06/13/14 0050  WBC 13.1*  --  20.1*  NEUTROABS  --   --  17.4*  HGB 13.4 15.6* 11.7*  HCT 39.7 46.0 36.1  MCV 82.9  --  83.6  PLT 259  --  261   Cardiac Enzymes: No results found for this basename: CKTOTAL, CKMB, CKMBINDEX, TROPONINI,  in the last 168 hours  BNP (last 3 results) No results found for this basename: PROBNP,  in the last 8760 hours CBG: No results found for this basename: GLUCAP,  in the last 168 hours  Radiological Exams on Admission: Dg Chest 2 View  06/13/2014   CLINICAL DATA:  Acute onset of nausea, generalized body aches, fever and chills. Initial encounter.  EXAM: CHEST  2 VIEW  COMPARISON:  Chest radiograph from 02/09/2012  FINDINGS: The lungs are well-aerated and clear. There is no evidence of focal opacification, pleural effusion or pneumothorax.  The heart is normal in size; the mediastinal contour is within normal limits. No acute osseous abnormalities are seen.  IMPRESSION: No acute cardiopulmonary process seen.   Electronically Signed   By: Roanna RaiderJeffery  Chang M.D.   On: 06/13/2014 04:20     Assessment/Plan Principal Problem:   Headache Active Problems:   Fever   1. Headache with fever concerning for meningitis - follow pending lumbar puncture labs and blood cultures and urine culture. Patient has been empirically placed on vancomycin ceftriaxone and acyclovir for meningitis coverage. CT head is pending. Check HIV status. 2. History of chronic recurrent urticaria - patient is following up with allergy specialist in [place] and over the last few weeks patient was placed on hydroxyzine and Pepcid. Prior to this patient had been on prednisone last May. Closely observe for now. 3. Possible UTI - follow urine cultures. Patient has been placed on empiric antibiotics. 4. Anemia - follow  CBC. 5. Low albumin levels - need further followup. Check HIV status.    Code Status: Full code.  Family Communication: None.  Disposition Plan: Admit to inpatient.    Maudry Zeidan N. [facility] Pager [phone].  If 7PM-7AM, please contact night-coverage [email or web address] Password [email or web address] 06/13/2014, 5:36 AM

## 2014-06-14 DIAGNOSIS — D72829 Elevated white blood cell count, unspecified: Secondary | ICD-10-CM

## 2014-06-14 DIAGNOSIS — R51 Headache: Secondary | ICD-10-CM

## 2014-06-14 DIAGNOSIS — L509 Urticaria, unspecified: Secondary | ICD-10-CM

## 2014-06-14 LAB — URINE CULTURE: Colony Count: >=100000

## 2014-06-14 LAB — BASIC METABOLIC PANEL
Anion gap: 16 — ABNORMAL HIGH (ref 5–15)
BUN: 3 mg/dL — ABNORMAL LOW (ref 6–23)
CO2: 21 mEq/L (ref 19–32)
Calcium: 8.5 mg/dL (ref 8.4–10.5)
Chloride: 99 mEq/L (ref 96–112)
Creatinine, Ser: 0.86 mg/dL (ref 0.50–1.10)
GFR calc Af Amer: >90 mL/min (ref >90)
GFR calc non Af Amer: >90 mL/min (ref >90)
Glucose, Bld: 99 mg/dL (ref 70–99)
Potassium: 3.8 mEq/L (ref 3.7–5.3)
Sodium: 136 mEq/L — ABNORMAL LOW (ref 137–147)

## 2014-06-14 LAB — CBC
HCT: 34.3 % — ABNORMAL LOW (ref 36.0–46.0)
Hemoglobin: 11.1 g/dL — ABNORMAL LOW (ref 12.0–15.0)
MCH: 26.7 pg (ref 26.0–34.0)
MCHC: 32.4 g/dL (ref 30.0–36.0)
MCV: 82.7 fL (ref 78.0–100.0)
Platelets: 340 10*3/uL (ref 150–400)
RBC: 4.15 MIL/uL (ref 3.87–5.11)
RDW: 13.2 % (ref 11.5–15.5)
WBC: 14.9 10*3/uL — ABNORMAL HIGH (ref 4.0–10.5)

## 2014-06-14 MED ORDER — PROMETHAZINE HCL 25 MG/ML IJ SOLN
12.5000 mg | Freq: Four times a day (QID) | INTRAMUSCULAR | Status: DC | PRN
  Administered 2014-06-14: 12.5 mg via INTRAVENOUS
  Filled 2014-06-14: qty 1

## 2014-06-14 MED ORDER — OXYCODONE HCL ER 15 MG PO T12A
15.0000 mg | EXTENDED_RELEASE_TABLET | Freq: Two times a day (BID) | ORAL | Status: DC
  Administered 2014-06-14 – 2014-06-16 (×4): 15 mg via ORAL
  Filled 2014-06-14 (×4): qty 1

## 2014-06-14 NOTE — Progress Notes (Signed)
[facility]    Subjective: No new complaints   Antibiotics:  Anti-infectives   Start     Dose/Rate Route Frequency Ordered Stop   06/13/14 1800  cefTRIAXone (ROCEPHIN) 2 g in dextrose 5 % 50 mL IVPB  Status:  Discontinued     2 g 100 mL/hr over 30 Minutes Intravenous Every 12 hours 06/13/14 0649 06/14/14 1113   06/13/14 1600  vancomycin (VANCOCIN) IVPB 1000 mg/200 mL premix  Status:  Discontinued     1,000 mg 200 mL/hr over 60 Minutes Intravenous Every 8 hours 06/13/14 0649 06/13/14 1636   06/13/14 0800  acyclovir (ZOVIRAX) 550 mg in dextrose 5 % 100 mL IVPB  Status:  Discontinued     550 mg 111 mL/hr over 60 Minutes Intravenous Every 8 hours 06/13/14 0645 06/14/14 0813   06/13/14 0715  vancomycin (VANCOCIN) IVPB 1000 mg/200 mL premix     1,000 mg 200 mL/hr over 60 Minutes Intravenous  Once 06/13/14 0643 06/13/14 0915   06/13/14 0500  cefTRIAXone (ROCEPHIN) 2 g in dextrose 5 % 50 mL IVPB     2 g 100 mL/hr over 30 Minutes Intravenous  Once 06/13/14 0445 06/13/14 0530   06/13/14 0445  vancomycin (VANCOCIN) IVPB 1000 mg/200 mL premix     1,000 mg 200 mL/hr over 60 Minutes Intravenous  Once 06/13/14 0445 06/13/14 0650      Medications: Scheduled Meds: . famotidine  20 mg Oral BID  . hydrOXYzine  25 mg Oral TID  . OxyCODONE  15 mg Oral Q12H   Continuous Infusions:  PRN Meds:.acetaminophen, acetaminophen, morphine injection, oxyCODONE-acetaminophen, promethazine    Objective: Weight change:   Intake/Output Summary (Last 24 hours) at 06/14/14 1559 Last data filed at 06/14/14 1230  Gross per 24 hour  Intake 3412.25 ml  Output   3200 ml  Net 212.25 ml   Blood pressure 104/56, pulse 87, temperature 99.6 F (37.6 C), temperature source Oral, resp. rate 18, height 5\' 4"  (1.626 m), weight 220 lb 0.3 oz (99.8 kg), last menstrual period 05/15/2014, SpO2 98.00%, not currently breastfeeding. Temp:  [98.7 F (37.1 C)-101.8 F (38.8 C)] 99.6 F  (37.6 C) (10/10 1340) Pulse Rate:  [87-112] 87 (10/10 1340) Resp:  [16-18] 18 (10/10 1340) BP: (99-104)/(45-56) 104/56 mmHg (10/10 1340) SpO2:  [96 %-100 %] 98 % (10/10 1340)  Physical Exam: General: Alert and awake, oriented x3, not in any acute distress.  HEENT: anicteric sclera, pupils reactive to light and accommodation, EOMI, she has swelling of lips  CVS regular rate, normal r, no murmur rubs or gallops  Chest: clear to auscultation bilaterally, no wheezing, rales or rhonchi  Abdomen: soft nontender, nondistended, normal bowel sounds,  Extremities: skin: urticaria  Neuro: nonfocal, strength and sensation intact  CBC:  Recent Labs Lab 06/11/14 1225 06/11/14 1327 06/13/14 0050 06/13/14 0820 06/14/14 0413  HGB 13.4 15.6* 11.7* 10.6* 11.1*  HCT 39.7 46.0 36.1 32.0* 34.3*  PLT 259  --  261 257 340     BMET  Recent Labs  06/13/14 0820 06/14/14 0413  NA 139 136*  K 3.3* 3.8  CL 102 99  CO2 23 21  GLUCOSE 90 99  BUN 3* 3*  CREATININE 0.75 0.86  CALCIUM 8.0* 8.5     Liver Panel   Recent Labs  06/13/14 0050 06/13/14 0820  PROT 7.2 6.4  ALBUMIN 2.8* 2.5*  AST 22 18  ALT 22 19  ALKPHOS 82 71  BILITOT 0.6 0.4  Sedimentation Rate No results found for this basename: ESRSEDRATE,  in the last 72 hours C-Reactive Protein No results found for this basename: CRP,  in the last 72 hours  Micro Results: Recent Results (from the past 720 hour(s))  CULTURE, GROUP A STREP     Status: None   Collection Time    06/11/14 12:20 PM      Result Value Ref Range Status   Specimen Description THROAT   Final   Special Requests NONE   Final   Culture     Final   Value: No Beta Hemolytic Streptococci Isolated     Performed at [facility]   Report Status 06/13/2014 FINAL   Final  CULTURE, BLOOD (ROUTINE X 2)     Status: None   Collection Time    06/13/14  2:06 AM      Result Value Ref Range Status   Specimen Description BLOOD RIGHT HAND   Final    Special Requests BOTTLES DRAWN AEROBIC AND ANAEROBIC 4 CC EACH   Final   Culture  Setup Time     Final   Value: 06/13/2014 08:53     Performed at [facility]   Culture     Final   Value:        BLOOD CULTURE RECEIVED NO GROWTH TO DATE CULTURE WILL BE HELD FOR 5 DAYS BEFORE ISSUING A FINAL NEGATIVE REPORT     Performed at [facility]   Report Status PENDING   Incomplete  CULTURE, BLOOD (ROUTINE X 2)     Status: None   Collection Time    06/13/14  2:06 AM      Result Value Ref Range Status   Specimen Description BLOOD LEFT HAND   Final   Special Requests BOTTLES DRAWN AEROBIC AND ANAEROBIC 3CC EACHA   Final   Culture  Setup Time     Final   Value: 06/13/2014 08:53     Performed at [facility]   Culture     Final   Value:        BLOOD CULTURE RECEIVED NO GROWTH TO DATE CULTURE WILL BE HELD FOR 5 DAYS BEFORE ISSUING A FINAL NEGATIVE REPORT     Performed at [facility]   Report Status PENDING   Incomplete  CSF CULTURE     Status: None   Collection Time    06/13/14  3:25 AM      Result Value Ref Range Status   Specimen Description CSF   Final   Special Requests NONE   Final   Gram Stain     Final   Value: NO WBC SEEN     NO ORGANISMS SEEN     Gram Stain Report Called to,Read Back By and Verified With: Gram Stain Report Called to,Read Back By and Verified With: T.NIELSON AT 0500 ON 06/13/14 BY SHEAW [facility]formed by [facility]     Performed at [facility]   Culture     Final   Value: NO GROWTH 1 DAY     Performed at [facility]   Report Status PENDING   Incomplete  GRAM STAIN     Status: None   Collection Time    06/13/14  3:25 AM      Result Value Ref Range Status   Specimen Description CSF   Final   Special Requests NONE   Final   Gram Stain     Final   Value:  NO WBC SEEN     NO ORGANISMS SEEN     Gram Stain Report Called to,Read Back By and Verified With: T.NIELSON,RN AT 0500 ON 06/13/14 BY SHEAW   Report Status  06/13/2014 FINAL   Final  URINE CULTURE     Status: None   Collection Time    06/13/14  3:31 AM      Result Value Ref Range Status   Specimen Description URINE, CLEAN CATCH   Final   Special Requests NONE   Final   Culture  Setup Time     Final   Value: 06/13/2014 08:54     Performed at [facility] Count     Final   Value: >=100,000 COLONIES/ML     Performed at [facility]   Culture     Final   Value: Multiple bacterial morphotypes present, none predominant. Suggest appropriate recollection if clinically indicated.     Performed at [facility]   Report Status 06/14/2014 FINAL   Final    Studies/Results: Dg Chest 2 View  06/13/2014   CLINICAL DATA:  Acute onset of nausea, generalized body aches, fever and chills. Initial encounter.  EXAM: CHEST  2 VIEW  COMPARISON:  Chest radiograph from 02/09/2012  FINDINGS: The lungs are well-aerated and clear. There is no evidence of focal opacification, pleural effusion or pneumothorax.  The heart is normal in size; the mediastinal contour is within normal limits. No acute osseous abnormalities are seen.  IMPRESSION: No acute cardiopulmonary process seen.   Electronically Signed   By: Roanna Raider M.D.   On: 06/13/2014 04:20   Ct Head Wo Contrast  06/13/2014   CLINICAL DATA:  Severe acute headache and generalized body aches. Headache worsened following lumbar puncture earlier this morning. Initial encounter.  EXAM: CT HEAD WITHOUT CONTRAST  TECHNIQUE: Contiguous axial images were obtained from the base of the skull through the vertex without intravenous contrast.  COMPARISON:  None.  FINDINGS: There is no evidence of acute infarction, mass lesion, or intra- or extra-axial hemorrhage on CT.  The posterior fossa, including the cerebellum, brainstem and fourth ventricle, is within normal limits. The third and lateral ventricles, and basal ganglia are unremarkable in appearance. The cerebral hemispheres are symmetric in  appearance, with normal gray-white differentiation. No mass effect or midline shift is seen.  There is no evidence of fracture; visualized osseous structures are unremarkable in appearance. The orbits are within normal limits. The paranasal sinuses and mastoid air cells are well-aerated. No significant soft tissue abnormalities are seen.  IMPRESSION: Unremarkable noncontrast CT of the head.   Electronically Signed   By: Roanna Raider M.D.   On: 06/13/2014 06:26   [name] RU Contrast  06/13/2014   CLINICAL DATA:  23 year old female with severe acute headache an generalized body aches. Progressed headache following lumbar puncture earlier today. Initial encounter. CSF with elevated white blood cells, predominantly neutrophils.  EXAM: MRI HEAD WITHOUT AND WITH CONTRAST  TECHNIQUE: Multiplanar, multiecho pulse sequences of the brain and surrounding structures were obtained without and with intravenous contrast.  CONTRAST:  20mL MULTIHANCE GADOBENATE DIMEGLUMINE 529 MG/ML IV SOLN  COMPARISON:  Non contrast head CT 0544 hr the same day.  FINDINGS: Abnormal 7 mm round focus of restricted diffusion in the central splenium of the corpus callosum, associated with mild T2 and FLAIR hyperintensity. See series 4, image 20, series 6, image 14. Associated mildly decreased T1 signal, with no associated enhancement (series 11,  image 30). No associated T2* signal loss to suggest hemorrhage or mineralization.  No other abnormal diffusion. There are multiple small subcortical foci of cerebral white matter T2 and FLAIR hyperintensity, nonspecific. There is minimal T2 and FLAIR hyperintensity in the pons. Deep gray matter nuclei are within normal limits. Cerebellum within normal limits. Following contrast, no abnormal enhancement identified.  Cerebral volume is normal. No ventriculomegaly. No extra-axial collection. Negative pituitary, cervicomedullary junction and visualized cervical spine. Major intracranial vascular flow voids  are preserved.  Visible internal auditory structures appear normal. Visualized paranasal sinuses and mastoids are clear. Visualized orbit soft tissues are within normal limits. Visualized scalp soft tissues are within normal limits. Negative visualized cervical spine.  IMPRESSION: 1. Subcentimeter area of diffusion restriction and other signal abnormality in the central splenium of the corpus callosum. No associated enhancement. This is nonspecific and can be seen in the setting of renal failure, hypertension, metabolic disturbances (including hypoglycemia, hypo/hypernatremia), ischemia, seizure, infection, demyelination, radiation and chemotherapy. 2. No other acute findings in the brain. Mild but age advanced nonspecific subcortical white matter signal changes.   Electronically Signed   By: Augusto GambleLee  Hall M.D.   On: 06/13/2014 18:38      Assessment/Plan:  Principal Problem:   Headache Active Problems:   Fever   Leukocytosis   Hypokalemia    Melissa Diaz is a 23 y.o. 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 She underwent lumbar puncture which showed 16 white blood cells with neutrophilic predominance and CSF composition consistent with aseptic meningitis. MRI brain does not show any brain abscesses or parameningeal foci of infection  #1 aseptic meningitis: Likely due to virus. Not a pyogenic infection. HSV PCR negative --Discontinue all antibiotics  --Treat symptomatically  I will sign off for now  Please call with further questions.       LOS: 2 days   Acey LavCornelius Van [place] 06/14/2014, 3:59 PM

## 2014-06-14 NOTE — Progress Notes (Signed)
Progress Note   Melissa SkeansJasmaine K Tulloch YNW:[id number] DOB: Feb 23, 1991 DOA: 06/12/2014 PCP: PROVIDER NOT IN SYSTEM   Brief Narrative:   Melissa Diaz is an [age] y.o. female with a PMH of chronic recurrent urticaria who was admitted 06/12/14 with chief complaint of fever and headache x5-6 days. LP was done in the ED, Gram stain negative.  Assessment/Plan:   Principal Problem:   Headache and fever, leukocytosis, aseptic meningitis  Status post LP with: Leukocyte count: 16, neutrophil predominant, glucose: 60, protein: 50 with negative gram stain, culture pending. Findings are most consistent with a viral etiology.  Enterovirus likely cause (85-95%), HSV PCR negative.  Discontinue empiric acyclovir and Rocephin.  MRI of the brain done, no evidence of brain abscess.  Case discussed with Dr. Thad Rangereynolds who reviewed MRI abnormalities and do not think they are related to current issue, recommends outpatient follow up.  Currently on empiric antibiotics with ceftriaxone pending LP studies/cultures, although bacterial meningitis unlikely. Vancomycin discontinued by ID 06/13/14.  Treatment is supportive.  Influenza panel negative. Group A strep culture negative. Followup blood cultures.   Toradol/ibuprofen given for fevers on admission. Discontinued because NSAIDs can be associated with angioedema.  Still spiking fevers greater than 101F.  With ongoing headache, will place on OxyContin for better (more consistent) pain management.  Active problems:    Angioedema with urticaria  Saw an allergist (Dr. Sidney Aceanjan Sharma), but has not followed up.  Case discussed with him.  Continue Pepcid and anti-histamines.  Upper lip swelling improved today.    Hypokalemia   Resolved with supplementation.     DVT Prophylaxis   SCDs.  Code Status: Full. Family Communication: Mother updated at the bedside 06/13/14. Disposition Plan: Home when stable.   IV Access:    Peripheral IV   Procedures  and diagnostic studies:   LP 06/12/14  Dg Chest 2 View 06/13/2014: No acute cardiopulmonary process seen.     Ct Head Wo Contrast 06/13/2014: Unremarkable noncontrast CT of the head.     Mr Laqueta JeanBrain W Wo Contrast 06/13/2014: 1. Subcentimeter area of diffusion restriction and other signal abnormality in the central splenium of the corpus callosum. No associated enhancement. This is nonspecific and can be seen in the setting of renal failure, hypertension, metabolic disturbances (including hypoglycemia, hypo/hypernatremia), ischemia, seizure, infection, demyelination, radiation and chemotherapy. 2. No other acute findings in the brain. Mild but age advanced nonspecific subcortical white matter signal changes.     Medical Consultants:    None.   Other Consultants:    [place].   Anti-Infectives:    Rocephin 06/13/14---> 06/14/14  Vancomycin 06/13/14---> 06/13/14  Acyclovir 06/13/14---> 06/14/14  Subjective:   Melissa Diaz reports 8/10 headache (frontal), no current nausea, no photophobia, but headache worse with standing up.  Appetite poor.  Swelling on upper lip slightly improved.  Objective:    Filed Vitals:   06/13/14 1306 06/13/14 2150 06/14/14 0559 06/14/14 0658  BP: 110/51 99/56 102/45   Pulse: 103 91 112   Temp: 99.1 F (37.3 C) 98.7 F (37.1 C) 101.8 F (38.8 C) 100.8 F (38.2 C)  TempSrc: Oral Oral Axillary Axillary  Resp: 18 16 16    Height:      Weight:      SpO2: 100% 100% 96%     Intake/Output Summary (Last 24 hours) at 06/14/14 0810 Last data filed at 06/14/14 0600  Gross per 24 hour  Intake   1771 ml  Output   1500  ml  Net    271 ml    Exam: Gen:  NAD Cardiovascular:  RRR, No M/R/G Respiratory:  Lungs CTAB Gastrointestinal:  Abdomen soft, NT/ND, + BS Extremities:  No C/E/C Skin: Angioedema upper lip, improved with near resolution of hives   Data Reviewed:    Labs: Basic Metabolic Panel:  Recent Labs Lab 06/11/14 1327  06/13/14 0050 06/13/14 0820 06/14/14 0413  NA 137 135* 139 136*  K 3.8 3.6* 3.3* 3.8  CL 104 98 102 99  CO2  --  21 23 21   GLUCOSE 94 90 90 99  BUN <3* 3* 3* 3*  CREATININE 0.90 0.74 0.75 0.86  CALCIUM  --  8.4 8.0* 8.5   GFR Estimated Creatinine Clearance: 116.8 ml/min (by C-G formula based on Cr of 0.86). Liver Function Tests:  Recent Labs Lab 06/13/14 0050 06/13/14 0820  AST 22 18  ALT 22 19  ALKPHOS 82 71  BILITOT 0.6 0.4  PROT 7.2 6.4  ALBUMIN 2.8* 2.5*   CBC:  Recent Labs Lab 06/11/14 1225 06/11/14 1327 06/13/14 0050 06/13/14 0820 06/14/14 0413  WBC 13.1*  --  20.1* 14.2* 14.9*  NEUTROABS  --   --  17.4* 11.6*  --   HGB 13.4 15.6* 11.7* 10.6* 11.1*  HCT 39.7 46.0 36.1 32.0* 34.3*  MCV 82.9  --  83.6 82.3 82.7  PLT 259  --  261 257 340   Sepsis Labs:  Recent Labs Lab 06/11/14 1225 06/13/14 0050 06/13/14 0113 06/13/14 0820 06/14/14 0413  WBC 13.1* 20.1*  --  14.2* 14.9*  LATICACIDVEN  --   --  0.78  --   --    Microbiology Recent Results (from the past 240 hour(s))  CULTURE, GROUP A STREP     Status: None   Collection Time    06/11/14 12:20 PM      Result Value Ref Range Status   Specimen Description THROAT   Final   Special Requests NONE   Final   Culture     Final   Value: No Beta Hemolytic Streptococci Isolated     Performed at [facility]   Report Status 06/13/2014 FINAL   Final  CSF CULTURE     Status: None   Collection Time    06/13/14  3:25 AM      Result Value Ref Range Status   Specimen Description CSF   Final   Special Requests NONE   Final   Gram Stain     Final   Value: NO WBC SEEN     NO ORGANISMS SEEN     Gram Stain Report Called to,Read Back By and Verified With: Gram Stain Report Called to,Read Back By and Verified With: T.NIELSON AT 0500 ON 06/13/14 BY SHEAW Performed by [facility]     Performed at [facility]   Culture     Final   Value: NO GROWTH 1 DAY     Performed at [facility]   Report Status PENDING   Incomplete  GRAM STAIN     Status: None   Collection Time    06/13/14  3:25 AM      Result Value Ref Range Status   Specimen Description CSF   Final   Special Requests NONE   Final   Gram Stain     Final   Value: NO WBC SEEN     NO ORGANISMS SEEN     Gram Stain Report Called to,Read Back  By and Verified With: T.NIELSON,RN AT 0500 ON 06/13/14 BY SHEAW   Report Status 06/13/2014 FINAL   Final     Medications:   . acyclovir  550 mg Intravenous Q8H  . cefTRIAXone (ROCEPHIN)  IV  2 g Intravenous Q12H  . famotidine  20 mg Oral BID  . hydrOXYzine  25 mg Oral TID   Continuous Infusions:    Time spent: 35 minutes with > 50% of time discussing current diagnostic test results, clinical impression and plan of care with the patient's mother.    LOS: 2 days   Michaelpaul Apo  [facility] Pager [phone]. If unable to reach me by pager, please call my cell phone at [phone].  *Please refer to [email or web address], password [email or web address] to get updated schedule on who will round on this patient, as hospitalists switch teams weekly. If 7PM-7AM, please contact night-coverage at [email or web address], password [email or web address] for any overnight needs.  06/14/2014, 8:10 AM

## 2014-06-14 NOTE — Plan of Care (Signed)
Problem: Problem: Pain Management Progression Goal: Pain controlled Outcome: Progressing Pain decreases with prns but returns "once medication wears off" per patient. Headaches that radiate to neck "if severe" and pain increases with moving. Photophobia so keeping lights low. Heat compress to head "helpful" per patient. So did get K pad for patient to use on neck if needed.

## 2014-06-15 DIAGNOSIS — J029 Acute pharyngitis, unspecified: Secondary | ICD-10-CM | POA: Diagnosis present

## 2014-06-15 DIAGNOSIS — K5900 Constipation, unspecified: Secondary | ICD-10-CM | POA: Diagnosis present

## 2014-06-15 LAB — MONONUCLEOSIS SCREEN: Mono Screen: NEGATIVE

## 2014-06-15 MED ORDER — MENTHOL 3 MG MT LOZG
1.0000 | LOZENGE | OROMUCOSAL | Status: DC | PRN
  Administered 2014-06-15: 3 mg via ORAL
  Filled 2014-06-15: qty 9

## 2014-06-15 MED ORDER — POLYETHYLENE GLYCOL 3350 17 G PO PACK
17.0000 g | PACK | Freq: Every day | ORAL | Status: DC
  Administered 2014-06-15 – 2014-06-16 (×2): 17 g via ORAL
  Filled 2014-06-15 (×2): qty 1

## 2014-06-15 NOTE — Progress Notes (Signed)
Progress Note   Melissa SkeansJasmaine K Hartl ZOX:[id number] DOB: [birth year] DOA: 06/12/2014 PCP: PROVIDER NOT IN SYSTEM   Brief Narrative:   Melissa Diaz is an 23 y.o. female with a PMH of chronic recurrent urticaria who was admitted 06/12/14 with chief complaint of fever and headache x5-6 days. LP was done in the ED, Gram stain negative.  Assessment/Plan:   Principal Problem:   Headache and fever, leukocytosis, aseptic meningitis  Status post LP with: Leukocyte count: 16, neutrophil predominant, glucose: 60, protein: 50 with negative gram stain, culture pending. Findings are most consistent with a viral etiology.  Enterovirus likely cause (85-95%), HSV PCR negative.  Discontinue empiric acyclovir and Rocephin.  MRI of the brain done, no evidence of brain abscess.  Case discussed with Dr. Thad Rangereynolds who reviewed MRI abnormalities and do not think they are related to current issue, recommends outpatient follow up.  Currently on empiric antibiotics with ceftriaxone pending LP studies/cultures, although bacterial meningitis unlikely. Vancomycin discontinued by ID 06/13/14.  Treatment is supportive.  Influenza panel negative. Group A strep culture negative. Followup blood cultures.   Toradol/ibuprofen given for fevers on admission. Discontinued because NSAIDs can be associated with angioedema.  Fevers coming down.  With ongoing headache, will place on OxyContin for better (more consistent) pain management.  Active problems:    Pharyngitis  Throat culture negative.  Cepacol lozenges.  Check Monospot.    Constipation  Start MiraLAX    Angioedema with urticaria  Saw an allergist (Dr. Sidney Aceanjan Sharma), but has not followed up.  Case discussed with him.  Continue Pepcid and anti-histamines.  Upper lip swelling improved today.    Hypokalemia   Resolved with supplementation.     DVT Prophylaxis   SCDs.  Code Status: Full. Family Communication: Mother updated at the bedside  06/13/14. Disposition Plan: Home when stable.   IV Access:    Peripheral IV   Procedures and diagnostic studies:   LP 06/12/14  Dg Chest 2 View 06/13/2014: No acute cardiopulmonary process seen.     Ct Head Wo Contrast 06/13/2014: Unremarkable noncontrast CT of the head.     Melissa Diaz Wo Contrast 06/13/2014: 1. Subcentimeter area of diffusion restriction and other signal abnormality in the central splenium of the corpus callosum. No associated enhancement. This is nonspecific and can be seen in the setting of renal failure, hypertension, metabolic disturbances (including hypoglycemia, hypo/hypernatremia), ischemia, seizure, infection, demyelination, radiation and chemotherapy. 2. No other acute findings in the brain. Mild but age advanced nonspecific subcortical white matter signal changes.     Medical Consultants:    Dr. Gwen HerKees [place], [place].   Other Consultants:    None.   Anti-Infectives:    Rocephin 06/13/14---> 06/14/14  Vancomycin 06/13/14---> 06/13/14  Acyclovir 06/13/14---> 06/14/14  Subjective:   Melissa Diaz reports a throbbing headache at 8/10 and pain meds reduce it to about a 6.  No nausea or vomiting.  Appetite still not very good.  LBM was 5 days ago.    Objective:    Filed Vitals:   06/14/14 0658 06/14/14 1340 06/14/14 2059 06/15/14 0558  BP:  104/56 106/46 101/59  Pulse:  87 89 88  Temp: 100.8 F (38.2 C) 99.6 F (37.6 C) 98.4 F (36.9 C) 99.2 F (37.3 C)  TempSrc: Axillary Oral Axillary Axillary  Resp:  18 16 16   Height:      Weight:      SpO2:  98% 95% 100%    Intake/Output Summary (Last  24 hours) at 06/15/14 1154 Last data filed at 06/15/14 0958  Gross per 24 hour  Intake   1020 ml  Output   1175 ml  Net   -155 ml    Exam: Gen:  NAD Cardiovascular:  RRR, No M/R/G Respiratory:  Lungs CTAB Gastrointestinal:  Abdomen soft, NT/ND, + BS Extremities:  No C/E/C Skin: Angioedema and hives resolved.   Data Reviewed:     Labs: Basic Metabolic Panel:  Recent Labs Lab 06/11/14 1327 06/13/14 0050 06/13/14 0820 06/14/14 0413  NA 137 135* 139 136*  K 3.8 3.6* 3.3* 3.8  CL 104 98 102 99  CO2  --  21 23 21   GLUCOSE 94 90 90 99  BUN <3* 3* 3* 3*  CREATININE 0.90 0.74 0.75 0.86  CALCIUM  --  8.4 8.0* 8.5   GFR Estimated Creatinine Clearance: 116.8 ml/min (by C-G formula based on Cr of 0.86). Liver Function Tests:  Recent Labs Lab 06/13/14 0050 06/13/14 0820  AST 22 18  ALT 22 19  ALKPHOS 82 71  BILITOT 0.6 0.4  PROT 7.2 6.4  ALBUMIN 2.8* 2.5*   CBC:  Recent Labs Lab 06/11/14 1225 06/11/14 1327 06/13/14 0050 06/13/14 0820 06/14/14 0413  WBC 13.1*  --  20.1* 14.2* 14.9*  NEUTROABS  --   --  17.4* 11.6*  --   HGB 13.4 15.6* 11.7* 10.6* 11.1*  HCT 39.7 46.0 36.1 32.0* 34.3*  MCV 82.9  --  83.6 82.3 82.7  PLT 259  --  261 257 340   Sepsis Labs:  Recent Labs Lab 06/11/14 1225 06/13/14 0050 06/13/14 0113 06/13/14 0820 06/14/14 0413  WBC 13.1* 20.1*  --  14.2* 14.9*  LATICACIDVEN  --   --  0.78  --   --    Microbiology Recent Results (from the past 240 hour(s))  CULTURE, GROUP A STREP     Status: None   Collection Time    06/11/14 12:20 PM      Result Value Ref Range Status   Specimen Description THROAT   Final   Special Requests NONE   Final   Culture     Final   Value: No Beta Hemolytic Streptococci Isolated     [facility]   Report Status 06/13/2014 FINAL   Final  CULTURE, BLOOD (ROUTINE X 2)     Status: None   Collection Time    06/13/14  2:06 AM      Result Value Ref Range Status   Specimen Description BLOOD RIGHT HAND   Final   Special Requests BOTTLES DRAWN AEROBIC AND ANAEROBIC 4 CC EACH   Final   Culture  Setup Time     Final   Value: 06/13/2014 08:53     Performed at [facility]   Culture     Final   Value:        BLOOD CULTURE RECEIVED NO GROWTH TO DATE CULTURE WILL BE HELD FOR 5 DAYS BEFORE ISSUING A FINAL NEGATIVE  REPORT     Performed at [facility]   Report Status PENDING   Incomplete  CULTURE, BLOOD (ROUTINE X 2)     Status: None   Collection Time    06/13/14  2:06 AM      Result Value Ref Range Status   Specimen Description BLOOD LEFT HAND   Final   Special Requests BOTTLES DRAWN AEROBIC AND ANAEROBIC 3CC EACHA   Final   Culture  Setup Time  Final   Value: 06/13/2014 08:53     Performed at [facility]   Culture     Final   Value:        BLOOD CULTURE RECEIVED NO GROWTH TO DATE CULTURE WILL BE HELD FOR 5 DAYS BEFORE ISSUING A FINAL NEGATIVE REPORT     Performed at [facility]   Report Status PENDING   Incomplete  CSF CULTURE     Status: None   Collection Time    06/13/14  3:25 AM      Result Value Ref Range Status   Specimen Description CSF   Final   Special Requests NONE   Final   Gram Stain     Final   Value: NO WBC SEEN     NO ORGANISMS SEEN     Gram Stain Report Called to,Read Back By and Verified With: Gram Stain Report Called to,Read Back By and Verified With: T.NIELSON AT 0500 ON 06/13/14 BY SHEAW Performed by [facility]     Performed at [facility]   Culture     Final   Value: NO GROWTH 1 DAY     Performed at [facility]   Report Status PENDING   Incomplete  GRAM STAIN     Status: None   Collection Time    06/13/14  3:25 AM      Result Value Ref Range Status   Specimen Description CSF   Final   Special Requests NONE   Final   Gram Stain     Final   Value: NO WBC SEEN     NO ORGANISMS SEEN     Gram Stain Report Called to,Read Back By and Verified With: T.NIELSON,RN AT 0500 ON 06/13/14 BY SHEAW   Report Status 06/13/2014 FINAL   Final  URINE CULTURE     Status: None   Collection Time    06/13/14  3:31 AM      Result Value Ref Range Status   Specimen Description URINE, CLEAN CATCH   Final   Special Requests NONE   Final   Culture  Setup Time     Final   Value: 06/13/2014 08:54     Performed at [facility]     Final   Value: >=100,000 COLONIES/ML     Performed at [facility]   Culture     Final   Value: Multiple bacterial morphotypes present, none predominant. Suggest appropriate recollection if clinically indicated.     Performed at [facility]   Report Status 06/14/2014 FINAL   Final     Medications:   . famotidine  20 mg Oral BID  . hydrOXYzine  25 mg Oral TID  . OxyCODONE  15 mg Oral Q12H   Continuous Infusions:    Time spent: 25 minutes.    LOS: 3 days   Anniebell Bedore  [facility] Pager [phone]. If unable to reach me by pager, please call my cell phone at [phone].  *Please refer to [email or web address], password [email or web address] to get updated schedule on who will round on this patient, as hospitalists switch teams weekly. If 7PM-7AM, please contact night-coverage at [email or web address], password [email or web address] for any overnight needs.  06/15/2014, 11:54 AM

## 2014-06-16 ENCOUNTER — Encounter (HOSPITAL_COMMUNITY): Payer: Self-pay | Admitting: Anesthesiology

## 2014-06-16 DIAGNOSIS — A879 Viral meningitis, unspecified: Secondary | ICD-10-CM | POA: Diagnosis present

## 2014-06-16 DIAGNOSIS — J029 Acute pharyngitis, unspecified: Secondary | ICD-10-CM

## 2014-06-16 LAB — CBC
HCT: 30.3 % — ABNORMAL LOW (ref 36.0–46.0)
Hemoglobin: 9.8 g/dL — ABNORMAL LOW (ref 12.0–15.0)
MCH: 26.8 pg (ref 26.0–34.0)
MCHC: 32.3 g/dL (ref 30.0–36.0)
MCV: 83 fL (ref 78.0–100.0)
Platelets: 390 10*3/uL (ref 150–400)
RBC: 3.65 MIL/uL — ABNORMAL LOW (ref 3.87–5.11)
RDW: 13.3 % (ref 11.5–15.5)
WBC: 11.7 10*3/uL — ABNORMAL HIGH (ref 4.0–10.5)

## 2014-06-16 LAB — BASIC METABOLIC PANEL
Anion gap: 13 (ref 5–15)
BUN: 4 mg/dL — ABNORMAL LOW (ref 6–23)
CO2: 26 mEq/L (ref 19–32)
Calcium: 8.5 mg/dL (ref 8.4–10.5)
Chloride: 99 mEq/L (ref 96–112)
Creatinine, Ser: 0.79 mg/dL (ref 0.50–1.10)
GFR calc Af Amer: >90 mL/min (ref >90)
GFR calc non Af Amer: >90 mL/min (ref >90)
Glucose, Bld: 91 mg/dL (ref 70–99)
Potassium: 3.8 mEq/L (ref 3.7–5.3)
Sodium: 138 mEq/L (ref 137–147)

## 2014-06-16 LAB — CSF CULTURE W GRAM STAIN
Culture: NO GROWTH
Gram Stain: NONE SEEN

## 2014-06-16 LAB — GRAM STAIN: Gram Stain: NONE SEEN

## 2014-06-16 MED ORDER — ACETAMINOPHEN 325 MG PO TABS: 650.0000 mg | ORAL_TABLET | ORAL | Status: DC | PRN

## 2014-06-16 MED ORDER — POLYETHYLENE GLYCOL 3350 17 G PO PACK: 17.0000 g | PACK | Freq: Every day | ORAL | Status: DC

## 2014-06-16 MED ORDER — OXYCODONE-ACETAMINOPHEN 5-325 MG PO TABS: 1.0000 | ORAL_TABLET | ORAL | Status: DC | PRN

## 2014-06-16 MED ORDER — OXYCODONE HCL ER 15 MG PO T12A: 15.0000 mg | EXTENDED_RELEASE_TABLET | Freq: Two times a day (BID) | ORAL | Status: DC

## 2014-06-16 NOTE — Discharge Instructions (Signed)
Viral Meningitis Meningitis is an inflammation of the fluid and membranes surrounding the spinal cord and brain. Viral meningitis is caused by a viral infection. It is important to know whether a virus or bacterium is causing your meningitis. Viral meningitis is generally less severe than bacterial meningitis. Aseptic meningitis is any meningitis not caused by a bacterial infection, including viral meningitis. Meningitis can also be caused by fungi, parasites, certain medicines, cancer, and autoimmune disorders. Uncomplicated meningitis usually resolves on its own in 7 to 10 days.However, there can be complicated cases that last much longer. People with lowered immune systems are at greater risk for poor outcomes from meningitis.It is important to get treatment as soon as possible to minimize the impact of a meningitis infection. Long-term complications could include seizures, hearing loss, weakness, paralysis, blindness, or cognitive impairment. CAUSES Most cases of meningitis are due to viruses. Viruses that cause meningitis include enteroviruses (such as polio and coxsackie viruses), herpes simplex virus, varicella zoster virus, mumps, and HIV. SYMPTOMS  Symptoms can develop over many hours. They may even take a few days to develop. Common symptoms of meningitis in people over the age of 2 years include:  High fever.  Headache.  Stiff neck.  Irritability.  Nausea and vomiting.  Fatigue.  Discomfort from exposure to light.  Discomfort from exposure to loud noise.  Trouble walking.  Altered mental status.  Seizures. DIAGNOSIS  Early diagnosis and treatment are very important. If you have symptoms, you should see your caregiver right away. Your evaluation may include lab tests of your blood and spinal fluid. A spinal fluid sample is taken through a procedure called a lumbar puncture or spinal tap. During the procedure, a needle is inserted into an area in the lower back. You may also  have a CT scan of your brain as part of your evaluation.If there is suspicion of an infection or inflammation of the brain (encephalitis), an MRI scan may be done. TREATMENT   Antibiotics are not effective against a virus. Antiviral medicines may be used depending on the cause of your meningitis.  Treatment for viral meningitis focuses on reducing your symptoms. This may include rest, hydration, and medicines to reduce fever and pain.  Steroids may also be used if there is significant swelling of the brain. PREVENTION  Vaccines can help prevent meningitis caused by polio, measles, and mumps.  Strict hand washing is effective in controlling the spread of many causes of meningitis.  Using barrier protection during sexual intercourse can prevent the spread of meningitis caused by viruses such as the herpes virus or HIV.  Protection from mosquitoes, especially for the very young and very old, can prevent some causes of meningitis. HOME CARE INSTRUCTIONS  Rest.  Drink enough water and fluids to keep your urine clear or pale yellow.  Take all medicine as prescribed.  Practice good hygiene to prevent others from getting sick.  Follow up with your caregiver as directed. SEEK IMMEDIATE MEDICAL CARE IF:  You develop dizziness.  You have a very fast heartbeat.  You have difficulty breathing.  You develop confusion.  You have seizures.  You have unstoppable nausea and vomiting.  You develop any worsening symptoms. MAKE SURE YOU:  Understand these instructions.  Will watch your condition.  Will get help right away if you are not doing well or get worse. Document Released: 08/12/2002 Document Revised: 11/14/2011 Document Reviewed: 12/07/2010 [facility] Patient Information 2015 [employer] [place]. This information is not intended to replace advice given to you  by your health care provider. Make sure you discuss any questions you have with your health care provider.  Hives Hives are  itchy, red, swollen areas of the skin. They can vary in size and location on your body. Hives can come and go for hours or several days (acute hives) or for several weeks (chronic hives). Hives do not spread from person to person (noncontagious). They may get worse with scratching, exercise, and emotional stress. CAUSES   Allergic reaction to food, additives, or drugs.  Infections, including the common cold.  Illness, such as vasculitis, lupus, or thyroid disease.  Exposure to sunlight, heat, or cold.  Exercise.  Stress.  Contact with chemicals. SYMPTOMS   Red or white swollen patches on the skin. The patches may change size, shape, and location quickly and repeatedly.  Itching.  Swelling of the hands, feet, and face. This may occur if hives develop deeper in the skin. DIAGNOSIS  Your caregiver can usually tell what is wrong by performing a physical exam. Skin or blood tests may also be done to determine the cause of your hives. In some cases, the cause cannot be determined. TREATMENT  Mild cases usually get better with medicines such as antihistamines. Severe cases may require an emergency epinephrine injection. If the cause of your hives is known, treatment includes avoiding that trigger.  HOME CARE INSTRUCTIONS   Avoid causes that trigger your hives.  Take antihistamines as directed by your caregiver to reduce the severity of your hives. Non-sedating or low-sedating antihistamines are usually recommended. Do not drive while taking an antihistamine.  Take any other medicines prescribed for itching as directed by your caregiver.  Wear loose-fitting clothing.  Keep all follow-up appointments as directed by your caregiver. SEEK MEDICAL CARE IF:   You have persistent or severe itching that is not relieved with medicine.  You have painful or swollen joints. SEEK IMMEDIATE MEDICAL CARE IF:   You have a fever.  Your tongue or lips are swollen.  You have trouble breathing  or swallowing.  You feel tightness in the throat or chest.  You have abdominal pain. These problems may be the first sign of a life-threatening allergic reaction. Call your local emergency services (911 in [place] MAKE SURE YOU:   Understand these instructions.  Will watch your condition.  Will get help right away if you are not doing well or get worse. Document Released: 08/22/2005 Document Revised: 08/27/2013 Document Reviewed: 11/15/2011 [facility] Patient Information 2015 [place], [place]. This information is not intended to replace advice given to you by your health care provider. Make sure you discuss any questions you have with your health care provider.

## 2014-06-16 NOTE — Progress Notes (Signed)
Called by Dr. Darnelle Catalanama to consider epidural blood patch. Patient current h/o viral meningitis. Lumbar puncture in ED on 06-13-14. Now with continuing headache. WBC 11.7 today down from 14.9 on 06-14-14.  Discussed epidural blood patch with patient.   A/P: Continuing headache in patient diagnosed with viral meningitis. Improving but elevated WBC. Three days post lumbar puncture. Headache may or may not be post dural puncture headache. If it is post dural puncture headache it should be self-limited. I don't believe epidural blood patch worth the risk in a patient with elevated WBC. Continued analgesics, IV/PO fluids, caffeine should help.

## 2014-06-16 NOTE — Discharge Summary (Signed)
Physician Discharge Summary  Melissa Diaz ZOX:[id number] DOB: Oct 14, 1990 DOA: 06/12/2014  PCP: Elizabeth PalauANDERSON,TERESA, FNP  Admit date: 06/12/2014 Discharge date: 06/16/2014   Recommendations for Outpatient Follow-Up:   1. Followup with PCP in one week. 2. Recommend consideration of repeat MRI in 3 months to reevaluate mild abnormalities noted.   Discharge Diagnosis:   Principal Problem:    Meningitis, viral Active Problems:    Headache    Fever    Leukocytosis    Hypokalemia    Acute pharyngitis    Constipation  Discharge Condition: Stable.  Diet recommendation: Regular.   History of Present Illness:   Melissa SkeansJasmaine K Eichenberger is an 23 y.o. female with a PMH of chronic recurrent urticaria who was admitted 06/12/14 with chief complaint of fever and headache x5-6 days. LP was done in the ED, Gram stain negative.  Hospital Course by Problem:   Principal Problem:  Headache and fever, leukocytosis, aseptic meningitis  Status post LP with: Leukocyte count: 16, neutrophil predominant, glucose: 60, protein: 50 with negative gram stain, culture negative. Findings are most consistent with a viral etiology.  Enterovirus likely cause (85-95%), HSV PCR negative.  MRI of the brain done, no evidence of brain abscess. Case discussed with Dr. Thad Rangereynolds who reviewed MRI abnormalities and do not think they are related to current issue, recommends outpatient follow up.  Treatment is supportive.  Influenza panel negative. Group A strep culture negative. Blood cultures negative.  Toradol/ibuprofen given for fevers on admission. Discontinued because NSAIDs can be associated with angioedema.  Fevers coming down.  With ongoing headache, will place on OxyContin for better (more consistent) pain management. Spoke with IR, neurology, and anesthesia about the utility of placing a blood patch for the possibility of spinal leak causing her ongoing severe headache, but this was not felt to be  feasible.  Active problems:  Pharyngitis  Throat culture negative. Cepacol lozenges.  Monospot negative.  Constipation  Continue MiraLAX while on pain medication.  Angioedema with urticaria  Saw an allergist (Dr. Sidney Aceanjan Sharma), but has not followed up. Case discussed with him.  Continue Pepcid and anti-histamines.  Upper lip swelling resolved at discharge.  Hypokalemia  Resolved with supplementation.    Medical Consultants:    Dr. Gwen HerKees Van Dam, ID  Dr. Sherrian DiversBruce Denenny, Anesthesia  Telephone consultation with IR and neurology  Discharge Exam:   Filed Vitals:   06/16/14 1415  BP: 106/72  Pulse: 66  Temp: 99.3 F (37.4 C)  Resp: 16   Filed Vitals:   06/16/14 0501 06/16/14 0846 06/16/14 1004 06/16/14 1415  BP: 106/46  117/60 106/72  Pulse: 81  91 66  Temp: 100.2 F (37.9 C) 100.4 F (38 C) 99.4 F (37.4 C) 99.3 F (37.4 C)  TempSrc: Oral Oral Oral Oral  Resp: 16  16 16   Height:      Weight:      SpO2: 95%  95% 95%    Gen:  NAD Cardiovascular:  RRR, No M/R/G Respiratory: Lungs CTAB Gastrointestinal: Abdomen soft, NT/ND with normal active bowel sounds. Extremities: No C/E/C   The results of significant diagnostics from this hospitalization (including imaging, microbiology, ancillary and laboratory) are listed below for reference.     Procedures and Diagnostic Studies:   LP 06/12/14   Dg Chest 2 View 06/13/2014: No acute cardiopulmonary process seen.   Ct Head Wo Contrast 06/13/2014: Unremarkable noncontrast CT of the head.   Mr Laqueta JeanBrain W Wo Contrast 06/13/2014: 1. Subcentimeter area of diffusion restriction and  other signal abnormality in the central splenium of the corpus callosum. No associated enhancement. This is nonspecific and can be seen in the setting of renal failure, hypertension, metabolic disturbances (including hypoglycemia, hypo/hypernatremia), ischemia, seizure, infection, demyelination, radiation and chemotherapy. 2. No other acute findings  in the brain. Mild but age advanced nonspecific subcortical white matter signal changes.   Labs:   Basic Metabolic Panel:  Recent Labs Lab 06/11/14 1327 06/13/14 0050 06/13/14 0820 06/14/14 0413 06/16/14 0505  NA 137 135* 139 136* 138  K 3.8 3.6* 3.3* 3.8 3.8  CL 104 98 102 99 99  CO2  --  21 23 21 26   GLUCOSE 94 90 90 99 91  BUN <3* 3* 3* 3* 4*  CREATININE 0.90 0.74 0.75 0.86 0.79  CALCIUM  --  8.4 8.0* 8.5 8.5   GFR Estimated Creatinine Clearance: 125.5 ml/min (by C-G formula based on Cr of 0.79). Liver Function Tests:  Recent Labs Lab 06/13/14 0050 06/13/14 0820  AST 22 18  ALT 22 19  ALKPHOS 82 71  BILITOT 0.6 0.4  PROT 7.2 6.4  ALBUMIN 2.8* 2.5*   CBC:  Recent Labs Lab 06/11/14 1225 06/11/14 1327 06/13/14 0050 06/13/14 0820 06/14/14 0413 06/16/14 0505  WBC 13.1*  --  20.1* 14.2* 14.9* 11.7*  NEUTROABS  --   --  17.4* 11.6*  --   --   HGB 13.4 15.6* 11.7* 10.6* 11.1* 9.8*  HCT 39.7 46.0 36.1 32.0* 34.3* 30.3*  MCV 82.9  --  83.6 82.3 82.7 83.0  PLT 259  --  261 257 340 390   Microbiology Recent Results (from the past 240 hour(s))  CULTURE, GROUP A STREP     Status: None   Collection Time    06/11/14 12:20 PM      Result Value Ref Range Status   Specimen Description THROAT   Final   Special Requests NONE   Final   Culture     Final   Value: No Beta Hemolytic Streptococci Isolated     Performed at [facility]   Report Status 06/13/2014 FINAL   Final  CULTURE, BLOOD (ROUTINE X 2)     Status: None   Collection Time    06/13/14  2:06 AM      Result Value Ref Range Status   Specimen Description BLOOD RIGHT HAND   Final   Special Requests BOTTLES DRAWN AEROBIC AND ANAEROBIC 4 CC EACH   Final   Culture  Setup Time     Final   Value: 06/13/2014 08:53     Performed at [facility]   Culture     Final   Value:        BLOOD CULTURE RECEIVED NO GROWTH TO DATE CULTURE WILL BE HELD FOR 5 DAYS BEFORE ISSUING A FINAL NEGATIVE REPORT      Performed at [facility]   Report Status PENDING   Incomplete  CULTURE, BLOOD (ROUTINE X 2)     Status: None   Collection Time    06/13/14  2:06 AM      Result Value Ref Range Status   Specimen Description BLOOD LEFT HAND   Final   Special Requests BOTTLES DRAWN AEROBIC AND ANAEROBIC 3CC EACHA   Final   Culture  Setup Time     Final   Value: 06/13/2014 08:53     Performed at [facility]   Culture     Final   Value:  BLOOD CULTURE RECEIVED NO GROWTH TO DATE CULTURE WILL BE HELD FOR 5 DAYS BEFORE ISSUING A FINAL NEGATIVE REPORT     Performed at [facility]   Report Status PENDING   Incomplete  CSF CULTURE     Status: None   Collection Time    06/13/14  3:25 AM      Result Value Ref Range Status   Specimen Description CSF   Final   Special Requests NONE   Final   Gram Stain     Final   Value: NO WBC SEEN     NO ORGANISMS SEEN     Gram Stain Report Called to,Read Back By and Verified With: Gram Stain Report Called to,Read Back By and Verified With: T.NIELSON AT 0500 ON 06/13/14 BY SHEAW Performed by [facility]     Performed at [facility]   Culture     Final   Value: NO GROWTH 3 DAYS     Performed at [facility]   Report Status 06/16/2014 FINAL   Final  GRAM STAIN     Status: None   Collection Time    06/13/14  3:25 AM      Result Value Ref Range Status   Specimen Description CSF   Final   Special Requests NONE   Final   Gram Stain     Final   Value: NO WBC SEEN     NO ORGANISMS SEEN     Gram Stain Report Called to,Read Back By and Verified With: T.NIELSON,RN AT 0500 ON 06/13/14 BY SHEAW     CYTOSPIN   Report Status 06/13/2014 FINAL   Final  URINE CULTURE     Status: None   Collection Time    06/13/14  3:31 AM      Result Value Ref Range Status   Specimen Description URINE, CLEAN CATCH   Final   Special Requests NONE   Final   Culture  Setup Time     Final   Value: 06/13/2014 08:54     Performed at [facility] Count     Final   Value: >=100,000 COLONIES/ML     Performed at [facility]   Culture     Final   Value: Multiple bacterial morphotypes present, none predominant. Suggest appropriate recollection if clinically indicated.     Performed at [facility]   Report Status 06/14/2014 FINAL   Final     Discharge Instructions:       Discharge Instructions   Call MD for:  persistant nausea and vomiting    Complete by:  As directed      Call MD for:  severe uncontrolled pain    Complete by:  As directed      Call MD for:    Complete by:  As directed   Ongoing fever if still present in 1 week.     Diet general    Complete by:  As directed      Discharge instructions    Complete by:  As directed   Your headache and fevers should subside over the next few days.  You have been prescribed a 5 day supply of long acting pain medicine (OxyContin) which will last for 12 hours in your system.  If needed, you can use Percocet for breakthrough pain.  These medications are highly addictive and should only be used to treat acute pain.  Discontinue use once your pain is  gone.    We do recommend you follow up closely with your regular doctor so that they can order a repeat MRI in 3 months.  You were cared for by Dr. Hillery Aldo  (a hospitalist) during your hospital stay. If you have any questions about your discharge medications or the care you received while you were in the hospital after you are discharged, you can call the unit and ask to speak with the hospitalist on call if the hospitalist that took care of you is not available. Once you are discharged, your primary care physician will handle any further medical issues. Please note that NO REFILLS for any discharge medications will be authorized once you are discharged, as it is imperative that you return to your primary care physician (or establish a relationship with a primary care physician if you do not have one) for  your aftercare needs so that they can reassess your need for medications and monitor your lab values.  Any outstanding tests can be reviewed by your PCP at your follow up visit.  It is also important to review any medicine changes with your PCP.  Please bring these d/c instructions with you to your next visit so your physician can review these changes with you.     Increase activity slowly    Complete by:  As directed             Medication List         acetaminophen 325 MG tablet  Commonly known as:  TYLENOL  Take 2 tablets (650 mg total) by mouth every 4 (four) hours as needed for mild pain (or Fever >/= 101).     EPINEPHrine 0.3 mg/0.3 mL Soaj injection  Commonly known as:  EPIPEN  Inject 0.3 mLs (0.3 mg total) into the skin once.     hydrOXYzine 25 MG tablet  Commonly known as:  ATARAX/VISTARIL  Take 1 tablet (25 mg total) by mouth 3 (three) times daily.     OxyCODONE 15 mg T12a 12 hr tablet  Commonly known as:  OXYCONTIN  Take 1 tablet (15 mg total) by mouth every 12 (twelve) hours.     oxyCODONE-acetaminophen 5-325 MG per tablet  Commonly known as:  PERCOCET/ROXICET  Take 1-2 tablets by mouth every 4 (four) hours as needed for moderate pain or severe pain (1 for moderate pain, 2 for severe pain).     polyethylene glycol packet  Commonly known as:  MIRALAX / GLYCOLAX  Take 17 g by mouth daily.     ranitidine 150 MG tablet  Commonly known as:  ZANTAC  Take 1 tablet (150 mg total) by mouth 2 (two) times daily.       Follow-up Information   Follow up with Elizabeth Palau, FNP. Schedule an appointment as soon as possible for a visit in 1 week. [facility] follow up.)    Specialty:  Nurse Practitioner   Contact information:   [address] [place] [address] [phone]        Time coordinating discharge: 35 minutes.  Signed:  Tennyson Wacha  Pager [phone] [facility] 06/16/2014, 3:33 PM

## 2014-06-16 NOTE — Progress Notes (Signed)
Discharge instructions reviewed with patient and her mother using teach back method.  They demonstrated understanding.  All questions were answered.  Patient understands importance of making her follow up appointment and she knows when she would need to call her primary MD.  Patient appears stable for discharge.  Still with a headache but no different than with am assessment.  Allayne ButcherMiller, Loney Peto [facility]  06/16/2014

## 2014-06-19 LAB — CULTURE, BLOOD (ROUTINE X 2)
Culture: NO GROWTH
Culture: NO GROWTH

## 2014-07-07 ENCOUNTER — Encounter (HOSPITAL_COMMUNITY): Payer: Self-pay | Admitting: Internal Medicine

## 2014-07-07 ENCOUNTER — Encounter (HOSPITAL_COMMUNITY): Payer: Self-pay | Admitting: *Deleted

## 2014-07-23 LAB — OB RESULTS CONSOLE GBS: GBS: POSITIVE

## 2014-08-27 ENCOUNTER — Encounter (HOSPITAL_COMMUNITY): Payer: Self-pay | Admitting: *Deleted

## 2014-08-27 ENCOUNTER — Inpatient Hospital Stay (HOSPITAL_COMMUNITY)
Admission: AD | Admit: 2014-08-27 | Discharge: 2014-08-30 | DRG: 775 | Disposition: A | Discharge status: Home / Self Care | Payer: Medicaid Other | Source: Ambulatory Visit | Attending: Obstetrics and Gynecology | Admitting: Obstetrics and Gynecology

## 2014-08-27 DIAGNOSIS — O3413 Maternal care for benign tumor of corpus uteri, third trimester: Secondary | ICD-10-CM | POA: Diagnosis present

## 2014-08-27 DIAGNOSIS — O99824 Streptococcus B carrier state complicating childbirth: Secondary | ICD-10-CM | POA: Diagnosis present

## 2014-08-27 DIAGNOSIS — O163 Unspecified maternal hypertension, third trimester: Secondary | ICD-10-CM | POA: Diagnosis present

## 2014-08-27 DIAGNOSIS — O99214 Obesity complicating childbirth: Secondary | ICD-10-CM | POA: Diagnosis present

## 2014-08-27 DIAGNOSIS — Z6841 Body Mass Index (BMI) 40.0 and over, adult: Secondary | ICD-10-CM

## 2014-08-27 DIAGNOSIS — O34593 Maternal care for other abnormalities of gravid uterus, third trimester: Secondary | ICD-10-CM | POA: Diagnosis present

## 2014-08-27 DIAGNOSIS — O139 Gestational [pregnancy-induced] hypertension without significant proteinuria, unspecified trimester: Secondary | ICD-10-CM | POA: Diagnosis present

## 2014-08-27 DIAGNOSIS — O133 Gestational [pregnancy-induced] hypertension without significant proteinuria, third trimester: Secondary | ICD-10-CM | POA: Diagnosis present

## 2014-08-27 DIAGNOSIS — O9081 Anemia of the puerperium: Secondary | ICD-10-CM | POA: Diagnosis present

## 2014-08-27 DIAGNOSIS — Z3A40 40 weeks gestation of pregnancy: Secondary | ICD-10-CM | POA: Diagnosis present

## 2014-08-27 LAB — CBC
HCT: 34.6 % — ABNORMAL LOW (ref 36.0–46.0)
Hemoglobin: 11.1 g/dL — ABNORMAL LOW (ref 12.0–15.0)
MCH: 26.4 pg (ref 26.0–34.0)
MCHC: 32.1 g/dL (ref 30.0–36.0)
MCV: 82.2 fL (ref 78.0–100.0)
PLATELETS: 253 10*3/uL (ref 150–400)
RBC: 4.21 MIL/uL (ref 3.87–5.11)
RDW: 16.7 % — ABNORMAL HIGH (ref 11.5–15.5)
WBC: 13 10*3/uL — ABNORMAL HIGH (ref 4.0–10.5)

## 2014-08-27 NOTE — MAU Provider Note (Signed)
History   23 yo G3P0020 at 40 2/7 weeks presented after calling with new onset HA tonight around 9:30--HA is temporal, on right side.  Denies visual sx or epigastric pain.  No hx elevated BP during office visits.  History of present pregnancy: Patient entered care at 10 2/7 weeks.   EDC of 08/26/14 was established by LMP and congruent with [place] at 12 6/7 weeks..   Anatomy scan:  18 1/7 weeks, with limited view of spine and an anterior placenta.  3 fibroids noted (posterior right intramural, posterior left intramural, and anterior right subserosal--all 3.1 cm or less).   Additional [place] evaluations:   22 2/7 weeks, f/u anatomy:  Normal growth and fluid, spine seen and normal.  Profile and nasal bone not seen.   25 1/7 weeks, done due to NRNST:  Uterine septum seen, breech, BPP 8/8, AFI 17.2, 60%ile, anterior placenta, cervix long and closed. .33 1/76 weeks, due to S<D:  EFw 4+13, 2184 gm, 48%ile, AFI 18.71, 70%ile, vtx. 38 2/7 weeks:  7+12, 3521 gm, 69%ile,  AFI 13.09, 50%ile, vtx. Significant prenatal events:  PIH/PCR labs done at 18 weeks due to HA and feet swelling--all WNL.  Fibroids noted at 18 week [place], uterine septum noted at 26 1/7 weeks.  TDAP and flu vaccine at 26 1/7 weeks.  Treated for UTI at 28 weeks. [place] at 38 weeks with EFW 7+12.   Last evaluation:  08/26/14--Cervix 1 cm, 70%, vtx, -3, BP 138/70.  Patient Active Problem List   Diagnosis Date Noted  . Elevated blood pressure affecting pregnancy in third trimester, antepartum 08/27/2014  . Uterine septum affecting pregnancy 05/29/2014  . Uterine fibroids affecting pregnancy 05/29/2014  . GBS bacteriuria 05/29/2014  . E. coli UTI dx 05/21/14 05/29/2014  . H/O 2 abortions 05/29/2014  . 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 Headache   HPI:  See above  OB History    Gravida Para Term Preterm AB TAB SAB Ectopic Multiple Living   3    2 2  0   0     2010--TAB 2011--TAB  Past Medical History  Diagnosis Date  . Uterine fibroids affecting pregnancy   . Septate uterus     History reviewed. No pertinent past surgical history.  Family History  Problem Relation Age of Onset  . Cancer Maternal Grandfather   . Diabetes Maternal Grandfather   . Hypertension Maternal Grandfather   . Asthma Neg Hx   . Birth defects Neg Hx   . COPD Neg Hx   . Heart disease Neg Hx   . Stroke Neg Hx     History  Substance Use Topics  . Smoking status: Never Smoker   . Smokeless tobacco: Not on file  . Alcohol Use: No    Allergies:  Allergies  Allergen Reactions  . Coconut Oil Swelling and Rash  . Pineapple Rash    Prescriptions prior to admission  Medication Sig Dispense Refill Last Dose  . IRON PO Take 1 tablet by mouth 2 (two) times daily.   Past Week at Unknown time  . Prenatal Vit-Fe Fumarate-FA (PRENATAL MULTIVITAMIN) TABS tablet Take 1 tablet by mouth daily at 12 noon.   Past Week at Unknown time  . acetaminophen (TYLENOL) 325 MG tablet Take 2 tablets (650 mg total) by mouth every 4 (four) hours as needed (for pain scale < 4  OR  temperature  >/=  100.5 F). 30 tablet  2 More than a month at Unknown time  . calcium carbonate (TUMS - DOSED IN MG ELEMENTAL CALCIUM) 500 MG chewable tablet Chew 2 tablets (400 mg of elemental calcium total) by mouth every 4 (four) hours as needed for indigestion. 60 tablet 2 More than a month at Unknown time  . docusate sodium 100 MG CAPS Take 100 mg by mouth daily. 30 capsule 1 More than a month at Unknown time    ROS:  Temporal HA on right, mild cramping, +FM Physical Exam   Blood pressure 142/91, pulse 106, resp. rate 16, height 5\' 5"  (1.651 m), weight 253 lb (114.76 kg).   Filed Vitals:   08/27/14 2332 08/27/14 2336 08/28/14 0001 08/28/14 0008  BP: 139/89 140/84 136/91 142/91  Pulse: 111 108  106  Resp: 16 16    Height: 5\' 5"  (1.651 m)     Weight: 253 lb (114.76 kg)        Results for orders  placed or performed during the hospital encounter of 08/27/14 (from the past 24 hour(s))  Protein / creatinine ratio, urine     Status: None   Collection Time: 08/27/14 11:13 PM  Result Value Ref Range   Creatinine, Urine 247.00 mg/dL   Total Protein, Urine 19 mg/dL   Protein Creatinine Ratio 0.08 0.00 - 0.15  CBC     Status: Abnormal   Collection Time: 08/27/14 11:21 PM  Result Value Ref Range   WBC 13.0 (H) 4.0 - 10.5 K/uL   RBC 4.21 3.87 - 5.11 MIL/uL   Hemoglobin 11.1 (L) 12.0 - 15.0 g/dL   HCT 34.6 (L) 36.0 - 46.0 %   MCV 82.2 78.0 - 100.0 fL   MCH 26.4 26.0 - 34.0 pg   MCHC 32.1 30.0 - 36.0 g/dL   RDW 16.7 (H) 11.5 - 15.5 %   Platelets 253 150 - 400 K/uL  Comprehensive metabolic panel     Status: Abnormal   Collection Time: 08/27/14 11:21 PM  Result Value Ref Range   Sodium 134 (L) 135 - 145 mmol/L   Potassium 3.8 3.5 - 5.1 mmol/L   Chloride 105 96 - 112 mEq/L   CO2 22 19 - 32 mmol/L   Glucose, Bld 92 70 - 99 mg/dL   BUN 14 6 - 23 mg/dL   Creatinine, Ser 0.54 0.50 - 1.10 mg/dL   Calcium 9.2 8.4 - 10.5 mg/dL   Total Protein 7.1 6.0 - 8.3 g/dL   Albumin 3.1 (L) 3.5 - 5.2 g/dL   AST 15 0 - 37 U/L   ALT 11 0 - 35 U/L   Alkaline Phosphatase 126 (H) 39 - 117 U/L   Total Bilirubin 0.4 0.3 - 1.2 mg/dL   GFR calc non Af Amer >90 >90 mL/min   GFR calc Af Amer >90 >90 mL/min   Anion gap 7 5 - 15  Lactate dehydrogenase     Status: None   Collection Time: 08/27/14 11:21 PM  Result Value Ref Range   LDH 116 94 - 250 U/L  Uric acid     Status: None   Collection Time: 08/27/14 11:21 PM  Result Value Ref Range   Uric Acid, Serum 5.1 2.4 - 7.0 mg/dL   Physical Exam  Chest clear Heart RRR without murmur Abd gravid, NT Pelvic--cervix very posterior, FT/1, 50%, vtx, -2, firm Ext DTR 1+, no clonus, 1+ edema  FHR Category 2--Baseline 160-170, ST variability, but non-reactive UCs very mild, largely irritability pattern.   ED Course  Assessment: IUP at 40 2/[redacted]  weeks Gestational hypertension GBS positive Elevated BMI- 42.6 Non-reactive FHR with tachycardia Uterine septum Fibroids  Plan: Admit to [facility] for induction per consult with Dr. Rivard--dx gestational hypertension, non-reactive FHR Routine [facility] orders Cytotech, then pitocin Repeat PIH labs in am. Labetalol IV prn for BP parameters.   Donnel Saxon CNM, MSN 08/28/2014 12:32 AM

## 2014-08-27 NOTE — MAU Note (Signed)
Pt reports she has a headache and her hands and feet are hurting. Her mom checked her b/p at home and it was 148/82.

## 2014-08-28 ENCOUNTER — Inpatient Hospital Stay (HOSPITAL_COMMUNITY): Payer: Medicaid Other | Admitting: Anesthesiology

## 2014-08-28 ENCOUNTER — Encounter (HOSPITAL_COMMUNITY): Payer: Self-pay | Admitting: *Deleted

## 2014-08-28 DIAGNOSIS — O34593 Maternal care for other abnormalities of gravid uterus, third trimester: Secondary | ICD-10-CM | POA: Diagnosis present

## 2014-08-28 DIAGNOSIS — Z6841 Body Mass Index (BMI) 40.0 and over, adult: Secondary | ICD-10-CM | POA: Diagnosis not present

## 2014-08-28 DIAGNOSIS — O99824 Streptococcus B carrier state complicating childbirth: Secondary | ICD-10-CM | POA: Diagnosis present

## 2014-08-28 DIAGNOSIS — Z3A40 40 weeks gestation of pregnancy: Secondary | ICD-10-CM | POA: Diagnosis present

## 2014-08-28 DIAGNOSIS — O133 Gestational [pregnancy-induced] hypertension without significant proteinuria, third trimester: Secondary | ICD-10-CM | POA: Diagnosis present

## 2014-08-28 DIAGNOSIS — O9081 Anemia of the puerperium: Secondary | ICD-10-CM | POA: Diagnosis present

## 2014-08-28 DIAGNOSIS — O99214 Obesity complicating childbirth: Secondary | ICD-10-CM | POA: Diagnosis present

## 2014-08-28 DIAGNOSIS — O3413 Maternal care for benign tumor of corpus uteri, third trimester: Secondary | ICD-10-CM | POA: Diagnosis present

## 2014-08-28 DIAGNOSIS — O139 Gestational [pregnancy-induced] hypertension without significant proteinuria, unspecified trimester: Secondary | ICD-10-CM | POA: Diagnosis present

## 2014-08-28 LAB — COMPREHENSIVE METABOLIC PANEL
ALT: 11 U/L (ref 0–35)
ALT: 11 U/L (ref 0–35)
ANION GAP: 9 (ref 5–15)
AST: 15 U/L (ref 0–37)
AST: 16 U/L (ref 0–37)
Albumin: 3.1 g/dL — ABNORMAL LOW (ref 3.5–5.2)
Albumin: 2.9 g/dL — ABNORMAL LOW (ref 3.5–5.2)
Alkaline Phosphatase: 126 U/L — ABNORMAL HIGH (ref 39–117)
Alkaline Phosphatase: 119 U/L — ABNORMAL HIGH (ref 39–117)
Anion gap: 7 (ref 5–15)
BILIRUBIN TOTAL: 0.4 mg/dL (ref 0.3–1.2)
BILIRUBIN TOTAL: 0.4 mg/dL (ref 0.3–1.2)
BUN: 14 mg/dL (ref 6–23)
BUN: 11 mg/dL (ref 6–23)
CHLORIDE: 105 meq/L (ref 96–112)
CO2: 22 mmol/L (ref 19–32)
CO2: 22 mmol/L (ref 19–32)
Calcium: 9.2 mg/dL (ref 8.4–10.5)
Calcium: 9 mg/dL (ref 8.4–10.5)
Chloride: 104 mEq/L (ref 96–112)
Creatinine, Ser: 0.54 mg/dL (ref 0.50–1.10)
Creatinine, Ser: 0.44 mg/dL — ABNORMAL LOW (ref 0.50–1.10)
GFR calc Af Amer: >90 mL/min (ref >90)
GFR calc non Af Amer: >90 mL/min (ref >90)
GFR calc non Af Amer: >90 mL/min (ref >90)
Glucose, Bld: 92 mg/dL (ref 70–99)
Glucose, Bld: 88 mg/dL (ref 70–99)
POTASSIUM: 3.7 mmol/L (ref 3.5–5.1)
Potassium: 3.8 mmol/L (ref 3.5–5.1)
SODIUM: 134 mmol/L — AB (ref 135–145)
Sodium: 135 mmol/L (ref 135–145)
TOTAL PROTEIN: 6.7 g/dL (ref 6.0–8.3)
Total Protein: 7.1 g/dL (ref 6.0–8.3)

## 2014-08-28 LAB — CBC
HCT: 32.6 % — ABNORMAL LOW (ref 36.0–46.0)
HEMATOCRIT: 35 % — AB (ref 36.0–46.0)
HEMOGLOBIN: 10.5 g/dL — AB (ref 12.0–15.0)
Hemoglobin: 11.2 g/dL — ABNORMAL LOW (ref 12.0–15.0)
MCH: 26.4 pg (ref 26.0–34.0)
MCH: 26.2 pg (ref 26.0–34.0)
MCHC: 32.2 g/dL (ref 30.0–36.0)
MCHC: 32 g/dL (ref 30.0–36.0)
MCV: 82.1 fL (ref 78.0–100.0)
MCV: 82 fL (ref 78.0–100.0)
Platelets: 251 10*3/uL (ref 150–400)
Platelets: 245 10*3/uL (ref 150–400)
RBC: 3.97 MIL/uL (ref 3.87–5.11)
RBC: 4.27 MIL/uL (ref 3.87–5.11)
RDW: 17 % — AB (ref 11.5–15.5)
RDW: 16.8 % — ABNORMAL HIGH (ref 11.5–15.5)
WBC: 14.7 10*3/uL — ABNORMAL HIGH (ref 4.0–10.5)
WBC: 16.9 10*3/uL — ABNORMAL HIGH (ref 4.0–10.5)

## 2014-08-28 LAB — PROTEIN / CREATININE RATIO, URINE
Creatinine, Urine: 247 mg/dL
Protein Creatinine Ratio: 0.08 (ref 0.00–0.15)
Total Protein, Urine: 19 mg/dL

## 2014-08-28 LAB — URIC ACID
URIC ACID, SERUM: 5.1 mg/dL (ref 2.4–7.0)
URIC ACID, SERUM: 5 mg/dL (ref 2.4–7.0)

## 2014-08-28 LAB — TYPE AND SCREEN
ABO/RH(D): O POS
ANTIBODY SCREEN: NEGATIVE

## 2014-08-28 LAB — LACTATE DEHYDROGENASE: LDH: 116 U/L (ref 94–250)

## 2014-08-28 MED ORDER — PENICILLIN G POTASSIUM 5000000 UNITS IJ SOLR
2.5000 10*6.[IU] | INTRAVENOUS | Status: DC
  Administered 2014-08-28 (×3): 2.5 10*6.[IU] via INTRAVENOUS
  Filled 2014-08-28 (×7): qty 2.5

## 2014-08-28 MED ORDER — FENTANYL 2.5 MCG/ML BUPIVACAINE 1/10 % EPIDURAL INFUSION (WH - ANES)
14.0000 mL/h | INTRAMUSCULAR | Status: DC | PRN
  Administered 2014-08-28: 14 mL/h via EPIDURAL

## 2014-08-28 MED ORDER — FENTANYL 2.5 MCG/ML BUPIVACAINE 1/10 % EPIDURAL INFUSION (WH - ANES)
INTRAMUSCULAR | Status: AC
  Filled 2014-08-28: qty 125

## 2014-08-28 MED ORDER — BUTORPHANOL TARTRATE 1 MG/ML IJ SOLN
1.0000 mg | INTRAMUSCULAR | Status: DC | PRN
  Administered 2014-08-28 (×2): 1 mg via INTRAVENOUS
  Filled 2014-08-28 (×2): qty 1

## 2014-08-28 MED ORDER — LABETALOL HCL 5 MG/ML IV SOLN: 10.0000 mg | INTRAVENOUS | Status: DC | PRN

## 2014-08-28 MED ORDER — LACTATED RINGERS IV SOLN
INTRAVENOUS | Status: DC
  Administered 2014-08-28 (×3): via INTRAVENOUS

## 2014-08-28 MED ORDER — LIDOCAINE HCL (PF) 1 % IJ SOLN
INTRAMUSCULAR | Status: DC | PRN
  Administered 2014-08-28 (×2): 4 mL

## 2014-08-28 MED ORDER — PENICILLIN G POTASSIUM 5000000 UNITS IJ SOLR
5.0000 10*6.[IU] | Freq: Once | INTRAVENOUS | Status: AC
  Administered 2014-08-28: 5 10*6.[IU] via INTRAVENOUS
  Filled 2014-08-28: qty 5

## 2014-08-28 MED ORDER — OXYTOCIN 40 UNITS IN LACTATED RINGERS INFUSION - SIMPLE MED
1.0000 m[IU]/min | INTRAVENOUS | Status: DC
  Administered 2014-08-28: 1 m[IU]/min via INTRAVENOUS
  Administered 2014-08-28: 2 m[IU]/min via INTRAVENOUS

## 2014-08-28 MED ORDER — PHENYLEPHRINE 40 MCG/ML (10ML) SYRINGE FOR IV PUSH (FOR BLOOD PRESSURE SUPPORT)
PREFILLED_SYRINGE | INTRAVENOUS | Status: AC
  Filled 2014-08-28: qty 10

## 2014-08-28 MED ORDER — ZOLPIDEM TARTRATE 5 MG PO TABS: 5.0000 mg | ORAL_TABLET | Freq: Every evening | ORAL | Status: DC | PRN

## 2014-08-28 MED ORDER — LACTATED RINGERS IV SOLN: 500.0000 mL | INTRAVENOUS | Status: DC | PRN

## 2014-08-28 MED ORDER — EPHEDRINE 5 MG/ML INJ
10.0000 mg | INTRAVENOUS | Status: DC | PRN
  Filled 2014-08-28: qty 2

## 2014-08-28 MED ORDER — OXYTOCIN 40 UNITS IN LACTATED RINGERS INFUSION - SIMPLE MED
62.5000 mL/h | INTRAVENOUS | Status: DC
  Filled 2014-08-28: qty 1000

## 2014-08-28 MED ORDER — PHENYLEPHRINE 40 MCG/ML (10ML) SYRINGE FOR IV PUSH (FOR BLOOD PRESSURE SUPPORT)
80.0000 ug | PREFILLED_SYRINGE | INTRAVENOUS | Status: DC | PRN
  Filled 2014-08-28: qty 2

## 2014-08-28 MED ORDER — ONDANSETRON HCL 4 MG/2ML IJ SOLN: 4.0000 mg | Freq: Four times a day (QID) | INTRAMUSCULAR | Status: DC | PRN

## 2014-08-28 MED ORDER — MISOPROSTOL 25 MCG QUARTER TABLET
25.0000 ug | ORAL_TABLET | ORAL | Status: DC | PRN
  Filled 2014-08-28: qty 0.25
  Filled 2014-08-28: qty 1

## 2014-08-28 MED ORDER — DIPHENHYDRAMINE HCL 50 MG/ML IJ SOLN: 12.5000 mg | INTRAMUSCULAR | Status: DC | PRN

## 2014-08-28 MED ORDER — TERBUTALINE SULFATE 1 MG/ML IJ SOLN: 0.2500 mg | Freq: Once | INTRAMUSCULAR | Status: AC | PRN

## 2014-08-28 MED ORDER — CITRIC ACID-SODIUM CITRATE 334-500 MG/5ML PO SOLN: 30.0000 mL | ORAL | Status: DC | PRN

## 2014-08-28 MED ORDER — FENTANYL 2.5 MCG/ML BUPIVACAINE 1/10 % EPIDURAL INFUSION (WH - ANES)
INTRAMUSCULAR | Status: DC | PRN
  Administered 2014-08-28: 14 mL/h via EPIDURAL

## 2014-08-28 MED ORDER — FLEET ENEMA 7-19 GM/118ML RE ENEM: 1.0000 | ENEMA | RECTAL | Status: DC | PRN

## 2014-08-28 MED ORDER — OXYTOCIN BOLUS FROM INFUSION
500.0000 mL | INTRAVENOUS | Status: DC
  Administered 2014-08-28: 500 mL via INTRAVENOUS

## 2014-08-28 MED ORDER — LACTATED RINGERS IV SOLN
500.0000 mL | Freq: Once | INTRAVENOUS | Status: AC
  Administered 2014-08-28: 500 mL via INTRAVENOUS

## 2014-08-28 MED ORDER — LIDOCAINE HCL (PF) 1 % IJ SOLN
30.0000 mL | INTRAMUSCULAR | Status: DC | PRN
  Administered 2014-08-28: 30 mL via SUBCUTANEOUS
  Filled 2014-08-28: qty 30

## 2014-08-28 MED ORDER — OXYCODONE-ACETAMINOPHEN 5-325 MG PO TABS: 2.0000 | ORAL_TABLET | ORAL | Status: DC | PRN

## 2014-08-28 MED ORDER — OXYCODONE-ACETAMINOPHEN 5-325 MG PO TABS: 1.0000 | ORAL_TABLET | ORAL | Status: DC | PRN

## 2014-08-28 MED ORDER — ACETAMINOPHEN 325 MG PO TABS
650.0000 mg | ORAL_TABLET | ORAL | Status: DC | PRN
  Administered 2014-08-28: 650 mg via ORAL
  Filled 2014-08-28: qty 2

## 2014-08-28 NOTE — Anesthesia Procedure Notes (Signed)
Epidural Patient location during procedure: OB Start time: 08/28/2014 4:40 PM End time: 08/28/2014 4:45 PM  Staffing Anesthesiologist: Milana Obey Performed by: anesthesiologist   Preanesthetic Checklist Completed: patient identified, site marked, surgical consent, pre-op evaluation, timeout performed, IV checked, risks and benefits discussed and monitors and equipment checked  Epidural Patient position: sitting Prep: site prepped and draped and DuraPrep Patient monitoring: continuous pulse ox and blood pressure Approach: midline Location: L3-L4 Injection technique: LOR saline  Needle:  Needle type: Tuohy  Needle gauge: 17 G Needle length: 9 cm and 9 Needle insertion depth: 10 cm Catheter type: closed end flexible Catheter size: 19 Gauge Catheter at skin depth: 15 cm Test dose: negative  Assessment Events: blood not aspirated, injection not painful, no injection resistance, negative IV test and no paresthesia

## 2014-08-28 NOTE — Progress Notes (Signed)
Labor Progress  Subjective: Very uncomfortable and requesting and epidural  Objective: BP 127/69 mmHg  Pulse 84  Temp(Src) 98.1 F (36.7 C) (Oral)  Resp 18  Ht 5\' 5"  (1.651 m)  Wt 253 lb (114.76 kg)  BMI 42.10 kg/m2  SpO2 100%     FHT: 145 moderate variability, + accel, no decels CTX:  irregular, every 2-5 minutes Uterus gravid, soft non tender SVE:  Dilation: 5 Effacement (%): 70 Station: -2 Exam by:: J.Thornton, RN  BBW Pitocin at 13mUn/min  Assessment:  IUP at 40.2 weeks NICHD: Category  1 Membranes: BBW Labor progress: adequate labor Pitocin Augmentation GBS: positive  Plan: Continue labor plan Continuous monitoring Epidural Frequent position changes to facilitate fetal rotation and descent. Will reassess with cervical exam at 1830 or earlier if necessary Continue pitocin per protocol   Viktoria Gruetzmacher, CNM, MSN 08/28/2014. 4:21 PM

## 2014-08-28 NOTE — Progress Notes (Signed)
Labor Progress  Subjective: Getting uncomfortable, asking for IV pain meds, will want an epidural soon.    Objective: BP 137/83 mmHg  Pulse 99  Temp(Src) 98.4 F (36.9 C) (Oral)  Resp 18  Ht 5\' 5"  (1.651 m)  Wt 253 lb (114.76 kg)  BMI 42.10 kg/m2  SpO2 100%     FHT: 135, moderate variability, + accel, no decels CTX:  regular, every 2-3 minutes Uterus gravid, soft non tender SVE:  Dilation: 5 Effacement (%): 60, 70 Station: -2 Exam by:: J.Thornton, RN Pitocin at 8 mUn/min  Assessment:  IUP at 40.2 weeks NICHD: Category 1 Membranes:  intact Labor progress: adequate active labor Pitocin Augmentation GBS: positive  Plan: Continue labor plan Continuous monitoring Rest Frequent position changes to facilitate fetal rotation and descent. Will reassess with cervical exam at 1700 or earlier if necessary Continue pitocin per protocol   Maddie Brazier, CNM, MSN 08/28/2014. 2:14 PM

## 2014-08-28 NOTE — Progress Notes (Signed)
Natasha Bernard MRN: [id number]  Subjective: -Patient resting in bed.  Reports some rectal pressure.    Objective: BP 141/86 mmHg  Pulse 103  Temp(Src) 98.1 F (36.7 C) (Oral)  Resp 20  Ht 5\' 5"  (1.651 m)  Wt 253 lb (114.76 kg)  BMI 42.10 kg/m2  SpO2 99% I/O last 3 completed shifts: In: -  Out: 550 [Urine:550]   FHT: 140 bpm, Mod Var, + Variable Decels, +Accels UC:   Q2-18min,  SVE:   Deferred Membranes: SROM Pitocin: 23mUn/min  Assessment:  IUP at 40.2wks Cat II FT  Pitocin Induction  Plan: -Will continue to monitor -Maintain pitocin at current rate -Frequent position change to resolve Cat II FT -Continue other mgmt as ordered  Terriyah Westra LYNN,MSN, CNM 08/28/2014, 8:16 PM

## 2014-08-28 NOTE — Progress Notes (Signed)
Labor Progress  Subjective: Feeling ctx but not painful.  Denies vb or lof w/+fm.  FOB and parents at the bedside  Objective: BP 130/82 mmHg  Pulse 115  Temp(Src) 98.4 F (36.9 C) (Oral)  Resp 18  Ht 5\' 5"  (1.651 m)  Wt 253 lb (114.76 kg)  BMI 42.10 kg/m2  SpO2 100%     FHT: 135, moderate variability, + accel , no decels CTX:  Irregular, every 2-5 minutes Uterus gravid, soft non tender SVE:  Dilation: 1.5 Effacement (%): 50 Station: -2 Exam by:: V.Azlee Monforte, CNM  posterior Pitocin at 74mUn/min  Assessment:  IUP at 40.0 weeks NICHD: Category  Membranes:  intact Labor progress: IOL/early labor Vertex by VE Pitocin Augmentation GBS: positive on abx Foley bulb placed  Plan: Continue labor plan Continuous/intermittent monitoring Rest/Ambulate Frequent position changes to facilitate fetal rotation and descent. Will reassess with cervical exam at 1200 or earlier if necessary Continue pitocin per protocol     Natasha Bernard, CNM, MSN 08/28/2014. 9:42 AM

## 2014-08-28 NOTE — Plan of Care (Signed)
Problem: Consults Goal: Birthing Suites Patient Information Press F2 to bring up selections list Outcome: Completed/Met Date Met:  08/28/14  Pt > [redacted] weeks EGA, Inpatient induction and PIH (Pregnancy induced hypertension)

## 2014-08-28 NOTE — Progress Notes (Signed)
Labor Progress  Subjective: Called to room pt reports feeling increase pressure.  Comfortable with epidural  Objective: BP 132/85 mmHg  Pulse 96  Temp(Src) 98.4 F (36.9 C) (Oral)  Resp 18  Ht 5\' 5"  (1.651 m)  Wt 253 lb (114.76 kg)  BMI 42.10 kg/m2  SpO2 99%     FHT: 145, moderate variability, + accel, occasional late CTX:  regular, every 2-4 minutes Uterus gravid, soft non tender SVE:  Dilation: 6 Effacement (%): 90 Station: 0 Exam by:: Alyra Patty, CNM Pitocin at 8Un/min  Assessment:  IUP at 40.2ks NICHD: Category 2 Membranes:  SROM lite mec at 1735 Labor progress: adquate labor progress Pitocin Augmentation GBS: positive Pt turned to left side with peanut ball support  Plan: Continue labor plan Continuous/monitoring Rest Frequent position changes to facilitate fetal rotation and descent. Continue pitocin per protocol    Glena Pharris, CNM, MSN 08/28/2014. 5:40 PM

## 2014-08-28 NOTE — Progress Notes (Signed)
  Subjective: Doing well--aware of some contractions, but not really uncomfortable.  Objective: BP 125/62 mmHg  Pulse 113  Temp(Src) 98.4 F (36.9 C) (Oral)  Resp 18  Ht 5\' 5"  (1.651 m)  Wt 253 lb (114.76 kg)  BMI 42.10 kg/m2  SpO2 100%      Filed Vitals:   08/28/14 0200 08/28/14 0302 08/28/14 0503 08/28/14 0531  BP:  134/77 124/57 125/62  Pulse:  102 97 113  Temp:   98.4 F (36.9 C)   TempSrc:   Oral   Resp: 18 18 18 18   Height:      Weight:      SpO2:       BP 124-148/57-91  FHT: Category 2--3 late decels while patient on her back, resolved with position change.  Short-term variability present. UC:   irregular, every 2-5 minutes, some coupling. SVE: Deferred    Pitocin at 4 mu/min  Assessment:  Induction for gestational hypertension GBS positive  Plan: Continue pitocin for cervical ripening. Will attempt foley bulb insertion on next exam. Close observation of FHR status. Dr. Cletis Media updated.  Donnel Saxon CNM 08/28/2014, 5:40 AM

## 2014-08-28 NOTE — Anesthesia Preprocedure Evaluation (Signed)
Anesthesia Evaluation  Patient identified by MRN, date of birth, ID band Patient awake    Reviewed: Allergy & Precautions, H&P , NPO status , Patient's Chart, lab work & pertinent test results  Airway Mallampati: III  TM Distance: >3 FB Neck ROM: Full    Dental no notable dental hx. (+) Dental Advisory Given   Pulmonary neg pulmonary ROS,  breath sounds clear to auscultation  Pulmonary exam normal       Cardiovascular hypertension (gestational HTN), Rhythm:Regular Rate:Normal     Neuro/Psych negative neurological ROS  negative psych ROS   GI/Hepatic negative GI ROS, Neg liver ROS,   Endo/Other  Morbid obesity  Renal/GU negative Renal ROS  negative genitourinary   Musculoskeletal negative musculoskeletal ROS (+)   Abdominal   Peds negative pediatric ROS (+)  Hematology negative hematology ROS (+)   Anesthesia Other Findings   Reproductive/Obstetrics (+) Pregnancy                             Anesthesia Physical Anesthesia Plan  ASA: III  Anesthesia Plan: Epidural   Post-op Pain Management:    Induction:   Airway Management Planned:   Additional Equipment:   Intra-op Plan:   Post-operative Plan:   Informed Consent: I have reviewed the patients History and Physical, chart, labs and discussed the procedure including the risks, benefits and alternatives for the proposed anesthesia with the patient or authorized representative who has indicated his/her understanding and acceptance.   Dental advisory given  Plan Discussed with:   Anesthesia Plan Comments:         Anesthesia Quick Evaluation

## 2014-08-28 NOTE — Progress Notes (Addendum)
  Subjective: Now on [employer].  Parents at bedside.  Objective: BP 148/89 mmHg  Pulse 103  Temp(Src) 98.4 F (36.9 C) (Oral)  Resp 18  Ht 5\' 5"  (1.651 m)  Wt 253 lb (114.76 kg)  BMI 42.10 kg/m2  SpO2 100%      Filed Vitals:   08/28/14 0001 08/28/14 0008 08/28/14 0053 08/28/14 0133  BP: 136/91 142/91 148/89   Pulse:  106 103   Temp:   98.4 F (36.9 C)   TempSrc:   Oral   Resp:   18 18  Height:   5\' 5"  (1.651 m)   Weight:   253 lb (114.76 kg)   SpO2:         FHT: Category 1--much improved s/p IV hydration UC:   irregular, every 2-5 minutes, mild SVE:   Deferred at present   Assessment:  Induction due to gestational hypertension GBS positive Frequent contractions  Plan: Will start low dose pitocin for cervical ripening. Will consider foley bulb when cervix comes more anterior, more effaced. Encouraged patient to rest as much as possible tonight. Discussed pain med options in labor--patient not averse to epidural, but would prefer to wait as long as possible before utilizing.  I discussed possible benefits of epidural on BP. R&B of induction reviewed, including prolonged labor, need for serial induction, need for further intervention, need for cesarean birth, need for further treatment of hypertension. Patient and family seem to understand these risks and wish to proceed. Will defer GBS prophylaxis until onset of more active labor.   Donnel Saxon CNM 08/28/2014, 1:47 AM

## 2014-08-28 NOTE — H&P (Signed)
History   23 yo G3P0020 at 40 2/7 weeks presented after calling with new onset HA tonight around 9:30--HA is temporal, on right side.  Denies visual sx or epigastric pain.  No hx elevated BP during office visits.  BP noted to be elevated in MAU, with FHR non-reactive.  PIH labs and PCR were negative, but decision made to proceed with induction due to gestational hypertension.  History of present pregnancy: Patient entered care at 10 2/7 weeks.   EDC of 08/26/14 was established by LMP and congruent with [place] at 12 6/7 weeks..   Anatomy scan:  18 1/7 weeks, with limited view of spine and an anterior placenta.  3 fibroids noted (posterior right intramural, posterior left intramural, and anterior right subserosal--all 3.1 cm or less).   Additional [place] evaluations:   22 2/7 weeks, f/u anatomy:  Normal growth and fluid, spine seen and normal.  Profile and nasal bone not seen.   25 1/7 weeks, done due to NRNST:  Uterine septum seen, breech, BPP 8/8, AFI 17.2, 60%ile, anterior placenta, cervix long and closed. .33 1/76 weeks, due to S<D:  EFw 4+13, 2184 gm, 48%ile, AFI 18.71, 70%ile, vtx. 38 2/7 weeks:  7+12, 3521 gm, 69%ile,  AFI 13.09, 50%ile, vtx. Significant prenatal events:  PIH/PCR labs done at 18 weeks due to HA and feet swelling--all WNL.  Fibroids noted at 18 week [place], uterine septum noted at 26 1/7 weeks.  TDAP and flu vaccine at 26 1/7 weeks.  Treated for UTI at 28 weeks. [place] at 38 weeks with EFW 7+12.   Last evaluation:  08/26/14--Cervix 1 cm, 70%, vtx, -3, BP 138/70.   Patient Active Problem List   Diagnosis Date Noted  . Gestational hypertension 08/28/2014  . Elevated blood pressure affecting pregnancy in third trimester, antepartum 08/27/2014  . Uterine septum affecting pregnancy 05/29/2014  . Uterine fibroids affecting pregnancy 05/29/2014  . GBS bacteriuria 05/29/2014  . E. coli UTI dx 05/21/14 05/29/2014  . H/O 2 abortions 05/29/2014  . 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  Chief Complaint  Patient presents with  . Headache   HPI:  See above  OB History    Gravida Para Term Preterm AB TAB SAB Ectopic Multiple Living   3    2 2  0   0    2010--TAB 2011--TAB  Past Medical History  Diagnosis Date  . Uterine fibroids affecting pregnancy   . Septate uterus     History reviewed. No pertinent past surgical history.  Allergies:  Allergies  Allergen Reactions  . Coconut Oil Swelling and Rash  . Pineapple Rash   Family History: family history includes Cancer in her maternal grandfather; Diabetes in her maternal grandfather; Hypertension in her maternal grandfather. There is no history of Asthma, Birth defects, COPD, Heart disease, or Stroke.  Social History:  reports that she has never smoked. She does not have any smokeless tobacco history on file. She reports that she does not drink alcohol or use illicit drugs.  She is Sales promotion account executive, high-school educated, employed as Technical brewer.  FOB is involved and supportive, and patient's parents are also with her tonight.   Prenatal labs: ABO, Rh: --/--/O POS, O POS (09/25 0145) Antibody: NEG (09/25 0145) Rubella:   Immune RPR: Nonreactive (05/13 0000)  HBsAg: Negative (05/13 0000)  HIV: Non-reactive (05/13 0000)  GBS: Positive (11/18 0000) Sickle cell/Hgb electrophoresis:  WNL Pap:  01/30/14 ASCUS with +HR HPV--plan repeat in  12 months. GC:  Negative 01/15/14 Chlamydia:  Negative 01/15/14 Genetic screenings:  Normal 1st trimester screen and AFP Glucola:  WNL Other:   Hgb 11.4 at NOB, 10.9 03/26/14, 10 05/21/14. Initial urine culture +GBS 01/15/14, repeat 03/26/14 +GBS Urine culture 05/21/14 E coli Urine culture 05/29/14 E coli Urine culture 06/24/14 E coli PIH labs and PCR 7/22 due to swelling--WNL    Prenatal Transfer Tool  Maternal Diabetes: No Genetic Screening: Normal Maternal Ultrasounds/Referrals: Normal Fetal Ultrasounds or other Referrals:  None Maternal  Substance Abuse:  No Significant Maternal Medications:  None Significant Maternal Lab Results: Lab values include: Group B Strep positive  TDAP and Flu vaccine 05/21/14  ROS:  Temporal HA on right, mild cramping, +FM   Physical Exam   Blood pressure 142/91, pulse 106, resp. rate 16, height 5\' 5"  (1.651 m), weight 253 lb (114.76 kg).   Filed Vitals:   08/27/14 2332 08/27/14 2336 08/28/14 0001 08/28/14 0008  BP: 139/89 140/84 136/91 142/91  Pulse: 111 108  106  Resp: 16 16    Height: 5\' 5"  (1.651 m)     Weight: 253 lb (114.76 kg)        Results for orders placed or performed during the hospital encounter of 08/27/14 (from the past 24 hour(s))  Protein / creatinine ratio, urine     Status: None   Collection Time: 08/27/14 11:13 PM  Result Value Ref Range   Creatinine, Urine 247.00 mg/dL   Total Protein, Urine 19 mg/dL   Protein Creatinine Ratio 0.08 0.00 - 0.15  CBC     Status: Abnormal   Collection Time: 08/27/14 11:21 PM  Result Value Ref Range   WBC 13.0 (H) 4.0 - 10.5 K/uL   RBC 4.21 3.87 - 5.11 MIL/uL   Hemoglobin 11.1 (L) 12.0 - 15.0 g/dL   HCT 34.6 (L) 36.0 - 46.0 %   MCV 82.2 78.0 - 100.0 fL   MCH 26.4 26.0 - 34.0 pg   MCHC 32.1 30.0 - 36.0 g/dL   RDW 16.7 (H) 11.5 - 15.5 %   Platelets 253 150 - 400 K/uL  Comprehensive metabolic panel     Status: Abnormal   Collection Time: 08/27/14 11:21 PM  Result Value Ref Range   Sodium 134 (L) 135 - 145 mmol/L   Potassium 3.8 3.5 - 5.1 mmol/L   Chloride 105 96 - 112 mEq/L   CO2 22 19 - 32 mmol/L   Glucose, Bld 92 70 - 99 mg/dL   BUN 14 6 - 23 mg/dL   Creatinine, Ser 0.54 0.50 - 1.10 mg/dL   Calcium 9.2 8.4 - 10.5 mg/dL   Total Protein 7.1 6.0 - 8.3 g/dL   Albumin 3.1 (L) 3.5 - 5.2 g/dL   AST 15 0 - 37 U/L   ALT 11 0 - 35 U/L   Alkaline Phosphatase 126 (H) 39 - 117 U/L   Total Bilirubin 0.4 0.3 - 1.2 mg/dL   GFR calc non Af Amer >90 >90 mL/min   GFR calc Af Amer >90 >90 mL/min   Anion gap 7 5 - 15  Lactate  dehydrogenase     Status: None   Collection Time: 08/27/14 11:21 PM  Result Value Ref Range   LDH 116 94 - 250 U/L  Uric acid     Status: None   Collection Time: 08/27/14 11:21 PM  Result Value Ref Range   Uric Acid, Serum 5.1 2.4 - 7.0 mg/dL   Physical Exam  Chest clear Heart  RRR without murmur Abd gravid, NT Pelvic--cervix very posterior, FT/1, 50%, vtx, -2, firm Ext DTR 1+, no clonus, 1+ edema  FHR Category 2--Baseline 160-170, ST variability, but non-reactive UCs very mild, largely irritability pattern.     Assessment: IUP at 40 2/[redacted] weeks Gestational hypertension GBS positive Elevated BMI- 42.6 Non-reactive FHR with tachycardia Uterine septum Fibroids  Plan: Admit to [place] for induction per consult with Dr. Rivard--dx gestational hypertension, non-reactive FHR Routine [facility] orders [employer], then pitocin Repeat PIH labs in am. Labetalol IV prn for BP parameters. IV hydration for FHR tracing.   Donnel Saxon CNM, MSN 08/28/2014 12:32 AM

## 2014-08-29 ENCOUNTER — Encounter (HOSPITAL_COMMUNITY): Payer: Self-pay | Admitting: *Deleted

## 2014-08-29 LAB — CBC
HCT: 29.4 % — ABNORMAL LOW (ref 36.0–46.0)
Hemoglobin: 9.5 g/dL — ABNORMAL LOW (ref 12.0–15.0)
MCH: 26.4 pg (ref 26.0–34.0)
MCHC: 32.3 g/dL (ref 30.0–36.0)
MCV: 81.7 fL (ref 78.0–100.0)
Platelets: 244 10*3/uL (ref 150–400)
RBC: 3.6 MIL/uL — ABNORMAL LOW (ref 3.87–5.11)
RDW: 16.6 % — AB (ref 11.5–15.5)
WBC: 22.8 10*3/uL — ABNORMAL HIGH (ref 4.0–10.5)

## 2014-08-29 MED ORDER — DIBUCAINE 1 % RE OINT: 1.0000 "application " | TOPICAL_OINTMENT | RECTAL | Status: DC | PRN

## 2014-08-29 MED ORDER — ONDANSETRON HCL 4 MG PO TABS: 4.0000 mg | ORAL_TABLET | ORAL | Status: DC | PRN

## 2014-08-29 MED ORDER — WITCH HAZEL-GLYCERIN EX PADS: 1.0000 "application " | MEDICATED_PAD | CUTANEOUS | Status: DC | PRN

## 2014-08-29 MED ORDER — DIPHENHYDRAMINE HCL 25 MG PO CAPS: 25.0000 mg | ORAL_CAPSULE | Freq: Four times a day (QID) | ORAL | Status: DC | PRN

## 2014-08-29 MED ORDER — OXYCODONE-ACETAMINOPHEN 5-325 MG PO TABS: 1.0000 | ORAL_TABLET | ORAL | Status: DC | PRN

## 2014-08-29 MED ORDER — ONDANSETRON HCL 4 MG/2ML IJ SOLN: 4.0000 mg | INTRAMUSCULAR | Status: DC | PRN

## 2014-08-29 MED ORDER — OXYCODONE-ACETAMINOPHEN 5-325 MG PO TABS: 2.0000 | ORAL_TABLET | ORAL | Status: DC | PRN

## 2014-08-29 MED ORDER — BENZOCAINE-MENTHOL 20-0.5 % EX AERO: 1.0000 "application " | INHALATION_SPRAY | CUTANEOUS | Status: DC | PRN

## 2014-08-29 MED ORDER — SIMETHICONE 80 MG PO CHEW: 80.0000 mg | CHEWABLE_TABLET | ORAL | Status: DC | PRN

## 2014-08-29 MED ORDER — IBUPROFEN 600 MG PO TABS
600.0000 mg | ORAL_TABLET | Freq: Four times a day (QID) | ORAL | Status: DC
  Administered 2014-08-29 – 2014-08-30 (×6): 600 mg via ORAL
  Filled 2014-08-29 (×6): qty 1

## 2014-08-29 MED ORDER — LANOLIN HYDROUS EX OINT: TOPICAL_OINTMENT | CUTANEOUS | Status: DC | PRN

## 2014-08-29 MED ORDER — PRENATAL MULTIVITAMIN CH
1.0000 | ORAL_TABLET | Freq: Every day | ORAL | Status: DC
  Administered 2014-08-29: 1 via ORAL
  Filled 2014-08-29: qty 1

## 2014-08-29 MED ORDER — FERROUS SULFATE 325 (65 FE) MG PO TABS
325.0000 mg | ORAL_TABLET | Freq: Every day | ORAL | Status: DC
  Administered 2014-08-29 – 2014-08-30 (×2): 325 mg via ORAL
  Filled 2014-08-29 (×2): qty 1

## 2014-08-29 MED ORDER — SENNOSIDES-DOCUSATE SODIUM 8.6-50 MG PO TABS
2.0000 | ORAL_TABLET | ORAL | Status: DC
  Administered 2014-08-29 – 2014-08-30 (×2): 2 via ORAL
  Filled 2014-08-29 (×2): qty 2

## 2014-08-29 MED ORDER — ZOLPIDEM TARTRATE 5 MG PO TABS: 5.0000 mg | ORAL_TABLET | Freq: Every evening | ORAL | Status: DC | PRN

## 2014-08-29 MED ORDER — TETANUS-DIPHTH-ACELL PERTUSSIS 5-2.5-18.5 LF-MCG/0.5 IM SUSP: 0.5000 mL | Freq: Once | INTRAMUSCULAR | Status: DC

## 2014-08-29 NOTE — Anesthesia Postprocedure Evaluation (Signed)
  Anesthesia Post-op Note  Anesthesia Post Note  Patient: Natasha Bernard  Procedure(s) Performed: * No procedures listed *  Anesthesia type: Epidural  Patient location: Mother/Baby  Post pain: Pain level controlled  Post assessment: Post-op Vital signs reviewed  Last Vitals:  Filed Vitals:   08/29/14 0445  BP: 121/63  Pulse: 110  Temp: 36.8 C  Resp: 18    Post vital signs: Reviewed  Level of consciousness:alert  Complications: No apparent anesthesia complications

## 2014-08-29 NOTE — Lactation Note (Signed)
This note was copied from the chart of [facility]. Lactation Consultation Note Baby is 72 hours old and is still very sleepy.  I fed him 2 ml on a spoon and he woke briefly but quickly fell asleep when place in a football hold.  Repositioned him on his mother's chest.  I encouraged her to keep him skin to skin and to try and feed him in about 3 hours.  We will continue to spoon feed him until he starts to wake.  Mom to start pumping if he doesn't start eating consistently at the 24 hours mark.    Patient Name: Boy Jalayiah Bibian [id number] Date: 08/29/2014 Reason for consult: Initial assessment   Maternal Data Has patient been taught Hand Expression?: Yes Does the patient have breastfeeding experience prior to this delivery?: No  Feeding Feeding Type: Breast Milk  LATCH Score/Interventions Latch: Too sleepy or reluctant, no latch achieved, no sucking elicited.  Audible Swallowing: None  Type of Nipple: Flat Intervention(s):  (Compressible areola)  Comfort (Breast/Nipple): Soft / non-tender     Hold (Positioning): Assistance needed to correctly position infant at breast and maintain latch.  LATCH Score: 4  Lactation Tools Discussed/Used     Consult Status Consult Status: Follow-up Date: 08/30/14 Follow-up type: In-patient    Van Clines 08/29/2014, 4:00 PM

## 2014-08-29 NOTE — Progress Notes (Signed)
Subjective: Postpartum Day 1: Vaginal delivery, 1st degree vaginal laceration Patient up ad lib, reports no syncope or dizziness. Feeding:  Breast Contraceptive plan:  Mirena Plans outpatient circumcision.  Baby just had spitting episode this am--to [facility] for further observation/assessment.  Baby pink, with good HR.  Objective: Vital signs in last 24 hours: Temp:  [97.9 F (36.6 C)-98.6 F (37 C)] 98.2 F (36.8 C) (12/25 0445) Pulse Rate:  [84-124] 110 (12/25 0445) Resp:  [18-20] 18 (12/25 0445) BP: (115-150)/(45-96) 121/63 mmHg (12/25 0445) SpO2:  [97 %-99 %] 99 % (12/25 0445)  Filed Vitals:   08/28/14 2327 08/28/14 2348 08/29/14 0040 08/29/14 0445  BP: 115/92 136/78 127/64 121/63  Pulse: 119 124 119 110  Temp:  98 F (36.7 C) 98.6 F (37 C) 98.2 F (36.8 C)  TempSrc:  Oral Oral Oral  Resp: 20 20 20 18   Height:      Weight:      SpO2:  97% 98% 99%     Physical Exam:  General: alert Lochia: appropriate Uterine Fundus: firm Perineum: healing well DVT Evaluation: No evidence of DVT seen on physical exam. Negative Homan's sign.    Recent Labs  08/28/14 1620 08/29/14 0500  HGB 11.2* 9.5*  HCT 35.0* 29.4*    Assessment/Plan: Status post vaginal delivery day 1. Stable Anemia without orthostatic changes. Continue current care. Plan for discharge tomorrow  Fe daily (has been on at home). Will need renewal of PNV Rx upon d/c.    Allena Katz 08/29/2014, 7:18 AM

## 2014-08-30 LAB — RPR

## 2014-08-30 MED ORDER — IBUPROFEN 600 MG PO TABS: 600.0000 mg | ORAL_TABLET | Freq: Four times a day (QID) | ORAL | Status: DC

## 2014-08-30 MED ORDER — IBUPROFEN 600 MG PO TABS: 600.0000 mg | ORAL_TABLET | Freq: Four times a day (QID) | ORAL | Status: DC | PRN

## 2014-08-30 MED ORDER — PRENATAL MULTIVITAMIN CH: 1.0000 | ORAL_TABLET | Freq: Every day | ORAL | Status: DC

## 2014-08-30 NOTE — Discharge Instructions (Signed)
Anemia, Nonspecific Anemia is a condition in which the concentration of red blood cells or hemoglobin in the blood is below normal. Hemoglobin is a substance in red blood cells that carries oxygen to the tissues of the body. Anemia results in not enough oxygen reaching these tissues.  CAUSES  Common causes of anemia include:   Excessive bleeding. Bleeding may be internal or external. This includes excessive bleeding from periods (in women) or from the intestine.   Poor nutrition.   Chronic kidney, thyroid, and liver disease.  Bone marrow disorders that decrease red blood cell production.  Cancer and treatments for cancer.  HIV, AIDS, and their treatments.  Spleen problems that increase red blood cell destruction.  Blood disorders.  Excess destruction of red blood cells due to infection, medicines, and autoimmune disorders. SIGNS AND SYMPTOMS   Minor weakness.   Dizziness.   Headache.  Palpitations.   Shortness of breath, especially with exercise.   Paleness.  Cold sensitivity.  Indigestion.  Nausea.  Difficulty sleeping.  Difficulty concentrating. Symptoms may occur suddenly or they may develop slowly.  DIAGNOSIS  Additional blood tests are often needed. These help your health care provider determine the best treatment. Your health care provider will check your stool for blood and look for other causes of blood loss.  TREATMENT  Treatment varies depending on the cause of the anemia. Treatment can include:   Supplements of iron, vitamin H06, or folic acid.   Hormone medicines.   A blood transfusion. This may be needed if blood loss is severe.   Hospitalization. This may be needed if there is significant continual blood loss.   Dietary changes.  Spleen removal. HOME CARE INSTRUCTIONS Keep all follow-up appointments. It often takes many weeks to correct anemia, and having your health care provider check on your condition and your response to  treatment is very important. SEEK IMMEDIATE MEDICAL CARE IF:   You develop extreme weakness, shortness of breath, or chest pain.   You become dizzy or have trouble concentrating.  You develop heavy vaginal bleeding.   You develop a rash.   You have bloody or black, tarry stools.   You faint.   You vomit up blood.   You vomit repeatedly.   You have abdominal pain.  You have a fever or persistent symptoms for more than 2-3 days.   You have a fever and your symptoms suddenly get worse.   You are dehydrated.  MAKE SURE YOU:  Understand these instructions.  Will watch your condition.  Will get help right away if you are not doing well or get worse. Document Released: 09/29/2004 Document Revised: 04/24/2013 Document Reviewed: 02/15/2013 [facility] Patient Information 2015 [place], [place]. This information is not intended to replace advice given to you by your health care provider. Make sure you discuss any questions you have with your health care provider.  Postpartum Care After Vaginal Delivery After you deliver your newborn (postpartum period), the usual stay in the hospital is 24-72 hours. If there were problems with your labor or delivery, or if you have other medical problems, you might be in the hospital longer.  While you are in the hospital, you will receive help and instructions on how to care for yourself and your newborn during the postpartum period.  While you are in the hospital:  Be sure to tell your nurses if you have pain or discomfort, as well as where you feel the pain and what makes the pain worse.  If you had  an incision made near your vagina (episiotomy) or if you had some tearing during delivery, the nurses may put ice packs on your episiotomy or tear. The ice packs may help to reduce the pain and swelling.  If you are breastfeeding, you may feel uncomfortable contractions of your uterus for a couple of weeks. This is normal. The contractions help  your uterus get back to normal size.  It is normal to have some bleeding after delivery.  For the first 1-3 days after delivery, the flow is red and the amount may be similar to a period.  It is common for the flow to start and stop.  In the first few days, you may pass some small clots. Let your nurses know if you begin to pass large clots or your flow increases.  Do not  flush blood clots down the toilet before having the nurse look at them.  During the next 3-10 days after delivery, your flow should become more watery and pink or brown-tinged in color.  Ten to fourteen days after delivery, your flow should be a small amount of yellowish-white discharge.  The amount of your flow will decrease over the first few weeks after delivery. Your flow may stop in 6-8 weeks. Most women have had their flow stop by 12 weeks after delivery.  You should change your sanitary pads frequently.  Wash your hands thoroughly with soap and water for at least 20 seconds after changing pads, using the toilet, or before holding or feeding your newborn.  You should feel like you need to empty your bladder within the first 6-8 hours after delivery.  In case you become weak, lightheaded, or faint, call your nurse before you get out of bed for the first time and before you take a shower for the first time.  Within the first few days after delivery, your breasts may begin to feel tender and full. This is called engorgement. Breast tenderness usually goes away within 48-72 hours after engorgement occurs. You may also notice milk leaking from your breasts. If you are not breastfeeding, do not stimulate your breasts. Breast stimulation can make your breasts produce more milk.  Spending as much time as possible with your newborn is very important. During this time, you and your newborn can feel close and get to know each other. Having your newborn stay in your room (rooming in) will help to strengthen the bond with your  newborn. It will give you time to get to know your newborn and become comfortable caring for your newborn.  Your hormones change after delivery. Sometimes the hormone changes can temporarily cause you to feel sad or tearful. These feelings should not last more than a few days. If these feelings last longer than that, you should talk to your caregiver.  If desired, talk to your caregiver about methods of family planning or contraception.  Talk to your caregiver about immunizations. Your caregiver may want you to have the following immunizations before leaving the hospital:  Tetanus, diphtheria, and pertussis (Tdap) or tetanus and diphtheria (Td) immunization. It is very important that you and your family (including grandparents) or others caring for your newborn are up-to-date with the Tdap or Td immunizations. The Tdap or Td immunization can help protect your newborn from getting ill.  Rubella immunization.  Varicella (chickenpox) immunization.  Influenza immunization. You should receive this annual immunization if you did not receive the immunization during your pregnancy. Document Released: 06/19/2007 Document Revised: 05/16/2012 Document Reviewed: 04/18/2012 [employer]  Patient Information 2015 [place]. This information is not intended to replace advice given to you by your health care provider. Make sure you discuss any questions you have with your health care provider.

## 2014-08-30 NOTE — Lactation Note (Signed)
This note was copied from the chart of [facility]. Lactation Consultation Note Mom is going to Breast and bottle feed. Has large breast, mom demonstrated hand expression w/colostrum noted. Has semi flat nipples that easily roll in finger tips able to compress well for deep latch. Gave shells to wear in bra to evert more. Mom denies painful latches and states she doesn't have any questions or needs for LC. Reminded of [place] OP services and support groups. Patient Name: Natasha Bernard [name] Date: 08/30/2014 Reason for consult: Follow-up assessment   Maternal Data    Feeding Feeding Type: Bottle Fed - Formula  LATCH Score/Interventions                      Lactation Tools Discussed/Used Tools: Shells   Consult Status Consult Status: Complete Date: 08/30/14    Theodoro Kalata 08/30/2014, 10:09 AM

## 2014-08-30 NOTE — Discharge Summary (Signed)
  Vaginal Delivery Discharge Summary  Natasha Bernard  DOB:    [birth year] MRN:    [id number] CSN:    [id number]  Date of admission:                  08/27/14  Date of discharge:                   08/30/14  Procedures this admission:   SVB, repair of 1st degree vaginal laceration  Date of Delivery: 08/28/14  Newborn Data:  Live born female  Birth Weight: 7 lb 8.2 oz (3408 g) APGAR: 9, 9  Home with mother. Name: [facility] Circumcision Plan: Outpatient  History of Present Illness:  Natasha Bernard is a 23 y.o. female, [id number], who presents at [redacted]w[redacted]d weeks gestation. The patient has been followed at the [facility] and Gynecology division of [facility]. She was admitted induction of labor due to mildly elevated BP and non-reactive FHR. Her pregnancy has been complicated by:  Patient Active Problem List   Diagnosis Date Noted  . Gestational hypertension 08/28/2014  . SVD (spontaneous vaginal delivery) 08/28/2014  . First degree perineal laceration during delivery 08/28/2014  . Elevated blood pressure affecting pregnancy in third trimester, antepartum 08/27/2014  . Uterine septum affecting pregnancy 05/29/2014  . Uterine fibroids affecting pregnancy 05/29/2014  . GBS bacteriuria 05/29/2014  . E. coli UTI dx 05/21/14 05/29/2014  . H/O 2 abortions 05/29/2014  . ASCUS with positive high risk HPV--04/2014 05/29/2014     Hospital Course:  Admitted 08/27/14 for induction due to mildly elevated BP and non-reactive NST in MAU. Positive GBS. Progressed with pitocin and foley bulb. Utilized IV meds and epidural for pain management.  Delivery was performed by Gavin Pound, CNM, without complication. Patient and baby tolerated the procedure without difficulty, with 1st degree vaginal laceration noted. Infant status was stable and remained in room with mother.  Patient had negative PIH w/u, and BPs normalized after admission to L&D.  Mother and infant then had  an uncomplicated postpartum course, with breast feeding going well. Mom's physical exam was WNL, and she was discharged home in stable condition. Contraception plan was Mirena.  She received adequate benefit from po pain medications, using only Motrin with benefit.  Family plans outpatient circumcision.   Feeding:  breast  Contraception:  IUD  Discharge hemoglobin:  HEMOGLOBIN  Date Value Ref Range Status  08/29/2014 9.5* 12.0 - 15.0 g/dL Final   HCT  Date Value Ref Range Status  08/29/2014 29.4* 36.0 - 46.0 % Final    Discharge Physical Exam:   General: alert Lochia: appropriate Uterine Fundus: firm Incision: healing well DVT Evaluation: No evidence of DVT seen on physical exam. Negative Homan's sign.  Intrapartum Procedures: spontaneous vaginal delivery Postpartum Procedures: none Complications-Operative and Postpartum: 1st degree vaginal laceration  Discharge Diagnoses: Term Pregnancy-delivered and positive GBS, anemia without hemodynamic compromise  Discharge Information:  Activity:           pelvic rest Diet:                routine Medications: Ibuprofen and Iron (continue OTC use) Condition:      stable Instructions:     Discharge to: home     Donnel Saxon [facility] 08/30/2014 7:18 AM

## 2014-09-04 NOTE — Progress Notes (Signed)
Post discharge chart review completed.  

## 2015-03-25 ENCOUNTER — Encounter (HOSPITAL_COMMUNITY): Payer: Self-pay | Admitting: Emergency Medicine

## 2015-03-25 ENCOUNTER — Emergency Department (HOSPITAL_COMMUNITY)
Admission: EM | Admit: 2015-03-25 | Discharge: 2015-03-25 | Disposition: A | Discharge status: Home / Self Care | Payer: Medicaid Other | Attending: Emergency Medicine | Admitting: Emergency Medicine

## 2015-03-25 DIAGNOSIS — R1310 Dysphagia, unspecified: Secondary | ICD-10-CM | POA: Insufficient documentation

## 2015-03-25 DIAGNOSIS — Z91040 Latex allergy status: Secondary | ICD-10-CM | POA: Insufficient documentation

## 2015-03-25 DIAGNOSIS — R21 Rash and other nonspecific skin eruption: Secondary | ICD-10-CM | POA: Diagnosis present

## 2015-03-25 DIAGNOSIS — Z79899 Other long term (current) drug therapy: Secondary | ICD-10-CM | POA: Insufficient documentation

## 2015-03-25 DIAGNOSIS — L509 Urticaria, unspecified: Secondary | ICD-10-CM

## 2015-03-25 MED ORDER — DIPHENHYDRAMINE HCL 50 MG/ML IJ SOLN
25.0000 mg | Freq: Once | INTRAMUSCULAR | Status: AC
  Administered 2015-03-25: 25 mg via INTRAVENOUS
  Filled 2015-03-25: qty 1

## 2015-03-25 MED ORDER — FAMOTIDINE IN NACL 20-0.9 MG/50ML-% IV SOLN
20.0000 mg | Freq: Once | INTRAVENOUS | Status: AC
  Administered 2015-03-25: 20 mg via INTRAVENOUS
  Filled 2015-03-25: qty 50

## 2015-03-25 NOTE — ED Notes (Signed)
Pt states she has been taking benadryl at home and also been taking zantac  Pt states she also took her normal medication for it but it is not helping

## 2015-03-25 NOTE — ED Notes (Signed)
Pt states she has had hives since Sunday but tonight about 11 she noticed it was feeling hard to swallow like maybe her throat was swelling  Pt has no difficulty talking  No acute distress noted in triage

## 2015-03-25 NOTE — ED Provider Notes (Signed)
CSN: [id number]     Arrival date & time 03/25/15  0102 History   First MD Initiated Contact with Patient 03/25/15 0140     Chief Complaint  Patient presents with  . Allergic Reaction     (Consider location/radiation/quality/duration/timing/severity/associated sxs/prior Treatment) HPI Comments: 24 year old female with a history of urticaria presents to the emergency department for further evaluation of urticaria with difficulty swallowing. Patient states that she has been getting chronic urticaria over approximately one year. She has hives almost daily and states that they worsen in the evening. She noticed some difficulty swallowing this evening with mild lip swelling which made her nervous. She reports taking Benadryl prior to arrival with some improvement in her symptoms. She also states that she took a tablet of Zantac. She denies any difficulty swallowing at this time, but states that her hives are still present and causing her fingers to ache. She denies any shortness of breath, drooling, fever, syncope, or tongue swelling. Patient has been on multiple courses of steroids for this issue; she is no longer on chronic prednisone. She has never had to use and EpiPen for symptoms. She has f/u with [place] allergy tomorrow. She states her allergy testing came back positive, only, for dust mites.  Patient is a 24 y.o. female presenting with allergic reaction. The history is provided by the patient. No language interpreter was used.  Allergic Reaction Presenting symptoms: difficulty swallowing and rash   Presenting symptoms: no wheezing     Past Medical History  Diagnosis Date  . No pertinent past medical history   . Urticaria    Past Surgical History  Procedure Laterality Date  . No past surgeries     Family History  Problem Relation Age of Onset  . Asthma Brother   . Hypertension Maternal Grandmother   . Arthritis Maternal Grandmother   . Stroke Paternal Grandmother    History   Substance Use Topics  . Smoking status: Never Smoker   . Smokeless tobacco: Never Used  . Alcohol Use: No   OB History    Gravida Para Term Preterm AB TAB SAB Ectopic Multiple Living   2 2 2  0 0 0 0 0 0 2      Review of Systems  HENT: Positive for trouble swallowing.        +lip swelling -tongue swelling  Respiratory: Negative for shortness of breath, wheezing and stridor.   Skin: Positive for rash.  Neurological: Negative for syncope.  All other systems reviewed and are negative.   Allergies  Aspirin; Latex; and Loratadine  Home Medications   Prior to Admission medications   Medication Sig Start Date End Date Taking? Authorizing Provider  diphenhydrAMINE (BENADRYL) 25 MG tablet Take 50 mg by mouth every 6 (six) hours as needed for allergies.   Yes Historical Provider, MD  ergocalciferol (VITAMIN D2) 50000 UNITS capsule Take 1 capsule by mouth 2 (two) times a week. Burnett Sheng 02/19/15 06/19/15 Yes Historical Provider, MD  hydrOXYzine (ATARAX/VISTARIL) 25 MG tablet Take 1 tablet (25 mg total) by mouth 3 (three) times daily. 03/02/14  Yes Harden Mo, MD  ranitidine (ZANTAC) 150 MG tablet Take 1 tablet (150 mg total) by mouth 2 (two) times daily. 03/02/14  Yes Harden Mo, MD  acetaminophen (TYLENOL) 325 MG tablet Take 2 tablets (650 mg total) by mouth every 4 (four) hours as needed for mild pain (or Fever >/= 101). Patient not taking: Reported on 03/25/2015 06/16/14   Venetia Maxon Rama, MD  EPINEPHrine (EPIPEN) 0.3 mg/0.3 mL IJ SOAJ injection Inject 0.3 mLs (0.3 mg total) into the skin once. 03/02/14   Harden Mo, MD   BP 115/61 mmHg  Pulse 89  Temp(Src) 98.5 F (36.9 C) (Oral)  Resp 18  SpO2 100%  LMP 03/07/2015 (Approximate)   Physical Exam  Constitutional: She is oriented to person, place, and time. She appears well-developed and well-nourished. No distress.  HENT:  Head: Normocephalic and atraumatic.  Mouth/Throat: Oropharynx is clear and moist. No  oropharyngeal exudate.  Oropharynx clear. No angioedema. Uvula midline. Patient tolerating secretions without difficulty. No tripoding.  Eyes: Conjunctivae and EOM are normal. No scleral icterus.  Neck: Normal range of motion.  Cardiovascular: Normal rate, regular rhythm and intact distal pulses.   Pulmonary/Chest: Effort normal and breath sounds normal. No stridor. No respiratory distress. She has no wheezes. She has no rales.  Respirations even and unlabored. No stridor.  Musculoskeletal: Normal range of motion.  Neurological: She is alert and oriented to person, place, and time. She exhibits normal muscle tone. Coordination normal.  Skin: Skin is warm and dry. Rash noted. She is not diaphoretic. No erythema. No pallor.  Scattered slightly raised and erythematous macules c/w urticaria. This is noted to b/l extremities; no significant rash noted to trunk, back, or face.  Psychiatric: She has a normal mood and affect. Her behavior is normal.  Nursing note and vitals reviewed.   ED Course  Procedures (including critical care time) Labs Review Labs Reviewed - No data to display  Imaging Review No results found.   EKG Interpretation None      MDM   Final diagnoses:  Urticaria    24 year old female presents to the emergency department for further evaluation of urticaria. She reports a history of chronic urticaria and she is currently being followed by [place] allergy. Patient has no angioedema on exam. No stridor or hypoxia. She is tolerating secretions without difficulty. Patient treated in the emergency department with Pepcid and Benadryl. Patient declined SoluMedrol. She has had no worsening in her symptoms over her 3 hour observation. Do not believe further emergent workup is indicated. Patient has an appointment with her allergist in 24 hours. She has an EpiPen at home which she can use should severe symptoms of respiratory involvement or difficulty swallowing recur. Return  precautions discussed and provided. Patient agreeable to plan with no unaddressed concerns. Patient discharged in good condition.   Filed Vitals:   03/25/15 0114 03/25/15 0401  BP: 134/72 115/61  Pulse: 115 89  Temp: 98.6 F (37 C) 98.5 F (36.9 C)  TempSrc: Oral Oral  Resp: 21 18  SpO2: 100% 100%     Antonietta Breach, PA-C 03/25/15 0431  April Palumbo, MD 03/25/15 [phone]

## 2015-03-25 NOTE — Discharge Instructions (Signed)
Hives Hives are itchy, red, swollen areas of the skin. They can vary in size and location on your body. Hives can come and go for hours or several days (acute hives) or for several weeks (chronic hives). Hives do not spread from person to person (noncontagious). They may get worse with scratching, exercise, and emotional stress. CAUSES   Allergic reaction to food, additives, or drugs.  Infections, including the common cold.  Illness, such as vasculitis, lupus, or thyroid disease.  Exposure to sunlight, heat, or cold.  Exercise.  Stress.  Contact with chemicals. SYMPTOMS   Red or white swollen patches on the skin. The patches may change size, shape, and location quickly and repeatedly.  Itching.  Swelling of the hands, feet, and face. This may occur if hives develop deeper in the skin. DIAGNOSIS  Your caregiver can usually tell what is wrong by performing a physical exam. Skin or blood tests may also be done to determine the cause of your hives. In some cases, the cause cannot be determined. TREATMENT  Mild cases usually get better with medicines such as antihistamines. Severe cases may require an emergency epinephrine injection. If the cause of your hives is known, treatment includes avoiding that trigger.  HOME CARE INSTRUCTIONS   Avoid causes that trigger your hives.  Take antihistamines as directed by your caregiver to reduce the severity of your hives. Non-sedating or low-sedating antihistamines are usually recommended. Do not drive while taking an antihistamine.  Take any other medicines prescribed for itching as directed by your caregiver.  Wear loose-fitting clothing.  Keep all follow-up appointments as directed by your caregiver. SEEK MEDICAL CARE IF:   You have persistent or severe itching that is not relieved with medicine.  You have painful or swollen joints. SEEK IMMEDIATE MEDICAL CARE IF:   You have a fever.  Your tongue or lips are swollen.  You have  trouble breathing or swallowing.  You feel tightness in the throat or chest.  You have abdominal pain. These problems may be the first sign of a life-threatening allergic reaction. Call your local emergency services (911 in [place] MAKE SURE YOU:   Understand these instructions.  Will watch your condition.  Will get help right away if you are not doing well or get worse. Document Released: 08/22/2005 Document Revised: 08/27/2013 Document Reviewed: 11/15/2011 Exit[employer] Patient Information 2015 [employer]. This information is not intended to replace advice given to you by your health care provider. Make sure you discuss any questions you have with your health care provider.  

## 2016-09-28 ENCOUNTER — Ambulatory Visit (HOSPITAL_COMMUNITY)
Admission: EM | Admit: 2016-09-28 | Discharge: 2016-09-28 | Disposition: A | Discharge status: Home / Self Care | Payer: Medicaid Other | Attending: Emergency Medicine | Admitting: Emergency Medicine

## 2016-09-28 ENCOUNTER — Encounter (HOSPITAL_COMMUNITY): Payer: Self-pay | Admitting: Emergency Medicine

## 2016-09-28 DIAGNOSIS — S39012A Strain of muscle, fascia and tendon of lower back, initial encounter: Secondary | ICD-10-CM

## 2016-09-28 MED ORDER — MELOXICAM 15 MG PO TABS: 15.0000 mg | ORAL_TABLET | Freq: Every day | ORAL | 0 refills | Status: DC

## 2016-09-28 MED ORDER — CYCLOBENZAPRINE HCL 5 MG PO TABS: 5.0000 mg | ORAL_TABLET | Freq: Every evening | ORAL | 0 refills | Status: DC | PRN

## 2016-09-28 NOTE — ED Triage Notes (Signed)
Here for lower back pain onset 1.5 week... 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 NAD

## 2016-09-28 NOTE — Discharge Instructions (Signed)
You have strained a muscle in your lower back. Make sure you are using good posture when lifting. Take meloxicam daily for 1 week, then as needed for pain. Do not take ibuprofen with this medicine. Take Flexeril at bedtime for 1 week, then as needed for pain. This medicine will make you drowsy. Apply heat as often as you can. Moist heat is best. This should gradually improve over the next 1-2 weeks. Follow-up as needed.

## 2016-09-28 NOTE — ED Provider Notes (Signed)
[place]    CSN: [id number] Arrival date & time: 09/28/16  [id number]     History   Chief Complaint Chief Complaint  Patient presents with  . Back Pain    HPI Natasha Bernard is a 26 y.o. female.   HPI  She is a 26 year old woman here for evaluation of right lower back pain. 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 She states she felt a pull or pop in her right lower back. Since that time, she has had persistent discomfort in the right lower back. No radiating pain. No numbness, tingling, weakness. No bowel or bladder incontinence. No urinary symptoms. Pain is worse with rotational movement and flexion movements. She has been taking ibuprofen with no improvement. Heat does feel good.  Past Medical History:  Diagnosis Date  . Septate uterus   . Uterine fibroids affecting pregnancy     Patient Active Problem List   Diagnosis Date Noted  . Gestational hypertension 08/28/2014  . SVD (spontaneous vaginal delivery) 08/28/2014  . First degree perineal laceration during delivery 08/28/2014  . Elevated blood pressure affecting pregnancy in third trimester, antepartum 08/27/2014  . Uterine septum affecting pregnancy 05/29/2014  . Uterine fibroids affecting pregnancy 05/29/2014  . GBS bacteriuria 05/29/2014  . E. coli UTI dx 05/21/14 05/29/2014  . H/O 2 abortions 05/29/2014  . 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 No pertinent surgical history.  OB History    Gravida Para Term Preterm AB Living   3 1 1   2 1    SAB TAB Ectopic Multiple Live Births   0 2   0 1       Home Medications    Prior to Admission medications   Medication Sig Start Date End Date Taking? Authorizing Provider  cyclobenzaprine (FLEXERIL) 5 MG tablet Take 1 tablet (5 mg total) by mouth at bedtime as needed for muscle spasms. 09/28/16   Melony Overly, MD  IRON PO Take 1 tablet by mouth 2 (two) times daily.     Historical Provider, MD  meloxicam (MOBIC) 15 MG tablet Take 1 tablet (15 mg total) by mouth daily. 09/28/16   Melony Overly, MD  Prenatal Vit-Fe Fumarate-FA (PRENATAL MULTIVITAMIN) TABS tablet Take 1 tablet by mouth daily at 12 noon. 08/30/14   Donnel Saxon, CNM    Family History Family History  Problem Relation Age of Onset  . Cancer Maternal Grandfather   . Diabetes Maternal Grandfather   . Hypertension Maternal Grandfather   . Asthma Neg Hx   . Birth defects Neg Hx   . COPD Neg Hx   . Heart disease Neg Hx   . Stroke Neg Hx     Social History Social History  Substance Use Topics  . Smoking status: Never Smoker  . Smokeless tobacco: Never Used  . Alcohol use No     Allergies   Coconut oil and Pineapple   Review of Systems Review of Systems As in history of present illness  Physical Exam Triage Vital Signs ED Triage Vitals  Enc Vitals Group     BP 09/28/16 0938 123/80     Pulse Rate 09/28/16 0938 102     Resp 09/28/16 0938 16     Temp 09/28/16 0938 97.2 F (36.2 C)     Temp Source 09/28/16 0938 Oral     SpO2 09/28/16 0938 100 %  Weight --      Height --      Head Circumference --      Peak Flow --      Pain Score 09/28/16 0936 7     Pain Loc --      Pain Edu? --      Excl. in [place]? --    No data found.   Updated Vital Signs BP 123/80 (BP Location: Left Arm)   Pulse 102   Temp 97.2 F (36.2 C) (Oral)   Resp 16   LMP 09/03/2016   SpO2 100%   Breastfeeding? No   Visual Acuity Right Eye Distance:   Left Eye Distance:   Bilateral Distance:    Right Eye Near:   Left Eye Near:    Bilateral Near:     Physical Exam  Constitutional: She is oriented to person, place, and time. She appears well-developed and well-nourished. No distress.  Cardiovascular: Normal rate.   Pulmonary/Chest: Effort normal.  Musculoskeletal:  Back: No erythema or edema. No vertebral tenderness or step-offs. She is tender in the right lumbar paraspinous musculature. There  is some mild spasm. 5 out of 5 strength in bilateral lower extremities.  Neurological: She is alert and oriented to person, place, and time.     UC Treatments / Results  Labs (all labs ordered are listed, but only abnormal results are displayed) Labs Reviewed - No data to display  EKG  EKG Interpretation None       Radiology No results found.  Procedures Procedures (including critical care time)  Medications Ordered in UC Medications - No data to display   Initial Impression / Assessment and Plan / UC Course  I have reviewed the triage vital signs and the nursing notes.  Pertinent labs & imaging results that were available during my care of the patient were reviewed by me and considered in my medical decision making (see chart for details).     We'll treat with meloxicam and Flexeril for the next week. Recommended frequent application of heat. Discussed appropriate lifting technique. Expect gradual improvement over the next 2 weeks. Follow-up as needed.  Final Clinical Impressions(s) / UC Diagnoses   Final diagnoses:  Low back strain, initial encounter    New Prescriptions Discharge Medication List as of 09/28/2016 10:05 AM    START taking these medications   Details  cyclobenzaprine (FLEXERIL) 5 MG tablet Take 1 tablet (5 mg total) by mouth at bedtime as needed for muscle spasms., Starting Wed 09/28/2016, Normal    meloxicam (MOBIC) 15 MG tablet Take 1 tablet (15 mg total) by mouth daily., Starting Wed 09/28/2016, Normal         Melony Overly, MD 09/28/16 1018

## 2018-05-14 LAB — OB RESULTS CONSOLE ABO/RH: RH Type: POSITIVE

## 2018-05-14 LAB — OB RESULTS CONSOLE RUBELLA ANTIBODY, IGM: Rubella: IMMUNE

## 2018-05-14 LAB — OB RESULTS CONSOLE RPR: RPR: NONREACTIVE

## 2018-05-14 LAB — OB RESULTS CONSOLE GC/CHLAMYDIA
Chlamydia: NEGATIVE
Gonorrhea: NEGATIVE

## 2018-05-14 LAB — OB RESULTS CONSOLE HEPATITIS B SURFACE ANTIGEN: Hepatitis B Surface Ag: NEGATIVE

## 2018-05-14 LAB — OB RESULTS CONSOLE ANTIBODY SCREEN: Antibody Screen: NEGATIVE

## 2018-05-14 LAB — OB RESULTS CONSOLE HIV ANTIBODY (ROUTINE TESTING): HIV: NONREACTIVE

## 2018-07-23 ENCOUNTER — Encounter (HOSPITAL_COMMUNITY): Payer: Self-pay | Admitting: Emergency Medicine

## 2018-07-23 ENCOUNTER — Emergency Department (HOSPITAL_COMMUNITY)
Admission: EM | Admit: 2018-07-23 | Discharge: 2018-07-23 | Disposition: A | Discharge status: Home / Self Care | Payer: Self-pay | Attending: Emergency Medicine | Admitting: Emergency Medicine

## 2018-07-23 ENCOUNTER — Other Ambulatory Visit: Payer: Self-pay

## 2018-07-23 DIAGNOSIS — J029 Acute pharyngitis, unspecified: Secondary | ICD-10-CM | POA: Insufficient documentation

## 2018-07-23 DIAGNOSIS — M7918 Myalgia, other site: Secondary | ICD-10-CM | POA: Insufficient documentation

## 2018-07-23 LAB — GROUP A STREP BY PCR: Group A Strep by PCR: NOT DETECTED

## 2018-07-23 MED ORDER — DEXAMETHASONE 4 MG PO TABS
10.0000 mg | ORAL_TABLET | Freq: Once | ORAL | Status: AC
  Administered 2018-07-23: 10 mg via ORAL
  Filled 2018-07-23: qty 2

## 2018-07-23 MED ORDER — IBUPROFEN 200 MG PO TABS
400.0000 mg | ORAL_TABLET | Freq: Once | ORAL | Status: AC
  Administered 2018-07-23: 400 mg via ORAL
  Filled 2018-07-23: qty 2

## 2018-07-23 MED ORDER — ACETAMINOPHEN 325 MG PO TABS: 650.0000 mg | ORAL_TABLET | Freq: Once | ORAL | Status: DC | PRN

## 2018-07-23 NOTE — ED Provider Notes (Signed)
[place] DEPT Provider Note   CSN: [id number] Arrival date & time: 07/23/18  0106     History   Chief Complaint Chief Complaint  Patient presents with  . Sore Throat  . Fever    HPI Natasha Bernard is a 27 y.o. female.  The history is provided by the patient.  She comes in complaining of sore throat which started 2 days ago.  Initially, she only had a scratchy throat and there were associated body aches.  Over the last 24 hours, she has had chills and sweats.  Sore throat has gotten significantly worse over the last 24 hours to the point where it is very painful to swallow.  She has used some over-the-counter cough drops, but has not taken any other medication.  She denies any sick contacts.  There is been no rhinorrhea or cough and no vomiting or diarrhea.  Past Medical History:  Diagnosis Date  . Septate uterus   . Uterine fibroids affecting pregnancy     Patient Active Problem List   Diagnosis Date Noted  . Gestational hypertension 08/28/2014  . SVD (spontaneous vaginal delivery) 08/28/2014  . First degree perineal laceration during delivery 08/28/2014  . Elevated blood pressure affecting pregnancy in third trimester, antepartum 08/27/2014  . Uterine septum affecting pregnancy 05/29/2014  . Uterine fibroids affecting pregnancy 05/29/2014  . GBS bacteriuria 05/29/2014  . E. coli UTI dx 05/21/14 05/29/2014  . H/O 2 abortions 05/29/2014  . 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 No pertinent surgical history.   OB History    Gravida  3   Para  1   Term  1   Preterm      AB  2   Living  1     SAB  0   TAB  2   Ectopic      Multiple  0   Live Births  1            Home Medications    Prior to Admission medications   Medication Sig Start Date End Date Taking? Authorizing Provider  cyclobenzaprine (FLEXERIL) 5 MG tablet Take 1 tablet (5 mg total) by mouth at bedtime as needed  for muscle spasms. 09/28/16   Melony Overly, MD  IRON PO Take 1 tablet by mouth 2 (two) times daily.    [provider]  meloxicam (MOBIC) 15 MG tablet Take 1 tablet (15 mg total) by mouth daily. 09/28/16   Melony Overly, MD  Prenatal Vit-Fe Fumarate-FA (PRENATAL MULTIVITAMIN) TABS tablet Take 1 tablet by mouth daily at 12 noon. 08/30/14   Donnel Saxon, CNM    Family History Family History  Problem Relation Age of Onset  . Cancer Maternal Grandfather   . Diabetes Maternal Grandfather   . Hypertension Maternal Grandfather   . Asthma Neg Hx   . Birth defects Neg Hx   . COPD Neg Hx   . Heart disease Neg Hx   . Stroke Neg Hx     Social History Social History   Tobacco Use  . Smoking status: Never Smoker  . Smokeless tobacco: Never Used  Substance Use Topics  . Alcohol use: Yes    Comment: Social   . Drug use: No     Allergies   Coconut oil and Pineapple   Review of Systems Review of Systems  All other systems reviewed and are negative.    Physical Exam Updated Vital Signs BP Marland Kitchen)  153/94 (BP Location: Left Arm)   Pulse (!) 103   Temp (!) 102.2 F (39 C) (Oral)   Resp 18   LMP 07/07/2018   SpO2 99%   Physical Exam  Nursing note and vitals reviewed.  27 year old female, resting comfortably and in no acute distress. Vital signs are significant for fever, mildly elevated heart rate, elevated blood pressure. Oxygen saturation is 99%, which is normal. Head is normocephalic and atraumatic. PERRLA, EOMI. Oropharynx has mild tonsillar hypertrophy and mild erythema without exudate. Neck is supple.  There are tender anterior cervical and submandibular lymph nodes bilaterally. Back is nontender and there is no CVA tenderness. Lungs are clear without rales, wheezes, or rhonchi. Chest is nontender. Heart has regular rate and rhythm without murmur. Abdomen is soft, flat, nontender without masses or hepatosplenomegaly and peristalsis is normoactive. Extremities have  no cyanosis or edema, full range of motion is present. Skin is warm and dry without rash. Neurologic: Mental status is normal, cranial nerves are intact, there are no motor or sensory deficits.  ED Treatments / Results  Labs (all labs ordered are listed, but only abnormal results are displayed) Labs Reviewed  GROUP A STREP BY PCR   Procedures Procedures  Medications Ordered in ED Medications  acetaminophen (TYLENOL) tablet 650 mg (has no administration in time range)  ibuprofen (ADVIL,MOTRIN) tablet 400 mg (400 mg Oral Given 07/23/18 0200)  dexamethasone (DECADRON) tablet 10 mg (10 mg Oral Given 07/23/18 0200)     Initial Impression / Assessment and Plan / ED Course  I have reviewed the triage vital signs and the nursing notes.  Pertinent lab results that were available during my care of the patient were reviewed by me and considered in my medical decision making (see chart for details).  Fever, sore throat, body aches suggestive of viral URI.  Will check strep screen to evaluate for possibility of streptococcal infection.  She is given a dose of dexamethasone.  She had received acetaminophen for fever and is also given a dose of ibuprofen.  Old records are reviewed, and she has no relevant past visits.  Strep screen is negative.  She feels significantly better after above-noted treatment.  She is discharged with instructions to drink plenty fluids, take acetaminophen and ibuprofen as needed for fever or aching.  Return precautions discussed.  Final Clinical Impressions(s) / ED Diagnoses   Final diagnoses:  Viral pharyngitis    ED Discharge Orders    None       Delora Fuel, MD 79/48/01 [phone]

## 2018-07-23 NOTE — ED Triage Notes (Signed)
Patient presents with a sore throat that feels like she's swallowing glass and a fever. Patient's throat is red with tonsillar inflammation. Patient complaining of generalized body aches and headache.

## 2018-07-23 NOTE — Discharge Instructions (Signed)
Drink plenty of fluids.   Take ibuprofen and acetaminophen as needed for fever or aching.

## 2018-07-25 ENCOUNTER — Ambulatory Visit (INDEPENDENT_AMBULATORY_CARE_PROVIDER_SITE_OTHER): Payer: Self-pay

## 2018-07-25 ENCOUNTER — Ambulatory Visit (HOSPITAL_COMMUNITY)
Admission: EM | Admit: 2018-07-25 | Discharge: 2018-07-25 | Disposition: A | Discharge status: Home / Self Care | Payer: Self-pay | Attending: Family Medicine | Admitting: Family Medicine

## 2018-07-25 ENCOUNTER — Encounter (HOSPITAL_COMMUNITY): Payer: Self-pay | Admitting: Emergency Medicine

## 2018-07-25 DIAGNOSIS — R109 Unspecified abdominal pain: Secondary | ICD-10-CM | POA: Insufficient documentation

## 2018-07-25 DIAGNOSIS — Z8249 Family history of ischemic heart disease and other diseases of the circulatory system: Secondary | ICD-10-CM | POA: Insufficient documentation

## 2018-07-25 DIAGNOSIS — J069 Acute upper respiratory infection, unspecified: Secondary | ICD-10-CM | POA: Insufficient documentation

## 2018-07-25 DIAGNOSIS — J029 Acute pharyngitis, unspecified: Secondary | ICD-10-CM | POA: Insufficient documentation

## 2018-07-25 LAB — POCT RAPID STREP A: Streptococcus, Group A Screen (Direct): NEGATIVE

## 2018-07-25 MED ORDER — ALBUTEROL SULFATE HFA 108 (90 BASE) MCG/ACT IN AERS: 1.0000 | INHALATION_SPRAY | Freq: Four times a day (QID) | RESPIRATORY_TRACT | 0 refills | Status: AC | PRN

## 2018-07-25 MED ORDER — GUAIFENESIN ER 600 MG PO TB12: 600.0000 mg | ORAL_TABLET | Freq: Two times a day (BID) | ORAL | 0 refills | Status: DC

## 2018-07-25 NOTE — Discharge Instructions (Signed)
It was nice meeting you!!  Your x ray did not reveal any pneumonia but some trace bronchitis.  I would like for you to take mucinex for the cough and congestion.  Albuterol inhaler as needed for cough, wheezing, SOB.  Follow up as needed for continued or worsening symptoms

## 2018-07-25 NOTE — ED Triage Notes (Signed)
Pt was seen in ER Saturday for sore throat, had strep swab done which was neg, pt c/o ongoing coughing,s neezing, sore throat. Pt was given decadron on Saturday.

## 2018-07-25 NOTE — ED Provider Notes (Signed)
[place]    CSN: [id number] Arrival date & time: 07/25/18  1044     History   Chief Complaint Chief Complaint  Patient presents with  . Sore Throat  . Abdominal Pain    HPI Natasha Bernard is a 27 y.o. female.   Patient is a 27 year old female presents with continued cough, congestion, fatigue, sore throat.  This is been ongoing for approximate 5 days.  She was seen in the ER on 07/23/2018 and diagnosed with viral pharyngitis.  She has been taking ibuprofen for her symptoms with mild relief.  She reports the throat is still very sore and she has a lot of cough and congestion.  She denies any associated fever, body aches, chills.  She denies any nausea, vomiting, diarrhea.  She works in a nursing facility.   ROS per HPI    Sore Throat  Associated symptoms include abdominal pain.  Abdominal Pain    Past Medical History:  Diagnosis Date  . Septate uterus   . Uterine fibroids affecting pregnancy     Patient Active Problem List   Diagnosis Date Noted  . Gestational hypertension 08/28/2014  . SVD (spontaneous vaginal delivery) 08/28/2014  . First degree perineal laceration during delivery 08/28/2014  . Elevated blood pressure affecting pregnancy in third trimester, antepartum 08/27/2014  . Uterine septum affecting pregnancy 05/29/2014  . Uterine fibroids affecting pregnancy 05/29/2014  . GBS bacteriuria 05/29/2014  . E. coli UTI dx 05/21/14 05/29/2014  . H/O 2 abortions 05/29/2014  . 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 No pertinent surgical history.  OB History    Gravida  3   Para  1   Term  1   Preterm      AB  2   Living  1     SAB  0   TAB  2   Ectopic      Multiple  0   Live Births  1            Home Medications    Prior to Admission medications   Medication Sig Start Date End Date Taking? Authorizing Provider  albuterol (PROVENTIL HFA;VENTOLIN HFA) 108 (90 Base) MCG/ACT inhaler  Inhale 1-2 puffs into the lungs every 6 (six) hours as needed for wheezing or shortness of breath. 07/25/18   Loura Halt A, NP  guaiFENesin (MUCINEX) 600 MG 12 hr tablet Take 1 tablet (600 mg total) by mouth 2 (two) times daily. 07/25/18   Loura Halt A, NP  ibuprofen (ADVIL,MOTRIN) 200 MG tablet Take 400 mg by mouth every 6 (six) hours as needed for headache, mild pain or moderate pain.    [provider]  NUVARING 0.12-0.015 MG/24HR vaginal ring Place 1 each vaginally See admin instructions. 07/03/18   [provider]    Family History Family History  Problem Relation Age of Onset  . Cancer Maternal Grandfather   . Diabetes Maternal Grandfather   . Hypertension Maternal Grandfather   . Asthma Neg Hx   . Birth defects Neg Hx   . COPD Neg Hx   . Heart disease Neg Hx   . Stroke Neg Hx     Social History Social History   Tobacco Use  . Smoking status: Never Smoker  . Smokeless tobacco: Never Used  Substance Use Topics  . Alcohol use: Yes    Comment: Social   . Drug use: No     Allergies   Coconut oil and  Pineapple   Review of Systems Review of Systems  Gastrointestinal: Positive for abdominal pain.     Physical Exam Triage Vital Signs ED Triage Vitals  Enc Vitals Group     BP 07/25/18 1214 118/79     Pulse Rate 07/25/18 1214 80     Resp 07/25/18 1214 16     Temp 07/25/18 1214 98.4 F (36.9 C)     Temp src --      SpO2 07/25/18 1214 100 %     Weight --      Height --      Head Circumference --      Peak Flow --      Pain Score 07/25/18 1217 7     Pain Loc --      Pain Edu? --      Excl. in [place]? --    No data found.  Updated Vital Signs BP 118/79   Pulse 80   Temp 98.4 F (36.9 C)   Resp 16   LMP 07/07/2018   SpO2 100%   Visual Acuity Right Eye Distance:   Left Eye Distance:   Bilateral Distance:    Right Eye Near:   Left Eye Near:    Bilateral Near:     Physical Exam  Constitutional: She appears well-developed and  well-nourished.  Non-toxic appearance. She does not appear ill.  HENT:  Head: Normocephalic and atraumatic.  Right Ear: Hearing, tympanic membrane and ear canal normal. No drainage, swelling or tenderness. No middle ear effusion.  Left Ear: Hearing, tympanic membrane and ear canal normal. No drainage, swelling or tenderness.  No middle ear effusion.  Mouth/Throat: Uvula is midline and mucous membranes are normal. No oral lesions. No uvula swelling. Posterior oropharyngeal erythema present. No oropharyngeal exudate, posterior oropharyngeal edema or tonsillar abscesses. Tonsils are 2+ on the right. Tonsils are 2+ on the left. No tonsillar exudate.  Neck: Normal range of motion.  Cardiovascular: Normal rate, regular rhythm and normal heart sounds.  Pulmonary/Chest: Effort normal and breath sounds normal. No stridor. No respiratory distress. She has no wheezes. She has no rhonchi. She has no rales. She exhibits no tenderness.  Lymphadenopathy:    She has no cervical adenopathy.  Neurological: She is alert.  Skin: Skin is warm and dry.  Psychiatric: She has a normal mood and affect.  Nursing note and vitals reviewed.    UC Treatments / Results  Labs (all labs ordered are listed, but only abnormal results are displayed) Labs Reviewed  CULTURE, GROUP A STREP [facility])  POCT RAPID STREP A    EKG None  Radiology Dg Chest 2 View  Result Date: 07/25/2018 CLINICAL DATA:  Mildly productive cough, fatigue, and chest discomfort for the past week EXAM: CHEST - 2 VIEW COMPARISON:  None. FINDINGS: The lungs are adequately inflated. There is no focal infiltrate. There is mildly increased prominence of the infrahilar lung markings on the right. There is no pleural effusion. The heart and pulmonary vascularity are normal. The mediastinum is normal in width. The bony thorax is unremarkable. IMPRESSION: No discrete pneumonia. Bronchitic changes with right infrahilar subsegmental atelectasis. Electronically  Signed   By: David  Martinique M.D.   On: 07/25/2018 13:01    Procedures Procedures (including critical care time)  Medications Ordered in UC Medications - No data to display  Initial Impression / Assessment and Plan / UC Course  I have reviewed the triage vital signs and the nursing notes.  Pertinent labs & imaging results that  were available during my care of the patient were reviewed by me and considered in my medical decision making (see chart for details).     Patient is a 58-year female who presents with continued cough, congestion post being seen in the ER and diagnosed with viral pharyngitis. Repeat strep done here revealed negative results. Chest x-ray revealed mild bronchitic changes but no pneumonia We will have her do Mucinex and albuterol inhaler as needed for symptoms Follow up as needed for continued or worsening symptoms  Final Clinical Impressions(s) / UC Diagnoses   Final diagnoses:  Viral upper respiratory tract infection     Discharge Instructions     It was nice meeting you!!  Your x ray did not reveal any pneumonia but some trace bronchitis.  I would like for you to take mucinex for the cough and congestion.  Albuterol inhaler as needed for cough, wheezing, SOB.  Follow up as needed for continued or worsening symptoms      ED Prescriptions    Medication Sig Dispense Auth. Provider   guaiFENesin (MUCINEX) 600 MG 12 hr tablet Take 1 tablet (600 mg total) by mouth 2 (two) times daily. 15 tablet Aveena Bari A, NP   albuterol (PROVENTIL HFA;VENTOLIN HFA) 108 (90 Base) MCG/ACT inhaler Inhale 1-2 puffs into the lungs every 6 (six) hours as needed for wheezing or shortness of breath. 1 Inhaler Loura Halt A, NP     Controlled Substance Prescriptions [place] Controlled Substance Registry consulted? Not Applicable   Orvan July, NP 07/25/18 1334

## 2018-07-28 LAB — CULTURE, GROUP A STREP (THRC)

## 2018-11-03 ENCOUNTER — Encounter (HOSPITAL_COMMUNITY): Payer: Self-pay

## 2018-11-03 ENCOUNTER — Inpatient Hospital Stay (HOSPITAL_COMMUNITY): Payer: 59

## 2018-11-03 ENCOUNTER — Inpatient Hospital Stay (HOSPITAL_COMMUNITY)
Admission: AD | Admit: 2018-11-03 | Discharge: 2018-11-03 | Disposition: A | Discharge status: Home / Self Care | Payer: 59 | Source: Ambulatory Visit | Attending: Obstetrics & Gynecology | Admitting: Obstetrics & Gynecology

## 2018-11-03 DIAGNOSIS — O26893 Other specified pregnancy related conditions, third trimester: Secondary | ICD-10-CM | POA: Diagnosis not present

## 2018-11-03 DIAGNOSIS — O4103X0 Oligohydramnios, third trimester, not applicable or unspecified: Secondary | ICD-10-CM | POA: Diagnosis not present

## 2018-11-03 DIAGNOSIS — O9989 Other specified diseases and conditions complicating pregnancy, childbirth and the puerperium: Secondary | ICD-10-CM | POA: Diagnosis not present

## 2018-11-03 DIAGNOSIS — R109 Unspecified abdominal pain: Secondary | ICD-10-CM

## 2018-11-03 DIAGNOSIS — Z3A36 36 weeks gestation of pregnancy: Secondary | ICD-10-CM

## 2018-11-03 DIAGNOSIS — O99891 Other specified diseases and conditions complicating pregnancy: Secondary | ICD-10-CM

## 2018-11-03 DIAGNOSIS — M549 Dorsalgia, unspecified: Secondary | ICD-10-CM | POA: Diagnosis not present

## 2018-11-03 DIAGNOSIS — O163 Unspecified maternal hypertension, third trimester: Secondary | ICD-10-CM | POA: Insufficient documentation

## 2018-11-03 DIAGNOSIS — R1030 Lower abdominal pain, unspecified: Secondary | ICD-10-CM | POA: Diagnosis present

## 2018-11-03 LAB — CBC
HCT: 32.5 % — ABNORMAL LOW (ref 36.0–46.0)
Hemoglobin: 10.1 g/dL — ABNORMAL LOW (ref 12.0–15.0)
MCH: 24.3 pg — ABNORMAL LOW (ref 26.0–34.0)
MCHC: 31.1 g/dL (ref 30.0–36.0)
MCV: 78.1 fL — ABNORMAL LOW (ref 80.0–100.0)
Platelets: 235 10*3/uL (ref 150–400)
RBC: 4.16 MIL/uL (ref 3.87–5.11)
RDW: 15 % (ref 11.5–15.5)
WBC: 12.8 10*3/uL — ABNORMAL HIGH (ref 4.0–10.5)
nRBC: 0.2 % (ref 0.0–0.2)

## 2018-11-03 LAB — COMPREHENSIVE METABOLIC PANEL
ALT: 9 U/L (ref 0–44)
AST: 13 U/L — ABNORMAL LOW (ref 15–41)
Albumin: 2.6 g/dL — ABNORMAL LOW (ref 3.5–5.0)
Alkaline Phosphatase: 136 U/L — ABNORMAL HIGH (ref 38–126)
Anion gap: 10 (ref 5–15)
BUN: <5 mg/dL — ABNORMAL LOW (ref 6–20)
CO2: 21 mmol/L — ABNORMAL LOW (ref 22–32)
Calcium: 8.7 mg/dL — ABNORMAL LOW (ref 8.9–10.3)
Chloride: 106 mmol/L (ref 98–111)
Creatinine, Ser: 0.69 mg/dL (ref 0.44–1.00)
GFR calc Af Amer: >60 mL/min (ref >60)
GFR calc non Af Amer: >60 mL/min (ref >60)
Glucose, Bld: 82 mg/dL (ref 70–99)
Potassium: 3.9 mmol/L (ref 3.5–5.1)
Sodium: 137 mmol/L (ref 135–145)
Total Bilirubin: 0.5 mg/dL (ref 0.3–1.2)
Total Protein: 6.3 g/dL — ABNORMAL LOW (ref 6.5–8.1)

## 2018-11-03 LAB — URINALYSIS, ROUTINE W REFLEX MICROSCOPIC
Bilirubin Urine: NEGATIVE
Glucose, UA: NEGATIVE mg/dL
Ketones, ur: 80 mg/dL — AB
Leukocytes,Ua: NEGATIVE
Nitrite: NEGATIVE
Protein, ur: NEGATIVE mg/dL
Specific Gravity, Urine: 1.017 (ref 1.005–1.030)
pH: 6 (ref 5.0–8.0)

## 2018-11-03 LAB — PROTEIN / CREATININE RATIO, URINE
Creatinine, Urine: 195.01 mg/dL
Protein Creatinine Ratio: 0.08 mg/mg{Cre} (ref 0.00–0.15)
Total Protein, Urine: 16 mg/dL

## 2018-11-03 NOTE — MAU Provider Note (Signed)
Chief Complaint:  Abdominal Pain and Back Pain   First Provider Initiated Contact with Patient 11/03/18 2013      HPI: Melissa Diaz is a 28 y.o. [id number] at [redacted]w[redacted]d by LMP who presents to maternity admissions reporting constant pain in her low abdomen and low back with onset today.  She denies any hx of HTN.  She denies h/a, epigastric pain, or visual disturbances.  She has not tried any treatments. There are no other symptoms. She was told her amniotic fluid was borderline low at her recent prenatal visit and she is worried this pain is related. She reports good fetal movement.  HPI  Past Medical History: Past Medical History:  Diagnosis Date  . No pertinent past medical history   . Urticaria     Past obstetric history: OB History  Gravida Para Term Preterm AB Living  5 1 1  0 2 1  SAB TAB Ectopic Multiple Live Births  2 0 0 0 1    # Outcome Date GA Lbr Len/2nd Weight Sex Delivery Anes PTL Lv  5 Current           4 Term 02/18/13 [redacted]w[redacted]d 09:30 / 03:16 3420 g M Vag-Spont EPI  LIV  3 Gravida           2 SAB           1 SAB             Past Surgical History: Past Surgical History:  Procedure Laterality Date  . NO PAST SURGERIES      Family History: Family History  Problem Relation Age of Onset  . Asthma Brother   . Hypertension Maternal Grandmother   . Arthritis Maternal Grandmother   . Stroke Paternal Grandmother     Social History: Social History   Tobacco Use  . Smoking status: Never Smoker  . Smokeless tobacco: Never Used  Substance Use Topics  . Alcohol use: No  . Drug use: No    Allergies:  Allergies  Allergen Reactions  . Aspirin Hives    Hives and lip swelling  . Latex Hives  . Loratadine     ?  hives    Meds:  No medications prior to admission.    ROS:  Review of Systems  Constitutional: Negative for chills and fever.  Respiratory: Negative for cough and shortness of breath.   Cardiovascular: Negative for chest pain.  Gastrointestinal:  Negative for nausea and vomiting.  Genitourinary: Negative for dysuria, frequency and urgency.  Musculoskeletal: Negative.   Neurological: Negative for dizziness and headaches.    I have reviewed patient's Past Medical Hx, Surgical Hx, Family Hx, Social Hx, medications and allergies.   Physical Exam   Patient Vitals for the past 24 hrs:  BP Temp Temp src Pulse Resp SpO2 Height Weight  11/03/18 2312 128/80 98.3 F (36.8 C) -- (!) 102 18 -- -- --  11/03/18 2046 117/70 -- -- (!) 111 -- -- -- --  11/03/18 2032 121/70 -- -- 99 -- -- -- --  11/03/18 2001 125/71 -- -- 97 -- -- -- --  11/03/18 1946 (!) 143/94 -- -- (!) 120 -- -- -- --  11/03/18 1931 (!) 146/75 -- -- 94 -- -- -- --  11/03/18 1921 134/65 -- -- (!) 104 -- -- -- --  11/03/18 1916 (!) 143/74 -- -- (!) 101 -- -- -- --  11/03/18 1845 -- 97.9 F (36.6 C) Oral (!) 110 16 100 % 5\' 1"  (1.549 m)  111.3 kg   Constitutional: Well-developed, well-nourished female in no acute distress.  Cardiovascular: normal rate Respiratory: normal effort GI: Abd soft, tenderness only in region immediately below umbilicus, gravid appropriate for gestational age.  MS: Extremities nontender, no edema, normal ROM Neurologic: Alert and oriented x 4.  GU: Neg CVAT.  Dilation: Closed Effacement (%): 50 Cervical Position: Posterior Station: Ballotable Exam by:: K.Wilson,RN  FHT:  Baseline 135 , moderate variability, accelerations present, no decelerations Contractions: rare, mild to palpation   Labs: Results for orders placed or performed during the hospital encounter of 11/03/18 (from the past 24 hour(s))  Urinalysis, Routine w reflex microscopic     Status: Abnormal   Collection Time: 11/03/18  7:05 PM  Result Value Ref Range   Color, Urine YELLOW YELLOW   APPearance HAZY (A) CLEAR   Specific Gravity, Urine 1.017 1.005 - 1.030   pH 6.0 5.0 - 8.0   Glucose, UA NEGATIVE NEGATIVE mg/dL   Hgb urine dipstick SMALL (A) NEGATIVE   Bilirubin Urine  NEGATIVE NEGATIVE   Ketones, ur 80 (A) NEGATIVE mg/dL   Protein, ur NEGATIVE NEGATIVE mg/dL   Nitrite NEGATIVE NEGATIVE   Leukocytes,Ua NEGATIVE NEGATIVE   RBC / HPF 0-5 0 - 5 RBC/hpf   WBC, UA 0-5 0 - 5 WBC/hpf   Bacteria, UA RARE (A) NONE SEEN   Squamous Epithelial / LPF 0-5 0 - 5   Mucus PRESENT   Protein / creatinine ratio, urine     Status: None   Collection Time: 11/03/18  7:05 PM  Result Value Ref Range   Creatinine, Urine 195.01 mg/dL   Total Protein, Urine 16 mg/dL   Protein Creatinine Ratio 0.08 0.00 - 0.15 mg/mg[Cre]  CBC     Status: Abnormal   Collection Time: 11/03/18  8:11 PM  Result Value Ref Range   WBC 12.8 (H) 4.0 - 10.5 K/uL   RBC 4.16 3.87 - 5.11 MIL/uL   Hemoglobin 10.1 (L) 12.0 - 15.0 g/dL   HCT 79.3 (L) 96.8 - 86.4 %   MCV 78.1 (L) 80.0 - 100.0 fL   MCH 24.3 (L) 26.0 - 34.0 pg   MCHC 31.1 30.0 - 36.0 g/dL   RDW 84.7 20.7 - 21.8 %   Platelets 235 150 - 400 K/uL   nRBC 0.2 0.0 - 0.2 %  Comprehensive metabolic panel     Status: Abnormal   Collection Time: 11/03/18  8:11 PM  Result Value Ref Range   Sodium 137 135 - 145 mmol/L   Potassium 3.9 3.5 - 5.1 mmol/L   Chloride 106 98 - 111 mmol/L   CO2 21 (L) 22 - 32 mmol/L   Glucose, Bld 82 70 - 99 mg/dL   BUN <5 (L) 6 - 20 mg/dL   Creatinine, Ser 2.88 0.44 - 1.00 mg/dL   Calcium 8.7 (L) 8.9 - 10.3 mg/dL   Total Protein 6.3 (L) 6.5 - 8.1 g/dL   Albumin 2.6 (L) 3.5 - 5.0 g/dL   AST 13 (L) 15 - 41 U/L   ALT 9 0 - 44 U/L   Alkaline Phosphatase 136 (H) 38 - 126 U/L   Total Bilirubin 0.5 0.3 - 1.2 mg/dL   GFR calc non Af Amer >60 >60 mL/min   GFR calc Af Amer >60 >60 mL/min   Anion gap 10 5 - 15      Imaging:  No results found.  MAU Course/MDM: Orders Placed This Encounter  Procedures  . [place] MFM OB LIMITED  .  Urinalysis, Routine w reflex microscopic  . CBC  . Comprehensive metabolic panel  . Protein / creatinine ratio, urine  . Discharge patient    No orders of the defined types were placed in  this encounter.    NST reviewed and reactive BP initially elevated but then pt normotensive x 3-4 No s/sx of PEC. Some mild h/a intermittently since early pregnancy not requiring treatment. No h/a in MAU.  PEC labs wnl. Pt pain is in front of abdomen, not round ligament location, and is tender to touch. Pt also had low normal AFI in office recently so Limited OB US ordered US with normal AFI, no placental abnormalities Pain is likely musculoskeletal, no evidence of preterm labor F/U in office as scheduled on 11/06/18 for BP check, return to MAU for emergencies Pt discharge with strict preterm labor/preeclampsia precautions.    Assessment: 1. Back pain affecting pregnancy in third trimester   2. Abdominal pain during pregnancy, third trimester   3. Abdominal pain during pregnancy in third trimester   4. Hypertension affecting pregnancy in third trimester     Plan: Discharge home Labor precautions and fetal kick counts Follow-up Information    [facility] Follow up.   Why:  As scheduled Contact information: [address] [place] [address] [phone]        [facility] Follow up.   Why:  For signs of labor or emergencies. Contact information: [address] [place] [address] [phone]         Allergies as of 11/03/2018      Reactions   Aspirin Hives   Hives and lip swelling   Latex Hives   Loratadine    ?  hives      Medication List    TAKE these medications   acetaminophen 325 MG tablet Commonly known as:  TYLENOL Take 2 tablets (650 mg total) by mouth every 4 (four) hours as needed for mild pain (or Fever >/= 101).   EPINEPHrine 0.3 mg/0.3 mL Soaj injection Commonly known as:  EPIPEN Inject 0.3 mLs (0.3 mg total) into the skin once.   hydrOXYzine 25 MG tablet Commonly known as:  ATARAX/VISTARIL Take 1 tablet (25 mg total) by mouth 3 (three) times daily.   ranitidine 150 MG tablet Commonly known  as:  ZANTAC Take 1 tablet (150 mg total) by mouth 2 (two) times daily.       Sharen Counter Certified Nurse-Midwife 11/04/2018 4:08 AM

## 2018-11-03 NOTE — MAU Note (Signed)
Urine in lab 

## 2018-11-03 NOTE — Discharge Instructions (Signed)
Abdominal Pain During Pregnancy  Abdominal pain is common during pregnancy, and has many possible causes. Some causes are more serious than others, and sometimes the cause is not known. Abdominal pain can be a sign that labor is starting. It can also be caused by normal growth and stretching of muscles and ligaments during pregnancy. Always tell your health care provider if you have any abdominal pain. Follow these instructions at home:  Do not have sex or put anything in your vagina until your pain goes away completely.  Get plenty of rest until your pain improves.  Drink enough fluid to keep your urine pale yellow.  Take over-the-counter and prescription medicines only as told by your health care provider.  Keep all follow-up visits as told by your health care provider. This is important. Contact a health care provider if:  Your pain continues or gets worse after resting.  You have lower abdominal pain that: ? Comes and goes at regular intervals. ? Spreads to your back. ? Is similar to menstrual cramps.  You have pain or burning when you urinate. Get help right away if:  You have a fever or chills.  You have vaginal bleeding.  You are leaking fluid from your vagina.  You are passing tissue from your vagina.  You have vomiting or diarrhea that lasts for more than 24 hours.  Your baby is moving less than usual.  You feel very weak or faint.  You have shortness of breath.  You develop severe pain in your upper abdomen. Summary  Abdominal pain is common during pregnancy, and has many possible causes.  If you experience abdominal pain during pregnancy, tell your health care provider right away.  Follow your health care provider's home care instructions and keep all follow-up visits as directed. This information is not intended to replace advice given to you by your health care provider. Make sure you discuss any questions you have with your health care  provider. Document Released: 08/22/2005 Document Revised: 11/24/2016 Document Reviewed: 11/24/2016 Elsevier Interactive Patient Education  2019 [employer]   Hypertension During Pregnancy  Hypertension, commonly called high blood pressure, is when the force of blood pumping through your arteries is too strong. Arteries are blood vessels that carry blood from the heart throughout the body. Hypertension during pregnancy can cause problems for you and your baby. Your baby may be born early (prematurely) or may not weigh as much as he or she should at birth. Very bad cases of hypertension during pregnancy can be life-threatening. Different types of hypertension can occur during pregnancy. These include:  Chronic hypertension. This happens when: ? You have hypertension before pregnancy and it continues during pregnancy. ? You develop hypertension before you are [redacted] weeks pregnant, and it continues during pregnancy.  Gestational hypertension. This is hypertension that develops after the 20th week of pregnancy.  Preeclampsia, also called toxemia of pregnancy. This is a very serious type of hypertension that develops during pregnancy. It can be very dangerous for you and your baby. ? In rare cases, you may develop preeclampsia after giving birth (postpartum preeclampsia). This usually occurs within 48 hours after childbirth but may occur up to 6 weeks after giving birth. Gestational hypertension and preeclampsia usually go away within 6 weeks after your baby is born. Women who have hypertension during pregnancy have a greater chance of developing hypertension later in life or during future pregnancies. What are the causes? The exact cause of hypertension during pregnancy is not known. What increases  the risk? There are certain factors that make it more likely for you to develop hypertension during pregnancy. These include:  Having hypertension during a previous pregnancy or prior to  pregnancy.  Being overweight.  Being age 19 or older.  Being pregnant for the first time.  Being pregnant with more than one baby.  Becoming pregnant using fertilization methods such as IVF (in vitro fertilization).  Having diabetes, kidney problems, or systemic lupus erythematosus.  Having a family history of hypertension. What are the signs or symptoms? Chronic hypertension and gestational hypertension rarely cause symptoms. Preeclampsia causes symptoms, which may include:  Increased protein in your urine. Your health care provider will check for this at every visit before you give birth (prenatal visit).  Severe headaches.  Sudden weight gain.  Swelling of the hands, face, legs, and feet.  Nausea and vomiting.  Vision problems, such as blurred or double vision.  Numbness in the face, arms, legs, and feet.  Dizziness.  Slurred speech.  Sensitivity to bright lights.  Abdominal pain.  Convulsions or seizures. How is this diagnosed? You may be diagnosed with hypertension during a routine prenatal exam. At each prenatal visit, you may:  Have a urine test to check for high amounts of protein in your urine.  Have your blood pressure checked. A blood pressure reading is given as two numbers, such as "120 over 80" (or 120/80). The first ("top") number is a measure of the pressure in your arteries when your heart beats (systolic pressure). The second ("bottom") number is a measure of the pressure in your arteries as your heart relaxes between beats (diastolic pressure). Blood pressure is measured in a unit called mm Hg. For most women, a normal blood pressure reading is: ? Systolic: below 120. ? Diastolic: below 80. The type of hypertension that you are diagnosed with depends on your test results and when your symptoms developed.  Chronic hypertension is usually diagnosed before 20 weeks of pregnancy.  Gestational hypertension is usually diagnosed after 20 weeks of  pregnancy.  Hypertension with high amounts of protein in the urine is diagnosed as preeclampsia.  Blood pressure measurements that stay above 160 systolic, or above 110 diastolic, are signs of severe preeclampsia. How is this treated? Treatment for hypertension during pregnancy varies depending on the type of hypertension you have and how serious it is.  If you take medicines called ACE inhibitors to treat chronic hypertension, you may need to switch medicines. ACE inhibitors should not be taken during pregnancy.  If you have gestational hypertension, you may need to take blood pressure medicine.  If you are at risk for preeclampsia, your health care provider may recommend that you take a low-dose aspirin during your pregnancy.  If you have severe preeclampsia, you may need to be hospitalized so you and your baby can be monitored closely. You may also need to take medicine (magnesium sulfate) to prevent seizures and to lower blood pressure. This medicine may be given as an injection or through an IV.  In some cases, if your condition gets worse, you may need to deliver your baby early. Follow these instructions at home: Eating and drinking   Drink enough fluid to keep your urine pale yellow.  Avoid caffeine. Lifestyle  Do not use any products that contain nicotine or tobacco, such as cigarettes and e-cigarettes. If you need help quitting, ask your health care provider.  Do not use alcohol or drugs.  Avoid stress as much as possible. Rest and get  plenty of sleep. General instructions  Take over-the-counter and prescription medicines only as told by your health care provider.  While lying down, lie on your left side. This keeps pressure off your major blood vessels.  While sitting or lying down, raise (elevate) your feet. Try putting some pillows under your lower legs.  Exercise regularly. Ask your health care provider what kinds of exercise are best for you.  Keep all prenatal  and follow-up visits as told by your health care provider. This is important. Contact a health care provider if:  You have symptoms that your health care provider told you may require more treatment or monitoring, such as: ? Nausea or vomiting. ? Headache. Get help right away if you have:  Severe abdominal pain that does not get better with treatment.  A severe headache that does not get better.  Vomiting that does not get better.  Sudden, rapid weight gain.  Sudden swelling in your hands, ankles, or face.  Vaginal bleeding.  Blood in your urine.  Fewer movements from your baby than usual.  Blurred or double vision.  Muscle twitching or sudden muscle tightening (spasms).  Shortness of breath.  Blue fingernails or lips. Summary  Hypertension, commonly called high blood pressure, is when the force of blood pumping through your arteries is too strong.  Hypertension during pregnancy can cause problems for you and your baby.  Treatment for hypertension during pregnancy varies depending on the type of hypertension you have and how serious it is.  Get help right away if you have symptoms that your health care provider told you to watch for. This information is not intended to replace advice given to you by your health care provider. Make sure you discuss any questions you have with your health care provider. Document Released: 05/10/2011 Document Revised: 08/08/2017 Document Reviewed: 02/05/2016 Elsevier Interactive Patient Education  2019 [employer].

## 2018-11-03 NOTE — MAU Note (Signed)
Melissa Diaz is a 28 y.o. at [redacted]w[redacted]d here in MAU reporting: pain in her left side and back. Comes and goes, also feeling some ctx. Did have a vomiting episode earlier today.  Onset of complaint: 3 hours ago Pain score: 4/10 Vitals:   11/03/18 1845  Pulse: (!) 110  Resp: 16  Temp: 97.9 F (36.6 C)  SpO2: 100%     FHT:154  Lab orders placed from triage: Urinalysis

## 2018-11-03 NOTE — Progress Notes (Signed)
Lisa Leftwich-Kirby CNM in to discuss test results and d/c plan with pt. Written and verbal d/c instructions given and understanding voiced. 

## 2018-11-06 LAB — OB RESULTS CONSOLE GBS: GBS: POSITIVE

## 2018-11-19 ENCOUNTER — Encounter (HOSPITAL_COMMUNITY): Payer: Self-pay | Admitting: *Deleted

## 2018-11-19 ENCOUNTER — Telehealth (HOSPITAL_COMMUNITY): Payer: Self-pay | Admitting: *Deleted

## 2018-11-19 NOTE — Telephone Encounter (Signed)
Preadmission screen  

## 2018-11-20 ENCOUNTER — Encounter (HOSPITAL_COMMUNITY): Payer: Self-pay | Admitting: *Deleted

## 2018-11-20 ENCOUNTER — Telehealth (HOSPITAL_COMMUNITY): Payer: Self-pay | Admitting: *Deleted

## 2018-11-20 NOTE — Telephone Encounter (Signed)
Preadmission screen  

## 2018-11-23 ENCOUNTER — Other Ambulatory Visit (HOSPITAL_COMMUNITY): Payer: Self-pay | Admitting: *Deleted

## 2018-11-23 ENCOUNTER — Other Ambulatory Visit: Payer: Self-pay | Admitting: Obstetrics & Gynecology

## 2018-11-25 ENCOUNTER — Other Ambulatory Visit: Payer: Self-pay

## 2018-11-25 ENCOUNTER — Inpatient Hospital Stay (HOSPITAL_COMMUNITY): Payer: 59 | Admitting: Anesthesiology

## 2018-11-25 ENCOUNTER — Inpatient Hospital Stay (HOSPITAL_COMMUNITY): Payer: 59

## 2018-11-25 ENCOUNTER — Encounter (HOSPITAL_COMMUNITY): Payer: Self-pay | Admitting: *Deleted

## 2018-11-25 ENCOUNTER — Inpatient Hospital Stay (HOSPITAL_COMMUNITY)
Admission: AD | Admit: 2018-11-25 | Discharge: 2018-11-29 | DRG: 786 | Disposition: A | Discharge status: Home / Self Care | Payer: 59 | Attending: Obstetrics & Gynecology | Admitting: Obstetrics & Gynecology

## 2018-11-25 DIAGNOSIS — O411230 Chorioamnionitis, third trimester, not applicable or unspecified: Secondary | ICD-10-CM | POA: Diagnosis present

## 2018-11-25 DIAGNOSIS — O4103X0 Oligohydramnios, third trimester, not applicable or unspecified: Secondary | ICD-10-CM | POA: Diagnosis present

## 2018-11-25 DIAGNOSIS — O99824 Streptococcus B carrier state complicating childbirth: Secondary | ICD-10-CM | POA: Diagnosis present

## 2018-11-25 DIAGNOSIS — O9081 Anemia of the puerperium: Secondary | ICD-10-CM | POA: Diagnosis not present

## 2018-11-25 DIAGNOSIS — R51 Headache: Secondary | ICD-10-CM | POA: Diagnosis not present

## 2018-11-25 DIAGNOSIS — O26893 Other specified pregnancy related conditions, third trimester: Secondary | ICD-10-CM | POA: Diagnosis present

## 2018-11-25 DIAGNOSIS — O9089 Other complications of the puerperium, not elsewhere classified: Secondary | ICD-10-CM | POA: Diagnosis not present

## 2018-11-25 DIAGNOSIS — Z3A39 39 weeks gestation of pregnancy: Secondary | ICD-10-CM | POA: Diagnosis not present

## 2018-11-25 DIAGNOSIS — O339 Maternal care for disproportion, unspecified: Secondary | ICD-10-CM | POA: Diagnosis present

## 2018-11-25 DIAGNOSIS — O99892 Other specified diseases and conditions complicating childbirth: Secondary | ICD-10-CM | POA: Diagnosis present

## 2018-11-25 DIAGNOSIS — M542 Cervicalgia: Secondary | ICD-10-CM | POA: Diagnosis not present

## 2018-11-25 DIAGNOSIS — O9902 Anemia complicating childbirth: Secondary | ICD-10-CM | POA: Diagnosis present

## 2018-11-25 DIAGNOSIS — D62 Acute posthemorrhagic anemia: Secondary | ICD-10-CM | POA: Diagnosis not present

## 2018-11-25 DIAGNOSIS — O99214 Obesity complicating childbirth: Secondary | ICD-10-CM | POA: Diagnosis present

## 2018-11-25 DIAGNOSIS — Z880 Allergy status to penicillin: Secondary | ICD-10-CM | POA: Diagnosis not present

## 2018-11-25 DIAGNOSIS — O4100X0 Oligohydramnios, unspecified trimester, not applicable or unspecified: Secondary | ICD-10-CM | POA: Diagnosis present

## 2018-11-25 DIAGNOSIS — Z3490 Encounter for supervision of normal pregnancy, unspecified, unspecified trimester: Secondary | ICD-10-CM | POA: Diagnosis present

## 2018-11-25 HISTORY — DX: Other specified health status: Z78.9

## 2018-11-25 LAB — CBC
HCT: 33.4 % — ABNORMAL LOW (ref 36.0–46.0)
Hemoglobin: 10 g/dL — ABNORMAL LOW (ref 12.0–15.0)
MCH: 23 pg — ABNORMAL LOW (ref 26.0–34.0)
MCHC: 29.9 g/dL — ABNORMAL LOW (ref 30.0–36.0)
MCV: 77 fL — ABNORMAL LOW (ref 80.0–100.0)
Platelets: 231 10*3/uL (ref 150–400)
RBC: 4.34 MIL/uL (ref 3.87–5.11)
RDW: 15.9 % — ABNORMAL HIGH (ref 11.5–15.5)
WBC: 12.7 10*3/uL — ABNORMAL HIGH (ref 4.0–10.5)
nRBC: 0.2 % (ref 0.0–0.2)

## 2018-11-25 LAB — ABO/RH: ABO/RH(D): O POS

## 2018-11-25 MED ORDER — LACTATED RINGERS IV SOLN
500.0000 mL | Freq: Once | INTRAVENOUS | Status: AC
  Administered 2018-11-25: 500 mL via INTRAVENOUS

## 2018-11-25 MED ORDER — SODIUM CHLORIDE (PF) 0.9 % IJ SOLN
INTRAMUSCULAR | Status: DC | PRN
  Administered 2018-11-25: 12 mL/h via EPIDURAL

## 2018-11-25 MED ORDER — OXYTOCIN 40 UNITS IN NORMAL SALINE INFUSION - SIMPLE MED
2.5000 [IU]/h | INTRAVENOUS | Status: DC
  Filled 2018-11-25: qty 1000

## 2018-11-25 MED ORDER — PHENYLEPHRINE 40 MCG/ML (10ML) SYRINGE FOR IV PUSH (FOR BLOOD PRESSURE SUPPORT)
80.0000 ug | PREFILLED_SYRINGE | INTRAVENOUS | Status: DC | PRN
  Filled 2018-11-25: qty 10

## 2018-11-25 MED ORDER — VANCOMYCIN HCL IN DEXTROSE 1-5 GM/200ML-% IV SOLN
1000.0000 mg | Freq: Two times a day (BID) | INTRAVENOUS | Status: DC
  Administered 2018-11-25 – 2018-11-26 (×2): 1000 mg via INTRAVENOUS
  Filled 2018-11-25 (×2): qty 200

## 2018-11-25 MED ORDER — ONDANSETRON HCL 4 MG/2ML IJ SOLN: 4.0000 mg | Freq: Four times a day (QID) | INTRAMUSCULAR | Status: DC | PRN

## 2018-11-25 MED ORDER — PHENYLEPHRINE 40 MCG/ML (10ML) SYRINGE FOR IV PUSH (FOR BLOOD PRESSURE SUPPORT)
80.0000 ug | PREFILLED_SYRINGE | INTRAVENOUS | Status: DC | PRN
  Administered 2018-11-25: 80 ug via INTRAVENOUS

## 2018-11-25 MED ORDER — LACTATED RINGERS IV SOLN
500.0000 mL | INTRAVENOUS | Status: DC | PRN
  Administered 2018-11-25 (×2): 500 mL via INTRAVENOUS

## 2018-11-25 MED ORDER — LIDOCAINE HCL (PF) 1 % IJ SOLN
INTRAMUSCULAR | Status: DC | PRN
  Administered 2018-11-25: 6 mL via EPIDURAL

## 2018-11-25 MED ORDER — DIPHENHYDRAMINE HCL 50 MG/ML IJ SOLN: 12.5000 mg | INTRAMUSCULAR | Status: DC | PRN

## 2018-11-25 MED ORDER — EPHEDRINE 5 MG/ML INJ
10.0000 mg | INTRAVENOUS | Status: AC | PRN
  Administered 2018-11-25 (×2): 10 mg via INTRAVENOUS

## 2018-11-25 MED ORDER — GENTAMICIN SULFATE 40 MG/ML IJ SOLN
5.0000 mg/kg | Freq: Once | INTRAVENOUS | Status: AC
  Administered 2018-11-25: 370 mg via INTRAVENOUS
  Filled 2018-11-25: qty 9.25

## 2018-11-25 MED ORDER — LACTATED RINGERS IV SOLN
INTRAVENOUS | Status: DC
  Administered 2018-11-25 – 2018-11-26 (×5): via INTRAVENOUS

## 2018-11-25 MED ORDER — ACETAMINOPHEN 325 MG PO TABS
650.0000 mg | ORAL_TABLET | ORAL | Status: DC | PRN
  Administered 2018-11-25: 650 mg via ORAL
  Filled 2018-11-25: qty 2

## 2018-11-25 MED ORDER — SOD CITRATE-CITRIC ACID 500-334 MG/5ML PO SOLN
30.0000 mL | ORAL | Status: DC | PRN
  Administered 2018-11-26: 30 mL via ORAL
  Filled 2018-11-25: qty 15

## 2018-11-25 MED ORDER — TERBUTALINE SULFATE 1 MG/ML IJ SOLN: 0.2500 mg | Freq: Once | INTRAMUSCULAR | Status: DC | PRN

## 2018-11-25 MED ORDER — LIDOCAINE-EPINEPHRINE (PF) 2 %-1:200000 IJ SOLN
INTRAMUSCULAR | Status: DC | PRN
  Administered 2018-11-25: 5 mL via EPIDURAL
  Administered 2018-11-26: 3 mL via EPIDURAL
  Administered 2018-11-26: 5 mL via EPIDURAL
  Administered 2018-11-26: 2 mL via EPIDURAL
  Administered 2018-11-26: 5 mL via EPIDURAL

## 2018-11-25 MED ORDER — OXYCODONE-ACETAMINOPHEN 5-325 MG PO TABS: 2.0000 | ORAL_TABLET | ORAL | Status: DC | PRN

## 2018-11-25 MED ORDER — OXYTOCIN BOLUS FROM INFUSION: 500.0000 mL | Freq: Once | INTRAVENOUS | Status: DC

## 2018-11-25 MED ORDER — FENTANYL CITRATE (PF) 100 MCG/2ML IJ SOLN
INTRAMUSCULAR | Status: AC
  Administered 2018-11-25: 100 ug via EPIDURAL
  Filled 2018-11-25: qty 2

## 2018-11-25 MED ORDER — OXYTOCIN 40 UNITS IN NORMAL SALINE INFUSION - SIMPLE MED
1.0000 m[IU]/min | INTRAVENOUS | Status: DC
  Administered 2018-11-25: 2 m[IU]/min via INTRAVENOUS

## 2018-11-25 MED ORDER — EPHEDRINE 5 MG/ML INJ
10.0000 mg | INTRAVENOUS | Status: DC | PRN
  Filled 2018-11-25 (×2): qty 10

## 2018-11-25 MED ORDER — LIDOCAINE HCL (PF) 1 % IJ SOLN: 30.0000 mL | INTRAMUSCULAR | Status: DC | PRN

## 2018-11-25 MED ORDER — FENTANYL CITRATE (PF) 100 MCG/2ML IJ SOLN
INTRAMUSCULAR | Status: DC | PRN
  Administered 2018-11-25 – 2018-11-26 (×2): 100 ug via EPIDURAL
  Administered 2018-11-26: 15 ug via INTRATHECAL

## 2018-11-25 MED ORDER — MISOPROSTOL 25 MCG QUARTER TABLET
25.0000 ug | ORAL_TABLET | ORAL | Status: DC | PRN
  Administered 2018-11-25: 25 ug via VAGINAL

## 2018-11-25 MED ORDER — OXYCODONE-ACETAMINOPHEN 5-325 MG PO TABS: 1.0000 | ORAL_TABLET | ORAL | Status: DC | PRN

## 2018-11-25 MED ORDER — FENTANYL-BUPIVACAINE-NACL 0.5-0.125-0.9 MG/250ML-% EP SOLN
12.0000 mL/h | EPIDURAL | Status: DC | PRN
  Filled 2018-11-25: qty 250

## 2018-11-25 MED ORDER — FENTANYL CITRATE (PF) 100 MCG/2ML IJ SOLN
100.0000 ug | Freq: Once | INTRAMUSCULAR | Status: AC
  Administered 2018-11-25: 100 ug via EPIDURAL

## 2018-11-25 MED ORDER — MISOPROSTOL 25 MCG QUARTER TABLET
ORAL_TABLET | ORAL | Status: AC
  Filled 2018-11-25: qty 1

## 2018-11-25 NOTE — Anesthesia Procedure Notes (Addendum)
Epidural Patient location during procedure: OB Start time: 11/25/2018 4:36 PM End time: 11/25/2018 4:40 PM  Staffing Anesthesiologist: Bethena Midget, MD  Preanesthetic Checklist Completed: patient identified, site marked, surgical consent, pre-op evaluation, timeout performed, IV checked, risks and benefits discussed and monitors and equipment checked  Epidural Patient position: sitting Prep: site prepped and draped and DuraPrep Patient monitoring: continuous pulse ox and blood pressure Approach: midline Location: L3-L4 Injection technique: LOR air  Needle:  Needle type: Tuohy  Needle gauge: 17 G Needle length: 9 cm and 9 Needle insertion depth: 9 cm Catheter type: closed end flexible Catheter size: 19 Gauge Catheter at skin depth: 14 cm Test dose: negative  Assessment Events: blood not aspirated, injection not painful, no injection resistance, negative IV test and no paresthesia

## 2018-11-25 NOTE — H&P (Signed)
Melissa Diaz is a 28 y.o. female presenting for IOL at 39 wks for dates and favorable cx.  [id number]. G1- VAVD (6'8"), G2- SVD (7'3"), G3- TAB, G4- SAB Pregnancy complicated by 2nd trim spotting. 3rd trim preterm contractions and a lot of discomfort in pelvis Growth sono at 28, 36 wks for maternal body habitus. Last sono  3/18-  7'11" 77%, AC 89% AFI 11 cm. Vx.    OB History    Gravida  5   Para  1   Term  1   Preterm  0   AB  2   Living  2     SAB  2   TAB  0   Ectopic  0   Multiple  0   Live Births  2          Past Medical History:  Diagnosis Date  . Medical history non-contributory   . No pertinent past medical history   . Urticaria    Past Surgical History:  Procedure Laterality Date  . NO PAST SURGERIES     Family History: family history includes Arthritis in her maternal grandmother; Asthma in her brother; Hypertension in her maternal grandmother; Stroke in her paternal grandmother. Social History:  reports that she has never smoked. She has never used smokeless tobacco. She reports that she does not drink alcohol or use drugs.     Maternal Diabetes: No Genetic Screening: Normal Maternal Ultrasounds/Referrals: Normal Fetal Ultrasounds or other Referrals:  None Maternal Substance Abuse:  No Significant Maternal Medications:  None Significant Maternal Lab Results:  Lab values include: Group B Strep positive, clindamycin resistance, will use vancomycin in labor  Other Comments:  None  ROS neg  History Dilation: Fingertip Effacement (%): Thick Station: Ballotable Exam by:: Verdie Drown, RN Blood pressure (!) 138/96, pulse (!) 122, temperature 98.4 F (36.9 C), temperature source Oral, resp. rate 16, height 5\' 1"  (1.549 m), weight 113.4 kg. Exam Physical Exam  Physical exam:  A&O x 3, no acute distress. Pleasant HEENT neg, no thyromegaly Lungs CTA bilat CV RRR, S1S2 normal Abdo soft, non tender, non acute Extr no edema/ tenderness Pelvic per  RN FHT 130s + accels no decels mod variab- cat I Toco none  Prenatal labs: ABO, Rh: O/Positive/-- (09/09 0000) Antibody: Negative (09/09 0000) Rubella: Immune (09/09 0000) RPR: Nonreactive (09/09 0000)  HBsAg: Negative (09/09 0000)  HIV: Non-reactive (09/09 0000)  GBS: Positive (03/03 0000)  QUAD neg GLucola nl  Assessment/Plan: 28 yo G5P2022. 39 wks, Pitocin IOL. Foley bulb when possible. Epidural planned. Anticipate SVD GBS(+), PCN allergy, Clindamycin resistance. Vancomycin in labor.     [name] 11/25/2018, 10:12 AM

## 2018-11-25 NOTE — Progress Notes (Signed)
Patient ID: Melissa Diaz, female   DOB: [birth year], 28 y.o.   MRN: [id number] FHT 150s, mod variability, repetitive late decels but variability maintained, continue to watch closely

## 2018-11-25 NOTE — Anesthesia Preprocedure Evaluation (Signed)
Anesthesia Evaluation  Patient identified by MRN, date of birth, ID band Patient awake    Reviewed: Allergy & Precautions, H&P , NPO status , Patient's Chart, lab work & pertinent test results, reviewed documented beta blocker date and time   Airway Mallampati: I  TM Distance: >3 FB Neck ROM: full    Dental no notable dental hx. (+) Teeth Intact   Pulmonary neg pulmonary ROS,    Pulmonary exam normal breath sounds clear to auscultation       Cardiovascular negative cardio ROS Normal cardiovascular exam Rhythm:regular Rate:Normal     Neuro/Psych negative neurological ROS  negative psych ROS   GI/Hepatic negative GI ROS, Neg liver ROS,   Endo/Other  Morbid obesity  Renal/GU negative Renal ROS  negative genitourinary   Musculoskeletal   Abdominal (+) + obese,   Peds  Hematology negative hematology ROS (+)   Anesthesia Other Findings   Reproductive/Obstetrics (+) Pregnancy                             Anesthesia Physical Anesthesia Plan  ASA: III  Anesthesia Plan: Epidural   Post-op Pain Management:    Induction:   PONV Risk Score and Plan:   Airway Management Planned:   Additional Equipment:   Intra-op Plan:   Post-operative Plan:   Informed Consent: I have reviewed the patients History and Physical, chart, labs and discussed the procedure including the risks, benefits and alternatives for the proposed anesthesia with the patient or authorized representative who has indicated his/her understanding and acceptance.       Plan Discussed with:   Anesthesia Plan Comments:         Anesthesia Quick Evaluation

## 2018-11-25 NOTE — Progress Notes (Signed)
Patient comfortable with intermittent contractions   Objective: Comfortable Cervix: 1 cm, 20% effaced, soft, posterior, membranes intact, vertex Lower extremity: Trace edema, nontender NST: 140s, positive accelerations, no decelerations, 10 beat variability Toco: None  CBC    Component Value Date/Time   WBC 12.7 (H) 11/25/2018 0901   RBC 4.34 11/25/2018 0901   HGB 10.0 (L) 11/25/2018 0901   HCT 33.4 (L) 11/25/2018 0901   PLT 231 11/25/2018 0901   MCV 77.0 (L) 11/25/2018 0901   MCH 23.0 (L) 11/25/2018 0901   MCHC 29.9 (L) 11/25/2018 0901   RDW 15.9 (H) 11/25/2018 0901   LYMPHSABS 1.5 06/13/2014 0820   MONOABS 1.0 06/13/2014 0820   EOSABS 0.1 06/13/2014 0820   BASOSABS 0.0 06/13/2014 0820    Assessment and plan: 28 year old G5, P2 at 39 weeks for induction of labor, elective, low AFI GBS positive, penicillin allergy.  Meds have not yet been started but encouraged nurse to start vancomycin now Induction of labor: Cervical Foley bulb placed, Cytotec given, will consider AROM and Pitocin once antibiotics given. Reactive fetal testing

## 2018-11-25 NOTE — Progress Notes (Addendum)
Melissa Diaz is a 28 y.o. [id number] at [redacted]w[redacted]d by LMP c/w sono. IOL for dates Subjective: Epidural is good  Objective: BP (!) 113/53   Pulse (!) 110   Temp 98.2 F (36.8 C) (Oral)   Resp 16   Ht 5\' 1"  (1.549 m)   Wt 113.4 kg   SpO2 100%   BMI 47.24 kg/m  No intake/output data recorded. No intake/output data recorded.  FHT:  FHR: 140s bpm, variability: moderate,  accelerations:  Present,  decelerations:  Absent UC:   regular, every 2 minutes SVE:   Dilation: 5 Effacement (%): 50 Station: -3 Exam by:: Melissa Diaz AROM, clear fluid. High station above inlet   Labs: Lab Results  Component Value Date   WBC 12.7 (H) 11/25/2018   HGB 10.0 (L) 11/25/2018   HCT 33.4 (L) 11/25/2018   MCV 77.0 (L) 11/25/2018   PLT 231 11/25/2018    Assessment / Plan: Induction of labor due to term with favorable cervix,  progressing well on pitocin but still latent labor. UCs adequate pattern, titrate pitocin down if needed since AROM  Fetal Wellbeing:  Category I Pain Control:  Epidural I/D:  Vancomycin Anticipated MOD:  NSVD planned but unengaged head at present. EFW 7'11" by sono last wk Prior SVD 7'6"   Melissa Diaz 11/25/2018, 6:11 PM

## 2018-11-25 NOTE — Addendum Note (Signed)
Addended by: MODYSusa Day on: 11/25/2018 10:51 AM   Modules accepted: Orders

## 2018-11-25 NOTE — Progress Notes (Signed)
Patient ID: Melissa Diaz, female   DOB: [birth year], 28 y.o.   MRN: [id number] Feeling cervical vaginal pain with contractions. Epidural not working. Maternal temp 101. Chorioamnionitis- Spoke with pharmacist, will add wt based single dose Gentamicin and continue vancomycin UCs adequate  FHT 160s + accels no decels mod variab- baseline change noted Cx 6/ 70% effaced but head above inlet, unengaged  Anesthesia to assess if pain relief possible  Do not increase pitocin (down to 12 from 14) to avoid obstructed labor pattern with risk of uterine rupture Proceed with C-section if head continues to stay above inlet   Pt voiced understanding   V.Damond Borchers MD

## 2018-11-25 NOTE — Progress Notes (Signed)
Patient ID: Melissa Diaz, female   DOB: [birth year], 28 y.o.   MRN: [id number] IOL at 39 wks for dates and multiparity, Prior 2 SVDs, 2nd larger at 7'8" rapid delivery.  Maternal obesity but low wt gain in pregnancy. Spontaneous version from Breech to cephalic at 34 wks.  IOL with Cytotec x 1 and cervical foley. Pitocin now. AROM at 6 pm.  Cx still 5cm/ 50%/ Vx above the inlet, -5, ballotable. Vx to left corner -almost oblique in right lateral position but aligns well if supine with left tilt, positioned to favor keeping baby longitudinal.  IUPC placed.  Continue to assess if Vx engages, suspected EFW 7'11" by sono last wk, unless EFW error large. Marland Kitchen  FHT still cat I, slight baseline change from 140s to 150s, with moderate variability and + accels, no decels in this position.   Pt and husband counseled and they voice understanding.  V.Aziya Arena, MD

## 2018-11-26 ENCOUNTER — Encounter (HOSPITAL_COMMUNITY)
Admission: AD | Disposition: A | Discharge status: Home / Self Care | Payer: Self-pay | Source: Home / Self Care | Attending: Obstetrics & Gynecology

## 2018-11-26 DIAGNOSIS — O99892 Other specified diseases and conditions complicating childbirth: Secondary | ICD-10-CM | POA: Diagnosis present

## 2018-11-26 LAB — CBC
HCT: 30.7 % — ABNORMAL LOW (ref 36.0–46.0)
Hemoglobin: 9.3 g/dL — ABNORMAL LOW (ref 12.0–15.0)
MCH: 23.2 pg — ABNORMAL LOW (ref 26.0–34.0)
MCHC: 30.3 g/dL (ref 30.0–36.0)
MCV: 76.6 fL — ABNORMAL LOW (ref 80.0–100.0)
Platelets: 242 10*3/uL (ref 150–400)
RBC: 4.01 MIL/uL (ref 3.87–5.11)
RDW: 15.8 % — ABNORMAL HIGH (ref 11.5–15.5)
WBC: 23.7 10*3/uL — ABNORMAL HIGH (ref 4.0–10.5)
nRBC: 0 % (ref 0.0–0.2)

## 2018-11-26 LAB — CREATININE, SERUM
Creatinine, Ser: 0.93 mg/dL (ref 0.44–1.00)
GFR calc Af Amer: >60 mL/min (ref >60)
GFR calc non Af Amer: >60 mL/min (ref >60)

## 2018-11-26 LAB — TYPE AND SCREEN
ABO/RH(D): O POS
Antibody Screen: NEGATIVE

## 2018-11-26 LAB — RPR: RPR Ser Ql: NONREACTIVE

## 2018-11-26 SURGERY — Surgical Case
Anesthesia: Epidural

## 2018-11-26 MED ORDER — NALBUPHINE HCL 10 MG/ML IJ SOLN: 5.0000 mg | Freq: Once | INTRAMUSCULAR | Status: DC | PRN

## 2018-11-26 MED ORDER — SODIUM CHLORIDE 0.9% FLUSH: 3.0000 mL | INTRAVENOUS | Status: DC | PRN

## 2018-11-26 MED ORDER — ONDANSETRON HCL 4 MG/2ML IJ SOLN
INTRAMUSCULAR | Status: DC | PRN
  Administered 2018-11-26: 4 mg via INTRAVENOUS

## 2018-11-26 MED ORDER — ONDANSETRON HCL 4 MG/2ML IJ SOLN
4.0000 mg | Freq: Three times a day (TID) | INTRAMUSCULAR | Status: DC | PRN
  Administered 2018-11-26: 4 mg via INTRAVENOUS
  Filled 2018-11-26: qty 2

## 2018-11-26 MED ORDER — WITCH HAZEL-GLYCERIN EX PADS: 1.0000 "application " | MEDICATED_PAD | CUTANEOUS | Status: DC | PRN

## 2018-11-26 MED ORDER — ZOLPIDEM TARTRATE 5 MG PO TABS: 5.0000 mg | ORAL_TABLET | Freq: Every evening | ORAL | Status: DC | PRN

## 2018-11-26 MED ORDER — GENTAMICIN SULFATE 40 MG/ML IJ SOLN
5.0000 mg/kg | INTRAVENOUS | Status: DC
  Filled 2018-11-26: qty 9.25

## 2018-11-26 MED ORDER — SODIUM CHLORIDE 0.9 % IR SOLN
Status: DC | PRN
  Administered 2018-11-26: 1000 mL

## 2018-11-26 MED ORDER — ACETAMINOPHEN 10 MG/ML IV SOLN
1000.0000 mg | Freq: Once | INTRAVENOUS | Status: AC
  Administered 2018-11-26: 1000 mg via INTRAVENOUS

## 2018-11-26 MED ORDER — LIDOCAINE-EPINEPHRINE (PF) 2 %-1:200000 IJ SOLN
INTRAMUSCULAR | Status: AC
  Filled 2018-11-26: qty 10

## 2018-11-26 MED ORDER — SCOPOLAMINE 1 MG/3DAYS TD PT72
MEDICATED_PATCH | TRANSDERMAL | Status: AC
  Filled 2018-11-26: qty 1

## 2018-11-26 MED ORDER — COCONUT OIL OIL
1.0000 "application " | TOPICAL_OIL | Status: DC | PRN
  Administered 2018-11-28: 1 via TOPICAL

## 2018-11-26 MED ORDER — TETANUS-DIPHTH-ACELL PERTUSSIS 5-2.5-18.5 LF-MCG/0.5 IM SUSP: 0.5000 mL | Freq: Once | INTRAMUSCULAR | Status: DC

## 2018-11-26 MED ORDER — SCOPOLAMINE 1 MG/3DAYS TD PT72
MEDICATED_PATCH | TRANSDERMAL | Status: DC | PRN
  Administered 2018-11-26: 1 via TRANSDERMAL

## 2018-11-26 MED ORDER — MORPHINE SULFATE (PF) 0.5 MG/ML IJ SOLN
INTRAMUSCULAR | Status: DC | PRN
  Administered 2018-11-26: .15 mg via INTRATHECAL

## 2018-11-26 MED ORDER — DIBUCAINE 1 % RE OINT: 1.0000 "application " | TOPICAL_OINTMENT | RECTAL | Status: DC | PRN

## 2018-11-26 MED ORDER — LACTATED RINGERS IV SOLN
INTRAVENOUS | Status: DC | PRN
  Administered 2018-11-26 (×3): via INTRAVENOUS

## 2018-11-26 MED ORDER — FENTANYL CITRATE (PF) 100 MCG/2ML IJ SOLN
INTRAMUSCULAR | Status: AC
  Filled 2018-11-26: qty 2

## 2018-11-26 MED ORDER — OXYTOCIN 10 UNIT/ML IJ SOLN
INTRAVENOUS | Status: DC | PRN
  Administered 2018-11-26: 06:00:00 via INTRAVENOUS

## 2018-11-26 MED ORDER — KETOROLAC TROMETHAMINE 30 MG/ML IJ SOLN: 30.0000 mg | Freq: Four times a day (QID) | INTRAMUSCULAR | Status: DC | PRN

## 2018-11-26 MED ORDER — PHENYLEPHRINE HCL 10 MG/ML IJ SOLN
INTRAMUSCULAR | Status: DC | PRN
  Administered 2018-11-26: 80 ug via INTRAVENOUS
  Administered 2018-11-26 (×6): 40 ug via INTRAVENOUS
  Administered 2018-11-26: 80 ug via INTRAVENOUS
  Administered 2018-11-26: 40 ug via INTRAVENOUS

## 2018-11-26 MED ORDER — OXYTOCIN 40 UNITS IN NORMAL SALINE INFUSION - SIMPLE MED
INTRAVENOUS | Status: AC
  Filled 2018-11-26: qty 1000

## 2018-11-26 MED ORDER — DEXAMETHASONE SODIUM PHOSPHATE 4 MG/ML IJ SOLN
INTRAMUSCULAR | Status: AC
  Filled 2018-11-26: qty 1

## 2018-11-26 MED ORDER — SIMETHICONE 80 MG PO CHEW
80.0000 mg | CHEWABLE_TABLET | ORAL | Status: DC
  Administered 2018-11-26 – 2018-11-28 (×3): 80 mg via ORAL
  Filled 2018-11-26 (×3): qty 1

## 2018-11-26 MED ORDER — MENTHOL 3 MG MT LOZG: 1.0000 | LOZENGE | OROMUCOSAL | Status: DC | PRN

## 2018-11-26 MED ORDER — SCOPOLAMINE 1 MG/3DAYS TD PT72: 1.0000 | MEDICATED_PATCH | Freq: Once | TRANSDERMAL | Status: DC

## 2018-11-26 MED ORDER — OXYTOCIN 40 UNITS IN NORMAL SALINE INFUSION - SIMPLE MED: 2.5000 [IU]/h | INTRAVENOUS | Status: AC

## 2018-11-26 MED ORDER — ACETAMINOPHEN 10 MG/ML IV SOLN
INTRAVENOUS | Status: AC
  Filled 2018-11-26: qty 100

## 2018-11-26 MED ORDER — ENOXAPARIN SODIUM 60 MG/0.6ML ~~LOC~~ SOLN
55.0000 mg | SUBCUTANEOUS | Status: DC
  Administered 2018-11-27 – 2018-11-28 (×2): 55 mg via SUBCUTANEOUS
  Filled 2018-11-26 (×2): qty 0.6

## 2018-11-26 MED ORDER — SIMETHICONE 80 MG PO CHEW: 80.0000 mg | CHEWABLE_TABLET | ORAL | Status: DC | PRN

## 2018-11-26 MED ORDER — LACTATED RINGERS IV SOLN: INTRAVENOUS | Status: DC

## 2018-11-26 MED ORDER — DIPHENHYDRAMINE HCL 25 MG PO CAPS: 25.0000 mg | ORAL_CAPSULE | ORAL | Status: DC | PRN

## 2018-11-26 MED ORDER — SIMETHICONE 80 MG PO CHEW
80.0000 mg | CHEWABLE_TABLET | Freq: Three times a day (TID) | ORAL | Status: DC
  Administered 2018-11-26 – 2018-11-29 (×9): 80 mg via ORAL
  Filled 2018-11-26 (×10): qty 1

## 2018-11-26 MED ORDER — PRENATAL MULTIVITAMIN CH
1.0000 | ORAL_TABLET | Freq: Every day | ORAL | Status: DC
  Administered 2018-11-27 – 2018-11-28 (×2): 1 via ORAL
  Filled 2018-11-26 (×2): qty 1

## 2018-11-26 MED ORDER — MEPERIDINE HCL 25 MG/ML IJ SOLN: 6.2500 mg | INTRAMUSCULAR | Status: DC | PRN

## 2018-11-26 MED ORDER — ACETAMINOPHEN 325 MG PO TABS
650.0000 mg | ORAL_TABLET | ORAL | Status: DC | PRN
  Administered 2018-11-26 – 2018-11-29 (×11): 650 mg via ORAL
  Filled 2018-11-26 (×12): qty 2

## 2018-11-26 MED ORDER — PHENYLEPHRINE 40 MCG/ML (10ML) SYRINGE FOR IV PUSH (FOR BLOOD PRESSURE SUPPORT)
PREFILLED_SYRINGE | INTRAVENOUS | Status: AC
  Filled 2018-11-26: qty 10

## 2018-11-26 MED ORDER — NALBUPHINE HCL 10 MG/ML IJ SOLN: 5.0000 mg | INTRAMUSCULAR | Status: DC | PRN

## 2018-11-26 MED ORDER — ONDANSETRON HCL 4 MG/2ML IJ SOLN
INTRAMUSCULAR | Status: AC
  Filled 2018-11-26: qty 2

## 2018-11-26 MED ORDER — SENNOSIDES-DOCUSATE SODIUM 8.6-50 MG PO TABS
2.0000 | ORAL_TABLET | ORAL | Status: DC
  Administered 2018-11-26 – 2018-11-28 (×3): 2 via ORAL
  Filled 2018-11-26 (×3): qty 2

## 2018-11-26 MED ORDER — NALOXONE HCL 0.4 MG/ML IJ SOLN: 0.4000 mg | INTRAMUSCULAR | Status: DC | PRN

## 2018-11-26 MED ORDER — BUPIVACAINE IN DEXTROSE 0.75-8.25 % IT SOLN
INTRATHECAL | Status: DC | PRN
  Administered 2018-11-26: 1.5 mL via INTRATHECAL

## 2018-11-26 MED ORDER — DIPHENHYDRAMINE HCL 50 MG/ML IJ SOLN: 12.5000 mg | INTRAMUSCULAR | Status: DC | PRN

## 2018-11-26 MED ORDER — FENTANYL CITRATE (PF) 100 MCG/2ML IJ SOLN
100.0000 ug | Freq: Once | INTRAMUSCULAR | Status: AC
  Administered 2018-11-26: 100 ug via EPIDURAL

## 2018-11-26 MED ORDER — ALBUTEROL SULFATE (2.5 MG/3ML) 0.083% IN NEBU: 2.5000 mg | INHALATION_SOLUTION | Freq: Four times a day (QID) | RESPIRATORY_TRACT | Status: DC | PRN

## 2018-11-26 MED ORDER — MORPHINE SULFATE (PF) 0.5 MG/ML IJ SOLN
INTRAMUSCULAR | Status: AC
  Filled 2018-11-26: qty 10

## 2018-11-26 MED ORDER — OXYCODONE HCL 5 MG PO TABS
5.0000 mg | ORAL_TABLET | ORAL | Status: DC | PRN
  Administered 2018-11-26 – 2018-11-27 (×4): 5 mg via ORAL
  Administered 2018-11-27 – 2018-11-29 (×7): 10 mg via ORAL
  Filled 2018-11-26 (×4): qty 2
  Filled 2018-11-26 (×4): qty 1
  Filled 2018-11-26 (×3): qty 2

## 2018-11-26 MED ORDER — NALOXONE HCL 4 MG/10ML IJ SOLN
1.0000 ug/kg/h | INTRAVENOUS | Status: DC | PRN
  Filled 2018-11-26: qty 5

## 2018-11-26 MED ORDER — VANCOMYCIN HCL 1000 MG IV SOLR
1000.0000 mg | Freq: Two times a day (BID) | INTRAVENOUS | Status: AC
  Administered 2018-11-26: 1000 mg via INTRAVENOUS
  Filled 2018-11-26 (×3): qty 1000

## 2018-11-26 MED ORDER — DIPHENHYDRAMINE HCL 25 MG PO CAPS: 25.0000 mg | ORAL_CAPSULE | Freq: Four times a day (QID) | ORAL | Status: DC | PRN

## 2018-11-26 MED ORDER — DEXAMETHASONE SODIUM PHOSPHATE 4 MG/ML IJ SOLN
INTRAMUSCULAR | Status: DC | PRN
  Administered 2018-11-26: 4 mg via INTRAVENOUS

## 2018-11-26 SURGICAL SUPPLY — 39 items
BENZOIN TINCTURE PRP APPL 2/3 (GAUZE/BANDAGES/DRESSINGS) ×2 IMPLANT
CHLORAPREP W/TINT 26ML (MISCELLANEOUS) ×2 IMPLANT
CLAMP CORD UMBIL (MISCELLANEOUS) IMPLANT
CLOSURE STERI STRIP 1/2 X4 (GAUZE/BANDAGES/DRESSINGS) ×2 IMPLANT
CLOTH BEACON ORANGE TIMEOUT ST (SAFETY) ×2 IMPLANT
DRSG OPSITE POSTOP 4X10 (GAUZE/BANDAGES/DRESSINGS) ×2 IMPLANT
ELECT REM PT RETURN 9FT ADLT (ELECTROSURGICAL) ×2
ELECTRODE REM PT RTRN 9FT ADLT (ELECTROSURGICAL) ×1 IMPLANT
EXTRACTOR VACUUM KIWI (MISCELLANEOUS) IMPLANT
EXTRACTOR VACUUM M CUP 4 TUBE (SUCTIONS) IMPLANT
GLOVE BIO SURGEON STRL SZ7 (GLOVE) ×2 IMPLANT
GLOVE BIOGEL PI IND STRL 7.0 (GLOVE) ×2 IMPLANT
GLOVE BIOGEL PI INDICATOR 7.0 (GLOVE) ×2
GOWN STRL REUS W/TWL LRG LVL3 (GOWN DISPOSABLE) ×4 IMPLANT
HOVERMATT SINGLE USE (MISCELLANEOUS) ×2 IMPLANT
KIT ABG SYR 3ML LUER SLIP (SYRINGE) IMPLANT
NEEDLE HYPO 25X5/8 SAFETYGLIDE (NEEDLE) IMPLANT
NS IRRIG 1000ML POUR BTL (IV SOLUTION) ×2 IMPLANT
PACK C SECTION WH (CUSTOM PROCEDURE TRAY) ×2 IMPLANT
PAD OB MATERNITY 4.3X12.25 (PERSONAL CARE ITEMS) ×2 IMPLANT
RETRACTOR TRAXI PANNICULUS (MISCELLANEOUS) ×1 IMPLANT
RTRCTR C-SECT PINK 25CM LRG (MISCELLANEOUS) IMPLANT
STRIP CLOSURE SKIN 1/2X4 (GAUZE/BANDAGES/DRESSINGS) IMPLANT
SUT MNCRL 0 VIOLET CTX 36 (SUTURE) ×2 IMPLANT
SUT MONOCRYL 0 CTX 36 (SUTURE) ×4
SUT PLAIN 0 NONE (SUTURE) IMPLANT
SUT PLAIN 2 0 (SUTURE)
SUT PLAIN 2 0 XLH (SUTURE) ×2 IMPLANT
SUT PLAIN ABS 2-0 CT1 27XMFL (SUTURE) IMPLANT
SUT VIC AB 0 CT1 27 (SUTURE) ×4
SUT VIC AB 0 CT1 27XBRD ANBCTR (SUTURE) ×2 IMPLANT
SUT VIC AB 2-0 CT1 27 (SUTURE) ×2
SUT VIC AB 2-0 CT1 TAPERPNT 27 (SUTURE) ×1 IMPLANT
SUT VIC AB 4-0 KS 27 (SUTURE) ×2 IMPLANT
SUT VICRYL 0 TIES 12 18 (SUTURE) IMPLANT
TOWEL OR 17X24 6PK STRL BLUE (TOWEL DISPOSABLE) ×2 IMPLANT
TRAXI PANNICULUS RETRACTOR (MISCELLANEOUS) ×1
TRAY FOLEY W/BAG SLVR 14FR LF (SET/KITS/TRAYS/PACK) IMPLANT
WATER STERILE IRR 1000ML POUR (IV SOLUTION) ×2 IMPLANT

## 2018-11-26 NOTE — Transfer of Care (Signed)
Immediate Anesthesia Transfer of Care Note  Patient: Melissa Diaz  Procedure(s) Performed: CESAREAN SECTION (N/A )  Patient Location: PACU  Anesthesia Type:Spinal  Level of Consciousness: awake, alert  and patient cooperative  Airway & Oxygen Therapy: Patient Spontanous Breathing  Post-op Assessment: Report given to RN and Post -op Vital signs reviewed and stable  Post vital signs: Reviewed and stable  Last Vitals:  Vitals Value Taken Time  BP 117/64 11/26/2018  6:46 AM  Temp    Pulse 116 11/26/2018  6:50 AM  Resp 29 11/26/2018  6:50 AM  SpO2 96 % 11/26/2018  6:50 AM  Vitals shown include unvalidated device data.  Last Pain:  Vitals:   11/26/18 0452  TempSrc: Oral  PainSc:       Patients Stated Pain Goal: 2 (11/25/18 2230)  Complications: No apparent anesthesia complications

## 2018-11-26 NOTE — Anesthesia Postprocedure Evaluation (Signed)
Anesthesia Post Note  Patient: Melissa Diaz  Procedure(s) Performed: CESAREAN SECTION (N/A )     Patient location during evaluation: Mother Baby Anesthesia Type: Epidural Level of consciousness: awake and alert and oriented Pain management: pain level controlled Vital Signs Assessment: post-procedure vital signs reviewed and stable Respiratory status: spontaneous breathing and nonlabored ventilation Cardiovascular status: stable Postop Assessment: no headache, no backache, patient able to bend at knees, no signs of nausea or vomiting, adequate PO intake and able to ambulate Anesthetic complications: no    Last Vitals:  Vitals:   11/26/18 1121 11/26/18 1530  BP:  (!) 115/56  Pulse:  82  Resp: 20 19  Temp: 36.7 C 36.6 C  SpO2: 100% 100%    Last Pain:  Vitals:   11/26/18 1530  TempSrc: Oral  PainSc: 0-No pain   Pain Goal: Patients Stated Pain Goal: 2 (11/25/18 2230)                 Madison Hickman

## 2018-11-26 NOTE — Lactation Note (Signed)
This note was copied from a baby's chart. Lactation Consultation Note  Patient Name: Melissa Diaz [id number] Date: 11/26/2018 Reason for consult: Initial assessment;Term  P3 mother whose infant is now 32 hours old.  Mother breast fed her first child (now 28 years old) for 2 years and her second child (now 69 years old) for 7 months.  Baby was asleep on mother's chest when I arrived.  Encouraged mother to feed 8-12 times/24 hours or sooner if baby shows feeding cues.  Reviewed cues.  Mother will perform hand expression before/after feedings to help increase milk supply.  Colostrum container provided and milk storage times reviewed.    Mother will call for latch assistance as needed.  She will be a "stay at home" mother and has a DEBP for home use.  Father present.   Maternal Data Formula Feeding for Exclusion: No Has patient been taught Hand Expression?: Yes Does the patient have breastfeeding experience prior to this delivery?: Yes  Feeding Feeding Type: Breast Fed  LATCH Score                   Interventions    Lactation Tools Discussed/Used     Consult Status Consult Status: Follow-up Date: 11/27/18 Follow-up type: In-patient    Lemario Chaikin R Torin Whisner 11/26/2018, 1:20 PM

## 2018-11-26 NOTE — Progress Notes (Signed)
Patients IV infiltrated. Nurse called Linnell Fulling CNM to inform her about the IV and that the patient is receiving IV antibiotics. Kenney Houseman said the patient was fine without the antibiotics because she is afebrile, no need to restart an IV at this time.

## 2018-11-26 NOTE — Op Note (Addendum)
Cesarean Section Procedure Note   Melissa Diaz  11/26/2018  Indications: 39 wks, protracted labor then arrest of descent.    Procedure: Unscheduled Emrgency Primary Low Transverse Cesarean Section  Pre-operative Diagnosis:  40.4 wks, Arrest of descent, CPD, Chorioamioitis.   Post-operative Diagnosis: Same, Direct OP position  Surgeon: Shea Evans, MD - Primary   Assistants: none  Anesthesia: epidural was not adequate, so sat her up for Spinal which worked well  Procedure Details:  The patient was seen in the Labor Room. The risks, benefits, complications, treatment options, and expected outcomes were discussed with the patient. The patient concurred with the proposed plan, giving informed consent. identified as [name] and the procedure verified as C-Section Delivery. A Time Out was held and the above information confirmed.  After induction of anesthesia, the patient was draped and prepped in the usual sterile manner, foley was draining urine well.  A pfannenstiel incision was made and carried down through the subcutaneous tissue to the fascia. Fascial incision was made and extended transversely. The fascia was separated from the underlying rectus tissue superiorly and inferiorly. The peritoneum was identified and entered. Peritoneal incision was extended longitudinally. Alexis-O retractor placed. The utero-vesical peritoneal reflection was incised transversely and the bladder flap was bluntly freed from the lower uterine segment. A low transverse uterine incision was made. Clear amniotic fluid drained. Delivered from cephalic presentation, direct OP position was a FEMALE infant with vigorous cry. Apgar scores of 9 at one minute and 9 at five minutes. Delayed cord clamping done at 35 seconds due to maternal bleeding and baby handed to NICU team in attendance. Cord ph was not sent. Cord blood was obtained for evaluation. The placenta was removed Intact and appeared normal. The  uterine outline, tubes and ovaries appeared normal}. The uterine incision was closed with running locked sutures of . A second imbricating layer sutured.   Hemostasis was observed. Alexis retractor removed. Peritoneal closure done with 2-0 Vicryl.  The fascia was then reapproximated with running sutures of 0Vicryl. The subcuticular closure was performed using 2-0plain gut. The skin was closed with 4-0Vicryl.   Instrument, sponge, and needle counts were correct prior the abdominal closure and were correct at the conclusion of the case.    Findings:  BOY, direct OP, delivered from [facility] hysterotomy. 2 layer closure. Normal placenta, uterus, tubes, ovaries.    Estimated Blood Loss: 408 mL   Total IV Fluids: LR 2500 ml   Urine Output: 125CC OF clear urine  Specimens: cord blood  Complications: no complications  Disposition: PACU - hemodynamically stable.   Maternal Condition: stable   Baby condition / location:  Couplet care / Skin to Skin  Attending Attestation: I performed the procedure.   Signed: Surgeon(s): Shea Evans, MD

## 2018-11-26 NOTE — Anesthesia Postprocedure Evaluation (Signed)
Anesthesia Post Note  Patient: Melissa Diaz  Procedure(s) Performed: CESAREAN SECTION (N/A )     Anesthesia Post Evaluation  Last Vitals:  Vitals:   11/26/18 1030 11/26/18 1121  BP: 110/66   Pulse: 75   Resp: 17 20  Temp: 36.6 C 36.7 C  SpO2: 98% 100%    Last Pain:  Vitals:   11/26/18 1123  TempSrc:   PainSc: 0-No pain                 Jeylin Woodmansee

## 2018-11-26 NOTE — Progress Notes (Signed)
Melissa Diaz is a 28 y.o. [id number] at [redacted]w[redacted]d, IOL for dates and suspect bigger baby than prior 2.   Subjective: Off and on vaginal pain with UCs, got repeat boluses in epidural   Objective: BP (!) 81/66   Pulse (!) 140   Temp (!) 101 F (38.3 C)   Resp 16   Ht 5\' 1"  (1.549 m)   Wt 113.4 kg   SpO2 96%   BMI 47.24 kg/m  No intake/output data recorded. 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 Proceed with C-section, agrees.  Chorioamnionitis-PCN allergy (Hives) never took cephalosporins. Vancomycin for GBS, 2nd dose at 12.05 AM, chorioamnionitis, added 24hr dose Gentamicin at midnight as well- no abx coverage for surgery but continue post-op Gentamicin and Vancomycin for 2 days due to chorioamnionitis  Risks/complications of surgery reviewed incl infection, bleeding, damage to internal organs including bladder, bowels, ureters, blood vessels, other risks from anesthesia, VTE and delayed complications of any surgery, complications in future surgery reviewed. Also discussed neonatal complications incl difficult delivery, laceration, vacuum assistance, TTN etc. Pt understands and agrees, all concerns addressed.    [name] 11/26/2018, 3:50 AM

## 2018-11-26 NOTE — Progress Notes (Signed)
Melissa Diaz is a 28 y.o. [id number] at [redacted]w[redacted]d, IOL for dates and suspect bigger baby than prior 2.   Subjective: Pain better since Anesthesiologist assessed and gave her a bolus. Vaginal pain continues with UCs but lasts less longer. No rectal pressure   Objective: BP (!) 99/40   Pulse (!) 128   Temp (!) 101 F (38.3 C)   Resp 16   Ht 5\' 1"  (1.549 m)   Wt 113.4 kg   SpO2 96%   BMI 47.24 kg/m  No intake/output data recorded. Total I/O In: -  Out: 500 [Urine:500]  FHT:  FHR: baseline change to 160-170 bpm, variability: moderate,  accelerations:  Present,  decelerations:  Present occ variable decels  UC:   regular, every 3 minutes with pitocin at 12 mu rate SVE:  Complete and -3/ now head more aligned with inlet and entering inlet with contraction  Assessment / Plan: Long latent phase, now entered stage 2.  I/D:  GBS+, on Vancomycin. 2nd dose hung. also chorioamnionitis, added Gentamycin once/day single dosing  Anticipated MOD:  working towards vaginal delivery if baby continues descent.   Right lateral position with Peanut b/w knees.  Reassess in an hour.   [name] 11/26/2018, 12:45 AM

## 2018-11-26 NOTE — Anesthesia Procedure Notes (Signed)
Spinal  Patient location during procedure: OR Start time: 11/26/2018 5:34 AM End time: 11/26/2018 5:41 AM Staffing Anesthesiologist: Bethena Midget, MD Preanesthetic Checklist Completed: patient identified, site marked, surgical consent, pre-op evaluation, timeout performed, IV checked, risks and benefits discussed and monitors and equipment checked Spinal Block Patient position: sitting Prep: DuraPrep Patient monitoring: heart rate, cardiac monitor, continuous pulse ox and blood pressure Approach: midline Location: L3-4 Injection technique: single-shot Needle Needle type: Sprotte  Needle gauge: 24 G Needle length: 9 cm Assessment Sensory level: T4 Events: paresthesia Additional Notes Questionable epidural level change to spinal.  Difficult with multiple attempts.  22g X 5 Inch needle successful

## 2018-11-27 DIAGNOSIS — O9902 Anemia complicating childbirth: Secondary | ICD-10-CM | POA: Diagnosis present

## 2018-11-27 LAB — CBC
HCT: 23.9 % — ABNORMAL LOW (ref 36.0–46.0)
Hemoglobin: 7.1 g/dL — ABNORMAL LOW (ref 12.0–15.0)
MCH: 22.8 pg — ABNORMAL LOW (ref 26.0–34.0)
MCHC: 29.7 g/dL — ABNORMAL LOW (ref 30.0–36.0)
MCV: 76.6 fL — ABNORMAL LOW (ref 80.0–100.0)
Platelets: 191 10*3/uL (ref 150–400)
RBC: 3.12 MIL/uL — ABNORMAL LOW (ref 3.87–5.11)
RDW: 16 % — ABNORMAL HIGH (ref 11.5–15.5)
WBC: 17.5 10*3/uL — ABNORMAL HIGH (ref 4.0–10.5)
nRBC: 0 % (ref 0.0–0.2)

## 2018-11-27 MED ORDER — IBUPROFEN 600 MG PO TABS: 600.0000 mg | ORAL_TABLET | Freq: Four times a day (QID) | ORAL | Status: DC

## 2018-11-27 MED ORDER — SODIUM CHLORIDE 0.9 % IV SOLN
510.0000 mg | Freq: Once | INTRAVENOUS | Status: AC
  Administered 2018-11-27: 510 mg via INTRAVENOUS
  Filled 2018-11-27: qty 17

## 2018-11-27 MED ORDER — CYCLOBENZAPRINE HCL 5 MG PO TABS
7.5000 mg | ORAL_TABLET | Freq: Three times a day (TID) | ORAL | Status: DC | PRN
  Administered 2018-11-27 – 2018-11-28 (×2): 7.5 mg via ORAL
  Filled 2018-11-27 (×5): qty 1.5

## 2018-11-27 NOTE — Progress Notes (Signed)
Patient ID: Melissa Diaz, female   DOB: 1991-04-29, 28 y.o.   MRN: [id number] Subjective: POD# 1 Live born female  Birth Weight: 8 lb 3.9 oz (3740 g) APGAR: 9, 9  Newborn Delivery   Birth date/time:  11/26/2018 05:51:00 Delivery type:  C-Section, Low Transverse Trial of labor:  Yes C-section categorization:  Primary    Baby name: Melissa Diaz Delivering provider: MODY, VAISHALI   circumcision planned Feeding: breast  Pain control at delivery: Epidural;Spinal   Reports feeling well overall, nagging HA (5/10) since hospital admit unrelieved by Tylenol.  Patient reports tolerating PO.   Breast symptoms: none Pain controlled with PO meds Denies HA/SOB/C/P/N/V/dizziness. Flatus present. She reports vaginal bleeding as normal, without clots.  She is ambulating, urinating without difficulty.     Objective:   VS:    Vitals:   11/26/18 1941 11/26/18 2325 11/27/18 0308 11/27/18 0506  BP: (!) 66/50 107/65 (!) 78/58 (!) 77/43  Pulse: 85 86 73 77  Resp: 18 18 18 18   Temp: 99.3 F (37.4 C) 97.8 F (36.6 C) 98.3 F (36.8 C) (!) 97.5 F (36.4 C)  TempSrc: Oral Oral Oral Oral  SpO2: 98% 100%  100%  Weight:      Height:          Intake/Output Summary (Last 24 hours) at 11/27/2018 1011 Last data filed at 11/27/2018 0100 Gross per 24 hour  Intake --  Output 2150 ml  Net -2150 ml        Recent Labs    11/26/18 1133 11/27/18 0602  WBC 23.7* 17.5*  HGB 9.3* 7.1*  HCT 30.7* 23.9*  PLT 242 191     Blood type: --/--/O POS, O POS Performed at [facility], [address] [place], [place] [address]  [phone])  Rubella: Immune (09/09 0000)  Vaccines: TDaP UTD         Flu    UTD   Physical Exam:  General: alert, cooperative and no distress CV: Regular rate and rhythm Resp: clear Abdomen: soft, nontender, normal bowel sounds Incision: clean, dry and intact Uterine Fundus: firm, below umbilicus, nontender Lochia: minimal Ext: no edema, redness or tenderness in the  calves or thighs      Assessment/Plan: 28 y.o.   POD# 1. [id number]                  Principal Problem:   Postpartum care following cesarean delivery (3/23) Active Problems:   Encounter for elective induction of labor   Oligohydramnios   Delivery by emergency cesarean - arrest of dilation   Maternal anemia, with delivery  - feraheme IV x 1 then start oral fe and mag ox  Doing well, stable.    Persistent HA - trial NSAID today -hx ASA allergy / hives during meningitis episode  monitor closely for allergic reaction.  Advance diet as tolerated Encourage rest when baby rests Breastfeeding support Encourage to ambulate Routine post-op care  Melissa Diaz, CNM, MSN 11/27/2018, 10:11 AM

## 2018-11-28 MED ORDER — MAGNESIUM OXIDE 400 (241.3 MG) MG PO TABS
400.0000 mg | ORAL_TABLET | Freq: Every day | ORAL | Status: DC
  Administered 2018-11-29: 400 mg via ORAL
  Filled 2018-11-28: qty 1

## 2018-11-28 MED ORDER — FERROUS SULFATE 325 (65 FE) MG PO TABS
325.0000 mg | ORAL_TABLET | Freq: Two times a day (BID) | ORAL | Status: DC
  Administered 2018-11-28 – 2018-11-29 (×2): 325 mg via ORAL
  Filled 2018-11-28 (×2): qty 1

## 2018-11-28 NOTE — Progress Notes (Signed)
POSTOPERATIVE DAY # 2 S/P Primary LTCS for arrest of dilation, baby boy "[name]"   S:         Reports feeling tired and sore   S/p anesthesia evaluation for headache, neck pain at base of neck and for tingling of right foot; using heating pads PRN. Denies issues with ambulation              Tolerating po intake / no nausea / no vomiting / + flatus / no BM  Denies dizziness, SOB, or CP             Bleeding is light             Pain controlled with Tylenol, Oxycodone IR              Up ad lib / ambulatory/ voiding QS  Newborn breast feeding - going well; baby is cluster feeding now/ Circumcision - completed   O:  VS: BP 112/66 (BP Location: Right Arm)   Pulse 72   Temp 98.4 F (36.9 C) (Oral)   Resp 18   Ht 5\' 1"  (1.549 m)   Wt 113.4 kg   SpO2 100%   BMI 47.24 kg/m    LABS:               Recent Labs    11/26/18 1133 11/27/18 0602  WBC 23.7* 17.5*  HGB 9.3* 7.1*  PLT 242 191               Bloodtype: --/--/O POS, O POS Performed at [facility], [address] [place], [place] [address]  [phone])  Rubella: Immune (09/09 0000)                                             I&O: Intake/Output      03/24 0701 - 03/25 0700 03/25 0701 - 03/26 0700   I.V. (mL/kg) 0 (0)    Other 0    Total Intake(mL/kg) 0 (0)    Urine (mL/kg/hr) 450 (0.2)    Total Output 450    Net -450          Pain management: epidural and spinal             Physical Exam:             Alert and Oriented X3  Lungs: Clear and unlabored  Heart: regular rate and rhythm / no murmurs  Abdomen: soft, non-tender, non-distended, active bowel sounds              Fundus: firm, non-tender, U-1             Dressing: honeycomb with steri-strips c/d/i; old shadowing noted             Incision:  approximated with sutures / no erythema / no ecchymosis / no  drainage  Perineum: intact  Lochia: small, no clots   Extremities: trace pedal edema, no calf pain or tenderness,   A/P:     POD # 2 S/P Primary LTCS              ABL Anemia compounding chronic IDA   - s/p IV Feraheme x 1 dose, now on oral FE   - currently asymptomatic, no need for blood products at this time  Persistent HA and neck pain    - s/p  Anesthesia evaluation (see notes for details)   - Recommend continuing oral main medications and Flexeril PRN   - Encouraged heating pad use   Routine postoperative care              Encouraged to rest when baby rests  See lactation today   Warm liquids and ambulation to promote bowel motility   Anticipate discharge home tomorrow   Carlean Jews, MSN, CNM [facility]

## 2018-11-28 NOTE — Progress Notes (Signed)
Pt is POD#2 from C/S under spinal anesthesia.  Since the C/S she states that she has had a dull frontal headache as well as a stronger neck and shoulder pain.  She says that the shoulder pain is made worse when she stands or sits upright due to a tense/tight feeling in neck and shoulders that keeps her hunched over.  The frontal headache remains dull.  She also describes mild weakness and some tingling in her right leg.  She says that this was first noticed after her C/S.  She did recall feeling a brief shooting pain/sensation down the right leg during her spinal placement prior to C/S.  She states she has no difficulty in ambulating.  Denies any changes to her bowel or bladder function.  She labored with an epidural prior to coming for C/S.  Upon arrival for C/S, the analgesic level was insufficient and multiple spinal attempts were made before success.  PE:   General: Pleasant, alert, smiling with conversation.  No photophobia. LLE: 5/5 strength in quad, dorsiflexion and plantarflexion. RLE: 4/5 strength in quad, dorsiflextion and plantarflexion.  A/P: 1. Neck/Shoulder pain - Suspicion for post-dural puncture headache is low in this situation.  Suspect muscular strain in neck and shoulders as underlying cause.  Possible post-surgical retained sub-diaphragmatic air.  Continue pain medicines and muscle relaxants PRN.    2. 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  In most cases this is transient irritation lasting days to weeks.  Pt was advised to seek help in ER if symptoms dramatically worsened or if she lost bowel or bladder function.

## 2018-11-29 MED ORDER — OXYCODONE HCL 5 MG PO TABS: 5.0000 mg | ORAL_TABLET | Freq: Four times a day (QID) | ORAL | 0 refills | Status: DC | PRN

## 2018-11-29 MED ORDER — MAGNESIUM OXIDE 400 (241.3 MG) MG PO TABS: 400.0000 mg | ORAL_TABLET | Freq: Every day | ORAL | 0 refills | Status: DC

## 2018-11-29 MED ORDER — CYCLOBENZAPRINE HCL 7.5 MG PO TABS: 7.5000 mg | ORAL_TABLET | Freq: Three times a day (TID) | ORAL | 0 refills | Status: DC | PRN

## 2018-11-29 MED ORDER — POLYSACCHARIDE IRON COMPLEX 150 MG PO CAPS: 150.0000 mg | ORAL_CAPSULE | Freq: Every day | ORAL | 0 refills | Status: DC

## 2018-11-29 NOTE — Progress Notes (Signed)
POSTOPERATIVE DAY # 3 S/P CS - active phase arrest  S:         Reports feeling well- some neck and upper back pain -muscle aches             Tolerating po intake / no nausea / no vomiting / + flatus / no BM             Bleeding is light             Pain controlled with Motrin and Oxycodone             Up ad lib / ambulatory/ voiding QS  Newborn Breast   O:  VS: BP (!) 108/58 (BP Location: Right Arm)   Pulse 67   Temp 98 F (36.7 C) (Oral)   Resp 18   Ht 5\' 1"  (1.549 m)   Wt 113.4 kg   SpO2 100%   BMI 47.24 kg/m    LABS:              Recent Labs    11/26/18 1133 11/27/18 0602  WBC 23.7* 17.5*  HGB 9.3* 7.1*  PLT 242 191               Bloodtype: --/--/O POS, O POS Performed at [facility], [address] [place], [place] [address]  [phone])  Rubella: Immune (09/09 0000)                                Physical Exam:             Alert and Oriented X3  Lungs: Clear and unlabored  Heart: regular rate and rhythm / no mumurs  Abdomen: soft, non-tender, non-distended, active BS             Fundus: firm, non-tender, Ueven             Dressing intact - new dressing yesterday (wet in shower)              Incision:  approximated with suture / no erythema / no ecchymosis / no drainage  Perineum: intact  Lochia: light  Extremities: R-pedal 1+ and L-pedal trace edema, no calf pain or tenderness, negative Homans  A:        POD # 3 S/P  CS            IDA with compounded blood loss post-op  P:        Routine postoperative care              DC home - [facility]OB booklet / instructions reviewed             Iron and mag ox x 6 weeks             Analgesia and muscle relaxant for pain control    Marlinda Mike CNM, MSN, [facility] 11/29/2018, 8:37 AM

## 2018-11-29 NOTE — Lactation Note (Signed)
This note was copied from a baby's chart. Lactation Consultation Note  Patient Name: Melissa Diaz PRXYV'O Date: 11/29/2018   P2, Baby 73 hours old.  Baby recently breastfed for 30 min. Mother denies questions or concerns.  Stools transitioning. Feed on demand approximately 8-12 times per day.   Reviewed engorgement care and monitoring voids/stools.      Maternal Data    Feeding Feeding Type: Breast Fed  LATCH Score                   Interventions    Lactation Tools Discussed/Used     Consult Status      Hardie Pulley 11/29/2018, 7:36 AM

## 2018-11-29 NOTE — Discharge Summary (Signed)
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, [place], [place] [address]  [phone]) Antibody: NEG (03/22 0855) Rubella: Immune (09/09 0000)   RPR: Non Reactive (03/22 0901)  HBsAg: Negative (09/09 0000)  HIV: Non-reactive (09/09 0000)  GBS: Positive (03/03 0000)                                    Hospital course:  Induction of Labor With Cesarean Section  28 y.o. yo [id number] at [redacted]w[redacted]d was admitted to the hospital 11/25/2018 for induction of labor at term with estimated LGA - bigger than previous children. Patient had successful IOL and received GBS prophylaxis and epidural anesthesia. The labor course significant for arrest of descent after complete dilation with persistent OP fetal lie. The patient went for cesarean section due to Arrest of Descent, and delivered a Viable infant,11/26/2018  Membrane Rupture Time/Date: 5:45 PM ,11/25/2018  Details of operation can be found in separate operative note.    Patient had an uncomplicated postpartum course. She is ambulating, tolerating a regular diet, passing flatus, and urinating well.  Patient is discharged home in stable condition on 11/29/18 on POD #3                              Delivering PROVIDER: MODY, VAISHALI                                                            Complications: None  Newborn Data: Live born female  Birth Weight: 8 lb 3.9  oz (3740 g) APGAR: 9, 9  Newborn Delivery   Birth date/time:  11/26/2018 05:51:00 Delivery type:  C-Section, Low Transverse Trial of labor:  Yes C-section categorization:  Primary     Baby Feeding: Breast Disposition:home with mother  Post partum procedures: none  Labs: Lab Results  Component Value Date   WBC 17.5 (H) 11/27/2018   HGB 7.1 (L) 11/27/2018   HCT 23.9 (L) 11/27/2018   MCV 76.6 (L) 11/27/2018   PLT 191 11/27/2018   CMP Latest Ref Rng & Units 11/26/2018  Glucose 70 - 99 mg/dL -  BUN 6 - 20 mg/dL -  Creatinine 4.12 - 8.78 mg/dL 6.76  Sodium 720 - 947 mmol/L -  Potassium 3.5 - 5.1 mmol/L -  Chloride 98 - 111 mmol/L -  CO2 22 - 32 mmol/L -  Calcium 8.9 - 10.3 mg/dL -  Total Protein 6.5 - 8.1 g/dL -  Total Bilirubin 0.3 - 1.2 mg/dL -  Alkaline Phos 38 - 096 U/L -  AST 15 - 41 U/L -  ALT 0 - 44 U/L -      Physical Exam @ time of discharge:  Vitals:   11/28/18 0542 11/28/18 0911 11/28/18 2225 11/29/18 0543  BP: 105/61 112/66 123/74 (!) 108/58  Pulse:  72 84 67  Resp: 18 18 18 18   Temp: 98.4 F (36.9 C) 98.4 F (36.9 C) 99.1 F (37.3 C) 98 F (36.7 C)  TempSrc: Oral Oral Oral Oral  SpO2: 100%  100% 100%  Weight:      Height:        General: alert, cooperative and no distress Lochia: appropriate Uterine Fundus: firm Perineum: intact Incision: Healing well with no significant drainage Extremities: DVT Evaluation: No evidence of DVT seen on physical exam.   Discharge instructions:  "Baby and Me Booklet" and [facility] Booklet  Discharge Medications:  Allergies as of 11/29/2018      Reactions   Aspirin Hives   Hives and lip swelling   Latex Hives   Loratadine Hives   Penicillins Hives   Did it involve swelling of the face/tongue/throat, SOB, or low BP? No Did it involve sudden or severe rash/hives, skin peeling, or any reaction on the inside of your mouth or nose? Yes Did you need to seek medical attention at a hospital or doctor's office?  No When did it last happen?1 year ago If all above answers are "NO", may proceed with cephalosporin use.      Medication List    TAKE these medications   albuterol 108 (90 Base) MCG/ACT inhaler Commonly known as:  PROVENTIL HFA;VENTOLIN HFA Inhale 1-2 puffs into the lungs every 6 (six) hours as needed for wheezing or shortness of breath.   cetirizine 10 MG tablet Commonly known as:  ZYRTEC Take 10 mg by mouth at bedtime.   cyclobenzaprine 7.5 MG tablet Commonly known as:  FEXMID Take 1 tablet (7.5 mg total) by mouth 3 (three) times daily as needed for muscle spasms.   iron polysaccharides 150 MG capsule Commonly known as:  NIFEREX Take 1 capsule (150 mg total) by mouth daily.   magnesium oxide 400 (241.3 Mg) MG tablet Commonly known as:  MAG-OX Take 1 tablet (400 mg total) by mouth daily.   oxyCODONE 5 MG immediate release tablet Commonly known as:  Oxy IR/ROXICODONE Take 1 tablet (5 mg total) by mouth every 6 (six) hours as needed for moderate pain.   prenatal multivitamin Tabs tablet Take 1 tablet by mouth daily at 12 noon.            Discharge Care Instructions  (From admission, onward)         Start     Ordered   11/29/18 0000  Discharge wound care:    Comments:  Leave honeycomb in place for 5 days - remove if get wet in shower. Leave steri-strips in place x 2 weeks. Keep incision clean and dry   11/29/18 0901          Diet: routine diet  Activity: Advance as tolerated. Pelvic rest x 6 weeks.   Follow up:6 weeks    Signed: Marlinda Mike CNM, MSN, FACNM 11/29/2018, 9:02 AM

## 2018-11-30 ENCOUNTER — Encounter (HOSPITAL_COMMUNITY): Payer: Self-pay | Admitting: Obstetrics & Gynecology

## 2019-02-23 ENCOUNTER — Encounter (HOSPITAL_COMMUNITY): Payer: Self-pay

## 2019-09-06 NOTE — L&D Delivery Note (Signed)
  Delivery Note Pt felt strong contractions and pressure in her bottom.  C/C, +1, arom performed with foul smelling, yellow/green fluid (not thick fluid); NICU team called for delivery  At  1550 a viable female was delivered over intact perineum via vbac  (Presentation:cephalic ; LOA ).  APGAR: , ; weight  .   Placenta status: delivered ,intact - to pathology.  Cord: 3vv,  with the following complications:none .  Anesthesia:  epidural Episiotomy:  none Lacerations:  none Suture Repair: n/a Est. Blood Loss (mL):   Mom to postpartum.  NICU team in room for delivery, infant stabilized and taken to NICU with dad.  Vick Frees 02/07/2020, 4:14 PM

## 2020-02-07 ENCOUNTER — Inpatient Hospital Stay (HOSPITAL_COMMUNITY): Payer: 59 | Admitting: Anesthesiology

## 2020-02-07 ENCOUNTER — Inpatient Hospital Stay (HOSPITAL_COMMUNITY)
Admission: AD | Admit: 2020-02-07 | Discharge: 2020-02-08 | DRG: 805 | Disposition: A | Discharge status: Home / Self Care | Payer: 59 | Attending: Obstetrics & Gynecology | Admitting: Obstetrics & Gynecology

## 2020-02-07 ENCOUNTER — Other Ambulatory Visit: Payer: Self-pay

## 2020-02-07 ENCOUNTER — Encounter (HOSPITAL_COMMUNITY): Payer: Self-pay | Admitting: *Deleted

## 2020-02-07 DIAGNOSIS — E669 Obesity, unspecified: Secondary | ICD-10-CM | POA: Diagnosis present

## 2020-02-07 DIAGNOSIS — O9952 Diseases of the respiratory system complicating childbirth: Secondary | ICD-10-CM | POA: Diagnosis present

## 2020-02-07 DIAGNOSIS — K219 Gastro-esophageal reflux disease without esophagitis: Secondary | ICD-10-CM | POA: Diagnosis present

## 2020-02-07 DIAGNOSIS — D649 Anemia, unspecified: Secondary | ICD-10-CM | POA: Diagnosis present

## 2020-02-07 DIAGNOSIS — Z886 Allergy status to analgesic agent status: Secondary | ICD-10-CM | POA: Diagnosis not present

## 2020-02-07 DIAGNOSIS — Z3A24 24 weeks gestation of pregnancy: Secondary | ICD-10-CM

## 2020-02-07 DIAGNOSIS — Z20822 Contact with and (suspected) exposure to covid-19: Secondary | ICD-10-CM | POA: Diagnosis present

## 2020-02-07 DIAGNOSIS — O99214 Obesity complicating childbirth: Secondary | ICD-10-CM | POA: Diagnosis present

## 2020-02-07 DIAGNOSIS — O411220 Chorioamnionitis, second trimester, not applicable or unspecified: Secondary | ICD-10-CM | POA: Diagnosis present

## 2020-02-07 DIAGNOSIS — Z880 Allergy status to penicillin: Secondary | ICD-10-CM | POA: Diagnosis not present

## 2020-02-07 DIAGNOSIS — O34211 Maternal care for low transverse scar from previous cesarean delivery: Secondary | ICD-10-CM | POA: Diagnosis present

## 2020-02-07 DIAGNOSIS — O9902 Anemia complicating childbirth: Secondary | ICD-10-CM | POA: Diagnosis present

## 2020-02-07 DIAGNOSIS — O26872 Cervical shortening, second trimester: Secondary | ICD-10-CM | POA: Diagnosis present

## 2020-02-07 DIAGNOSIS — Z91040 Latex allergy status: Secondary | ICD-10-CM

## 2020-02-07 HISTORY — DX: Other complications of anesthesia, initial encounter: T88.59XA

## 2020-02-07 LAB — URINALYSIS, ROUTINE W REFLEX MICROSCOPIC
Bacteria, UA: NONE SEEN
Bilirubin Urine: NEGATIVE
Glucose, UA: NEGATIVE mg/dL
Ketones, ur: 80 mg/dL — AB
Nitrite: NEGATIVE
Protein, ur: NEGATIVE mg/dL
Specific Gravity, Urine: 1.01 (ref 1.005–1.030)
pH: 6 (ref 5.0–8.0)

## 2020-02-07 LAB — SARS CORONAVIRUS 2 BY RT PCR (HOSPITAL ORDER, PERFORMED IN ~~LOC~~ HOSPITAL LAB): SARS Coronavirus 2: NEGATIVE

## 2020-02-07 LAB — CBC
HCT: 35.1 % — ABNORMAL LOW (ref 36.0–46.0)
Hemoglobin: 11.1 g/dL — ABNORMAL LOW (ref 12.0–15.0)
MCH: 26.1 pg (ref 26.0–34.0)
MCHC: 31.6 g/dL (ref 30.0–36.0)
MCV: 82.6 fL (ref 80.0–100.0)
Platelets: 242 10*3/uL (ref 150–400)
RBC: 4.25 MIL/uL (ref 3.87–5.11)
RDW: 14.8 % (ref 11.5–15.5)
WBC: 14.2 10*3/uL — ABNORMAL HIGH (ref 4.0–10.5)
nRBC: 0 % (ref 0.0–0.2)

## 2020-02-07 LAB — TYPE AND SCREEN
ABO/RH(D): O POS
Antibody Screen: NEGATIVE

## 2020-02-07 LAB — RPR: RPR Ser Ql: NONREACTIVE

## 2020-02-07 MED ORDER — LACTATED RINGERS IV SOLN
500.0000 mL | Freq: Once | INTRAVENOUS | Status: AC
  Administered 2020-02-07: 500 mL via INTRAVENOUS

## 2020-02-07 MED ORDER — PHENYLEPHRINE 40 MCG/ML (10ML) SYRINGE FOR IV PUSH (FOR BLOOD PRESSURE SUPPORT): 80.0000 ug | PREFILLED_SYRINGE | INTRAVENOUS | Status: DC | PRN

## 2020-02-07 MED ORDER — LACTATED RINGERS IV SOLN: 500.0000 mL | INTRAVENOUS | Status: DC | PRN

## 2020-02-07 MED ORDER — EPHEDRINE 5 MG/ML INJ: 10.0000 mg | INTRAVENOUS | Status: DC | PRN

## 2020-02-07 MED ORDER — CEFAZOLIN SODIUM-DEXTROSE 2-4 GM/100ML-% IV SOLN
2.0000 g | Freq: Three times a day (TID) | INTRAVENOUS | Status: DC
  Filled 2020-02-07: qty 100

## 2020-02-07 MED ORDER — CEFAZOLIN SODIUM-DEXTROSE 2-4 GM/100ML-% IV SOLN
2.0000 g | Freq: Three times a day (TID) | INTRAVENOUS | Status: AC
  Administered 2020-02-07 – 2020-02-08 (×4): 2 g via INTRAVENOUS
  Filled 2020-02-07 (×4): qty 100

## 2020-02-07 MED ORDER — ACETAMINOPHEN 325 MG PO TABS: 650.0000 mg | ORAL_TABLET | ORAL | Status: DC | PRN

## 2020-02-07 MED ORDER — LACTATED RINGERS IV SOLN: INTRAVENOUS | Status: DC

## 2020-02-07 MED ORDER — BETAMETHASONE SOD PHOS & ACET 6 (3-3) MG/ML IJ SUSP
12.0000 mg | INTRAMUSCULAR | Status: DC
  Administered 2020-02-07: 12 mg via INTRAMUSCULAR
  Filled 2020-02-07: qty 5

## 2020-02-07 MED ORDER — DIPHENHYDRAMINE HCL 50 MG/ML IJ SOLN: 12.5000 mg | INTRAMUSCULAR | Status: DC | PRN

## 2020-02-07 MED ORDER — ONDANSETRON HCL 4 MG PO TABS: 4.0000 mg | ORAL_TABLET | ORAL | Status: DC | PRN

## 2020-02-07 MED ORDER — SODIUM CHLORIDE (PF) 0.9 % IJ SOLN
INTRAMUSCULAR | Status: DC | PRN
  Administered 2020-02-07: 12 mL/h via EPIDURAL

## 2020-02-07 MED ORDER — OXYTOCIN BOLUS FROM INFUSION
500.0000 mL | Freq: Once | INTRAVENOUS | Status: AC
  Administered 2020-02-07: 500 mL via INTRAVENOUS

## 2020-02-07 MED ORDER — WITCH HAZEL-GLYCERIN EX PADS: 1.0000 "application " | MEDICATED_PAD | CUTANEOUS | Status: DC | PRN

## 2020-02-07 MED ORDER — ACETAMINOPHEN 500 MG PO TABS
1000.0000 mg | ORAL_TABLET | Freq: Four times a day (QID) | ORAL | Status: DC | PRN
  Administered 2020-02-07 – 2020-02-08 (×4): 1000 mg via ORAL
  Filled 2020-02-07 (×4): qty 2

## 2020-02-07 MED ORDER — DIPHENHYDRAMINE HCL 25 MG PO CAPS: 25.0000 mg | ORAL_CAPSULE | Freq: Four times a day (QID) | ORAL | Status: DC | PRN

## 2020-02-07 MED ORDER — FENTANYL CITRATE (PF) 100 MCG/2ML IJ SOLN
50.0000 ug | INTRAMUSCULAR | Status: DC | PRN
  Administered 2020-02-07 (×3): 100 ug via INTRAVENOUS
  Filled 2020-02-07 (×3): qty 2

## 2020-02-07 MED ORDER — SOD CITRATE-CITRIC ACID 500-334 MG/5ML PO SOLN: 30.0000 mL | ORAL | Status: DC | PRN

## 2020-02-07 MED ORDER — TETANUS-DIPHTH-ACELL PERTUSSIS 5-2.5-18.5 LF-MCG/0.5 IM SUSP: 0.5000 mL | Freq: Once | INTRAMUSCULAR | Status: DC

## 2020-02-07 MED ORDER — FENTANYL-BUPIVACAINE-NACL 0.5-0.125-0.9 MG/250ML-% EP SOLN
12.0000 mL/h | EPIDURAL | Status: DC | PRN
  Filled 2020-02-07: qty 250

## 2020-02-07 MED ORDER — MAGNESIUM SULFATE BOLUS VIA INFUSION
4.0000 g | Freq: Once | INTRAVENOUS | Status: AC
  Administered 2020-02-07: 4 g via INTRAVENOUS
  Filled 2020-02-07: qty 1000

## 2020-02-07 MED ORDER — ONDANSETRON HCL 4 MG/2ML IJ SOLN: 4.0000 mg | INTRAMUSCULAR | Status: DC | PRN

## 2020-02-07 MED ORDER — COCONUT OIL OIL
1.0000 "application " | TOPICAL_OIL | Status: DC | PRN
  Administered 2020-02-08: 1 via TOPICAL

## 2020-02-07 MED ORDER — DIBUCAINE (PERIANAL) 1 % EX OINT: 1.0000 "application " | TOPICAL_OINTMENT | CUTANEOUS | Status: DC | PRN

## 2020-02-07 MED ORDER — MAGNESIUM SULFATE 40 GM/1000ML IV SOLN
2.0000 g/h | INTRAVENOUS | Status: DC
  Administered 2020-02-07: 2 g/h via INTRAVENOUS
  Filled 2020-02-07 (×2): qty 1000

## 2020-02-07 MED ORDER — ONDANSETRON HCL 4 MG/2ML IJ SOLN: 4.0000 mg | Freq: Four times a day (QID) | INTRAMUSCULAR | Status: DC | PRN

## 2020-02-07 MED ORDER — SENNOSIDES-DOCUSATE SODIUM 8.6-50 MG PO TABS
2.0000 | ORAL_TABLET | ORAL | Status: DC
  Administered 2020-02-08: 2 via ORAL
  Filled 2020-02-07: qty 2

## 2020-02-07 MED ORDER — PRENATAL MULTIVITAMIN CH
1.0000 | ORAL_TABLET | Freq: Every day | ORAL | Status: DC
  Administered 2020-02-08: 1 via ORAL
  Filled 2020-02-07: qty 1

## 2020-02-07 MED ORDER — VANCOMYCIN HCL IN DEXTROSE 1-5 GM/200ML-% IV SOLN
1000.0000 mg | Freq: Two times a day (BID) | INTRAVENOUS | Status: DC
  Filled 2020-02-07: qty 200

## 2020-02-07 MED ORDER — BENZOCAINE-MENTHOL 20-0.5 % EX AERO: 1.0000 "application " | INHALATION_SPRAY | CUTANEOUS | Status: DC | PRN

## 2020-02-07 MED ORDER — OXYTOCIN-SODIUM CHLORIDE 30-0.9 UT/500ML-% IV SOLN
2.5000 [IU]/h | INTRAVENOUS | Status: DC
  Filled 2020-02-07: qty 500

## 2020-02-07 MED ORDER — LIDOCAINE HCL (PF) 1 % IJ SOLN
INTRAMUSCULAR | Status: DC | PRN
  Administered 2020-02-07: 5 mL via EPIDURAL

## 2020-02-07 MED ORDER — SIMETHICONE 80 MG PO CHEW: 80.0000 mg | CHEWABLE_TABLET | ORAL | Status: DC | PRN

## 2020-02-07 MED ORDER — LIDOCAINE HCL (PF) 1 % IJ SOLN: 30.0000 mL | INTRAMUSCULAR | Status: DC | PRN

## 2020-02-07 NOTE — H&P (Addendum)
Pt seen by CNM while I was in C-section and then I assessed this patient known to me from Prenatal care.   OB ADMISSION/ HISTORY & PHYSICAL:  Admission Date: 02/07/2020  2:36 AM  Admit Diagnosis: 24+6 weeks preterm labor  Allergies: Aspirin (hives, swelling), PCN (rash), Latex, Loratidine  Melissa Diaz is a 29 y.o. female [phone] at 24+6 weeks presenting for contractions since 2 AM, every 2-3 minutes.  No LOF/ VB. +FMs. Patient taking PO Procardia and Intravaginal Prometrium since 6/2 night for incidental short cervix noted at visit for heavy vaginal discharge with slight odor and C/section scar pulling pain  Prenatal History: [id number]   EDC : 05/23/2020 by LMP c/w 1st trim sonogram [facility]atal care at [facility]/ Primary Ob Provider: Dr. Juliene Pina Prenatal course complicated by  - Hx. Of previous LTCS (her 3rd kid) in March 2020 for chorioamnionitis, arrest of descent, CPD - Vaginal Prometrium until 12 weeks due to hx of SAB and low progesterone - Threatened preterm labor and short cervix noted on 02/05/2020: Vaginal Prometrium restarted on 02/05/2020 for short cervix (CL 0.5-0.7cm by sono; VE 1cm/>50%/-3) and Procardia 10mg  2-3 times per day PRN - Obesity - GERD - Complete previa at [redacted]w[redacted]d, but resolved by 19 weeks -H/o 2 term SVDs and then term C/section   Prenatal Labs: ABO, Rh:  O Positive Antibody:  Negative Rubella:   Immune RPR:   NR HBsAg:   Negative HIV:   HR GTT: not performed GBS:   Collected in MAU Hemoglobinopathy: negative AFP-Quad: negative  Anatomy sono- wnl, Girl   Medical / Surgical History :  Past medical history:  Past Medical History:  Diagnosis Date  . Medical history non-contributory   . No pertinent past medical history   . Urticaria      Past surgical history:  Past Surgical History:  Procedure Laterality Date  . CESAREAN SECTION N/A 11/26/2018   Procedure: CESAREAN SECTION;  Surgeon: 11/28/2018, MD;  Location: [facility] LD ORS;  Service: Obstetrics;   Laterality: N/A;  . NO PAST SURGERIES      Family History:  Family History  Problem Relation Age of Onset  . Asthma Brother   . Hypertension Maternal Grandmother   . Arthritis Maternal Grandmother   . Stroke Paternal Grandmother      Social History:  reports that she has never smoked. She has never used smokeless tobacco. She reports that she does not drink alcohol or use drugs.   Allergies: Aspirin, Penicillins, Latex, and Loratadine    Current Medications at time of admission:  Prior to Admission medications   Medication Sig Start Date End Date Taking? Authorizing Provider  acetaminophen (TYLENOL) 500 MG tablet Take 500 mg by mouth every 6 (six) hours as needed.   Yes [provider]  cetirizine (ZYRTEC) 10 MG tablet Take 10 mg by mouth at bedtime.   Yes [provider]  doxylamine, Sleep, (UNISOM) 25 MG tablet Take 25 mg by mouth at bedtime as needed.   Yes [provider]  lansoprazole (PREVACID) 30 MG capsule Take 30 mg by mouth daily at 12 noon.   Yes [provider]  Prenatal Vit-Fe Fumarate-FA (PRENATAL MULTIVITAMIN) TABS tablet Take 1 tablet by mouth daily at 12 noon.   Yes [provider]  albuterol (PROVENTIL HFA;VENTOLIN HFA) 108 (90 Base) MCG/ACT inhaler Inhale 1-2 puffs into the lungs every 6 (six) hours as needed for wheezing or shortness of breath.    [provider]  cyclobenzaprine (FEXMID) 7.5  MG tablet Take 1 tablet (7.5 mg total) by mouth 3 (three) times daily as needed for muscle spasms. 11/29/18   Artelia Laroche, CNM  iron polysaccharides (NIFEREX) 150 MG capsule Take 1 capsule (150 mg total) by mouth daily. 11/29/18   Artelia Laroche, CNM  magnesium oxide (MAG-OX) 400 (241.3 Mg) MG tablet Take 1 tablet (400 mg total) by mouth daily. 11/29/18   Artelia Laroche, CNM  oxyCODONE (OXY IR/ROXICODONE) 5 MG immediate release tablet Take 1 tablet (5 mg total) by mouth every 6 (six) hours as needed for moderate pain. 11/29/18    Artelia Laroche, CNM   Review of Systems: Active FM onset of ctx @ 0200 currently every 4-5 minutes No LOF  / SROM  No bloody show (+) increase in discharge/mucus plug   Physical Exam: VS: Blood pressure 123/76, pulse (!) 121, temperature 98.7 F (37.1 C), temperature source Oral, resp. rate 20, height 5\' 1"  (1.549 m), weight 112 kg, SpO2 100 %, unknown if currently breastfeeding.  General: alert and oriented, grimacing with contractions Heart: RRR Lungs: Clear lung fields Abdomen: Gravid, soft and non-tender, non-distended / uterus: non-tender, contractions palpating moderate Extremities: no  edema Genitalia / VE: Dilation: 4.5 Effacement (%): 100 Exam by:: Joaquim Lai, CNM Repeat exam will be performed later by me but defer now due to risk of ROM  FHR: baseline rate 165-170 bpm / variability moderate / accelerations no / occasional variable decelerations Toco: difficult to trace by monitor; per patient every 2-4 minutes at admission Vertex by sono confirmed by MAU provider  Assessment: 24+[redacted] weeks gestation, active preterm labor, FHR category 1  Plan:      Admit to Labor room for observation since likely will progress to delivery, transfer to Antepartum if remains stable     - s/p BMZ x 1 dose now and repeat in 24hr if pregnant    - Magnesium sulfate 4 gram bolus now, then 2 grams/hr for neuro prophylaxis and will help preterm labor.  --- Cannot use Indocin due to allergy to Aspirin causing hives and swelling     - Pain management: Fentanyl 50-115mcg every 1 hr PRN; epidural upon request    - Ancef 2 grams now for unkown GBS, repeat every 8 hrs until we have GBS report     - NICU notified; okay for delivery of baby here, but may need to be transferred due to high census     - Anticipate Preterm delivery -- MOD discussed- RC/s vs TOLAC. Prior 2 SVDs and then C-section for larger baby. Now preterm, can consider TOLAC since pelvis tested for 7 lbs SVD. Scar dehiscence risk, 2 layer  closure but LTCS was in Mar'20 (15 months). Pt open to vaginal delivery if progresses rapidly  -- Advised against tubal ligation with being so preterm and known prognosis, with possible risk of regret if baby didn't do well. Will discuss other BC options postpartum Pt was first seen by CNM Sigmon who wrote this note and I saw patient after and modified this note.  --Azucena Fallen MD

## 2020-02-07 NOTE — MAU Provider Note (Signed)
MSE done d/t MAU provider being in emergency CS along w/Dr. Juliene Pina  Pt calm, wincing w/ctx.  cx 5/100/BBOW.  Ctx q 2-3 minutes per pt.  FHR reassuring for GA.  Dr. Juliene Pina notified.  Ordered MgSO4/BMZ/admission. NICU in OR w/CS, will notifiy ASAP. Dr Crissie Reese arrived and assumed care.

## 2020-02-07 NOTE — Progress Notes (Signed)
Fentanyl x 3 doses every hr. Now feels a vag pop and drainage  BP 121/70   Pulse (!) 108   Temp 98.3 F (36.8 C) (Oral)   Resp 18   Ht 5\' 1"  (1.549 m)   Wt 112 kg   SpO2 99%   BMI 46.67 kg/m  FHT 150s mod variab (for 25 wk), + accels, rare variable decels  Toco not picked up well on monitor but pt reports getting closer again every 5-6 min  SVE 9/90%/-2/ Vx/ BBOW  S/p BTMZ 1st dose at 4 am and Magnesium bolus f/by 2 gm/hr since. Ancef for unkn GBS( was positive last preg). Epidural now  Anticipate preterm vag delivery TOLAC v/s RC/s risks/benefits/ complications discussed, pt says do whatever is best for baby. At this point if baby tolerated labor, will be fine with vaginal trial due to h/o vag delivery x 2 (1st kid was vacuum and 3rd degree lac, 2nd pushed 7'6" baby with tear)  Extreme prematurity risks discussed.   V.Shanina Kepple MD

## 2020-02-07 NOTE — MAU Note (Addendum)
Awoke with ctxs at 0200. HAs been loosing mucous plug since last Friday. Started on Nifedipine 10mg  po q8hrs  But told to change to every 12 hrs due to body aches. Also using vaginal prometrium. No bleeding but a lot of d/c. Pain feels like ctx was dilated to 1.5cm on Tues. Had c/s last March 2020

## 2020-02-07 NOTE — Progress Notes (Signed)
Comfortable with epidural  Patient Vitals for the past 24 hrs:  BP Temp Temp src Pulse Resp SpO2 Height Weight  02/07/20 1431 135/69 -- -- (!) 103 -- -- -- --  02/07/20 1400 130/78 -- -- 98 18 -- -- --  02/07/20 1330 124/77 -- -- 98 -- -- -- --  02/07/20 1303 122/75 -- -- (!) 104 18 -- -- --  02/07/20 1231 (!) 108/55 -- -- (!) 102 -- -- -- --  02/07/20 1201 (!) 101/54 -- -- (!) 104 18 -- -- --  02/07/20 1131 (!) 108/59 -- -- (!) 104 -- -- -- --  02/07/20 1100 114/65 98.3 F (36.8 C) -- (!) 109 18 -- -- --  02/07/20 1030 110/62 -- -- (!) 105 -- 100 % -- --  02/07/20 1025 109/62 -- -- (!) 102 -- 100 % -- --  02/07/20 1020 110/65 -- -- (!) 101 -- 100 % -- --  02/07/20 1016 110/67 -- -- (!) 103 -- -- -- --  02/07/20 1011 (!) 121/53 -- -- (!) 109 -- 99 % -- --  02/07/20 1005 110/62 -- -- (!) 112 -- 99 % -- --  02/07/20 1001 (!) 112/53 -- -- (!) 115 18 -- -- --  02/07/20 0957 107/88 -- -- (!) 157 -- 100 % -- --  02/07/20 0953 124/66 -- -- (!) 124 -- -- -- --  02/07/20 0902 121/70 -- -- (!) 108 18 -- -- --  02/07/20 0801 121/79 -- -- (!) 121 18 -- -- --  02/07/20 0730 106/61 98.3 F (36.8 C) Oral (!) 109 18 99 % -- --  02/07/20 0700 (!) 100/56 -- -- (!) 107 14 96 % -- --  02/07/20 0603 (!) 104/56 -- -- (!) 108 14 -- -- --  02/07/20 0601 (!) 94/42 -- -- (!) 109 -- -- -- --  02/07/20 0524 121/67 -- -- (!) 116 17 96 % -- --  02/07/20 0522 121/67 98.4 F (36.9 C) Oral (!) 114 17 98 % -- --  02/07/20 0512 122/64 -- -- (!) 124 17 -- -- --  02/07/20 0459 114/68 99 F (37.2 C) Oral (!) 130 18 100 % -- --  02/07/20 0436 123/76 98.7 F (37.1 C) Oral (!) 121 -- 100 % -- --  02/07/20 0305 -- -- -- (!) 128 -- 98 % -- --  02/07/20 0303 139/84 98.9 F (37.2 C) -- -- 20 -- 5\' 1"  (1.549 m) 112 kg   A&ox3 nml respirations Abd: soft, nt, nd, gravid; obese Cx: initial exam 9 with small anterior rim that was easily reducible, no bag of membranes palpable; suspect this was with a contraction as then  the exam changed to buldging bag and head more at -1; cephalic; +odor noted from vagina LE: tr edema, nt bilat  FHT: 140s, nml variability, occ acceleration Toco: difficulty to appreciate with toco, occ seen and about q 2-64min  A/P: iup at 24.6wga 1. Preterm labor - pt now about 9.5/100/ variable station 0to +1 initially then -1; cephalic; bulging bag noted at times; concern for high station and cord prolapse with srom of buldging bag; nursing had appreciated fluid c/w think meconium about 2 hrs ago and no bludging bag noted then; odor noted and will monitor closely for chorioamnionitis; d/t concern for srom/cord prolapse, will sit patient up and see if can get head engaged, then arom with a good station (slowly); plan NICU in attendance for delivery 2. Fetal status reassuring for gestational age 59. Pre-term -  s/p bmz 0400; 10hr s/p start magnesium sulfate for neuroprotection; NICU consult in progress 4. gbs unknown - ancef 2g q 8 5. Ho ltcs, anticipate vbac 6. obesity

## 2020-02-07 NOTE — Anesthesia Preprocedure Evaluation (Signed)
Anesthesia Evaluation  Patient identified by MRN, date of birth, ID band Patient awake    Reviewed: Allergy & Precautions, NPO status , Patient's Chart, lab work & pertinent test results  Airway Mallampati: II  TM Distance: >3 FB Neck ROM: Full    Dental no notable dental hx. (+) Teeth Intact   Pulmonary neg pulmonary ROS,    Pulmonary exam normal breath sounds clear to auscultation       Cardiovascular negative cardio ROS Normal cardiovascular exam Rhythm:Regular Rate:Normal     Neuro/Psych  Headaches, negative neurological ROS  negative psych ROS   GI/Hepatic negative GI ROS, Neg liver ROS,   Endo/Other  negative endocrine ROS  Renal/GU negative Renal ROS     Musculoskeletal negative musculoskeletal ROS (+)   Abdominal (+) + obese,   Peds  Hematology Lab Results      Component                Value               Date                      WBC                      14.2 (H)            02/07/2020                HGB                      11.1 (L)            02/07/2020                HCT                      35.1 (L)            02/07/2020                MCV                      82.6                02/07/2020                PLT                      242                 02/07/2020             Anesthesia Other Findings   Reproductive/Obstetrics (+) Pregnancy                             Anesthesia Physical Anesthesia Plan  ASA: III  Anesthesia Plan: Epidural   Post-op Pain Management:    Induction:   PONV Risk Score and Plan:   Airway Management Planned:   Additional Equipment:   Intra-op Plan:   Post-operative Plan:   Informed Consent: I have reviewed the patients History and Physical, chart, labs and discussed the procedure including the risks, benefits and alternatives for the proposed anesthesia with the patient or authorized representative who has indicated his/her understanding  and acceptance.     Dental advisory given  Plan Discussed with:   Anesthesia Plan Comments: (24.6wk  G6p3 for LEA Spoke w Dr Benjie Karvonen  about Pre Term plan NICU aware)        Anesthesia Quick Evaluation

## 2020-02-07 NOTE — Anesthesia Procedure Notes (Addendum)
Epidural Patient location during procedure: OB Start time: 02/07/2020 9:40 AM End time: 02/07/2020 9:56 AM  Staffing Anesthesiologist: Trevor Iha, MD Performed: anesthesiologist   Preanesthetic Checklist Completed: patient identified, IV checked, site marked, risks and benefits discussed, surgical consent, monitors and equipment checked, pre-op evaluation and timeout performed  Epidural Patient position: sitting Prep: DuraPrep and site prepped and draped Patient monitoring: continuous pulse ox and blood pressure Approach: midline Location: L3-L4 Injection technique: LOR air  Needle:  Needle type: Tuohy  Needle gauge: 17 G Needle length: 9 cm and 9 Needle insertion depth: 9 cm Catheter type: closed end flexible Catheter size: 19 Gauge Catheter at skin depth: 14 cm Test dose: negative  Assessment Events: blood not aspirated, injection not painful, no injection resistance, no paresthesia and negative IV test  Additional Notes Patient identified. Risks/Benefits/Options discussed with patient including but not limited to bleeding, infection, nerve damage, paralysis, failed block, incomplete pain control, headache, blood pressure changes, nausea, vomiting, reactions to medication both or allergic, itching and postpartum back pain. Confirmed with bedside nurse the patient's most recent platelet count. Confirmed with patient that they are not currently taking any anticoagulation, have any bleeding history or any family history of bleeding disorders. Patient expressed understanding and wished to proceed. All questions were answered. Sterile technique was used throughout the entire procedure. Please see nursing notes for vital signs. Test dose was given through epidural needle and negative prior to continuing to dose epidural or start infusion. Warning signs of high block given to the patient including shortness of breath, tingling/numbness in hands, complete motor block, or any concerning  symptoms with instructions to call for help. Patient was given instructions on fall risk and not to get out of bed. All questions and concerns addressed with instructions to call with any issues.  2  Attempt (S) . Patient tolerated procedure well.

## 2020-02-08 LAB — CBC
HCT: 29.3 % — ABNORMAL LOW (ref 36.0–46.0)
Hemoglobin: 9.2 g/dL — ABNORMAL LOW (ref 12.0–15.0)
MCH: 26.2 pg (ref 26.0–34.0)
MCHC: 31.4 g/dL (ref 30.0–36.0)
MCV: 83.5 fL (ref 80.0–100.0)
Platelets: 255 10*3/uL (ref 150–400)
RBC: 3.51 MIL/uL — ABNORMAL LOW (ref 3.87–5.11)
RDW: 15.1 % (ref 11.5–15.5)
WBC: 15.8 10*3/uL — ABNORMAL HIGH (ref 4.0–10.5)
nRBC: 0 % (ref 0.0–0.2)

## 2020-02-08 MED ORDER — CEFAZOLIN SODIUM-DEXTROSE 2-4 GM/100ML-% IV SOLN
2.0000 g | Freq: Three times a day (TID) | INTRAVENOUS | Status: DC
  Administered 2020-02-08: 2 g via INTRAVENOUS
  Filled 2020-02-08 (×4): qty 100

## 2020-02-08 NOTE — Lactation Note (Signed)
This note was copied from a baby's chart. Lactation Consultation Note  Patient Name: [name] [id number] Date: 02/08/2020 Reason for consult: Follow-up assessment  [phone] - [phone] - I rounded on [name] this morning, and when she saw me, she invited me in and said "I need you!" [name] states that with her previous children she exclusively breast fed, and she feels a bit discouraged when using the pump. She states that she is getting drops out at this time.   I reviewed hand expression with her and noted a big drop of colostrum on her left breast, which she stated was her tougher side.   I then reviewed pump settings and observed that her size 24 flange was too small for her large nipples. I changed her to a size 30 flange; she noted that the 24s were starting to hurt. I also reviewed the pump settings and recommended that she pump 8 times a day for 15 minutes. She stated that she's been pumping consistently for 3 hours.  I spent time reassuring her that she is pumping for stimulation and that her milk will begin to transition in a few days. She has had a good milk supply in the past with no need to supplement her babies; however, she has always directly breast fed and not been reliant on a breast pump.  I answered questions about diet and breast feeding and addressed her concerns about eventually putting baby to breast (she wanted to know if that would be possible).I tried to provide reassurance.  All questions answered at this time.   Maternal Data Has patient been taught Hand Expression?: Yes Does the patient have breastfeeding experience prior to this delivery?: No   Interventions Interventions: Breast feeding basics reviewed;DEBP  Lactation Tools Discussed/Used Tools: Pump;Flanges Flange Size: 30 Breast pump type: Double-Electric Breast Pump Pump Review: Setup, frequency, and cleaning   Consult Status Consult Status: Follow-up Date: 02/09/20 Follow-up type:  In-patient    Walker Shadow 02/08/2020, 9:46 AM

## 2020-02-08 NOTE — Progress Notes (Signed)
Pt c/o h/a - has had h/a off/on since last night, various positions of h/a; now with h/a sine about 0800, feels pain in neck that extends to base of head, also frontal h/a, about 6-7/10; now s/p tylenol 1000mg , no significant improvement yet; nml lochia, ambulating well, no pain with urination  Patient Vitals for the past 24 hrs:  BP Temp Temp src Pulse Resp SpO2  02/08/20 0704 (!) 118/55 97.9 F (36.6 C) Oral 77 20 100 %  02/08/20 0300 106/62 98.2 F (36.8 C) Oral 79 19 98 %  02/07/20 2026 (!) 103/54 98.1 F (36.7 C) Oral (!) 101 20 98 %  02/07/20 1813 120/65 98.1 F (36.7 C) Oral 93 18 99 %  02/07/20 1725 105/70 98.3 F (36.8 C) Oral 96 18 100 %  02/07/20 1701 (!) 110/51 98.3 F (36.8 C) Oral 95 18 --  02/07/20 1645 126/63 -- -- 94 -- --  02/07/20 1631 127/72 -- -- 100 16 --  02/07/20 1616 131/67 -- -- (!) 102 -- --  02/07/20 1601 (!) 144/59 -- -- (!) 128 -- --  02/07/20 1600 -- 98.6 F (37 C) Oral -- -- --  02/07/20 1553 (!) 163/69 -- -- (!) 127 -- --  02/07/20 1531 (!) 143/71 -- -- (!) 116 -- --  02/07/20 1501 133/67 98.3 F (36.8 C) Oral (!) 106 18 --  02/07/20 1431 135/69 -- -- (!) 103 -- --  02/07/20 1400 130/78 -- -- 98 18 --  02/07/20 1330 124/77 -- -- 98 -- --  02/07/20 1303 122/75 -- -- (!) 104 18 --  02/07/20 1231 (!) 108/55 -- -- (!) 102 -- --  02/07/20 1201 (!) 101/54 -- -- (!) 104 18 --  02/07/20 1131 (!) 108/59 -- -- (!) 104 -- --  02/07/20 1100 114/65 98.3 F (36.8 C) -- (!) 109 18 --  02/07/20 1030 110/62 -- -- (!) 105 -- 100 %  02/07/20 1025 109/62 -- -- (!) 102 -- 100 %  02/07/20 1020 110/65 -- -- (!) 101 -- 100 %  02/07/20 1016 110/67 -- -- (!) 103 -- --  02/07/20 1011 (!) 121/53 -- -- (!) 109 -- 99 %  02/07/20 1005 110/62 -- -- (!) 112 -- 99 %  02/07/20 1001 (!) 112/53 -- -- (!) 115 18 --  02/07/20 0957 107/88 -- -- (!) 157 -- 100 %  02/07/20 0953 124/66 -- -- (!) 124 -- --  02/07/20 0902 121/70 -- -- (!) 108 18 --   A&ox3 nml respirations Abd:  soft, nt, nd; fundus firm and below umb LE: no edema, nt bilat  CBC Latest Ref Rng & Units 02/08/2020 02/07/2020 11/27/2018  WBC 4.0 - 10.5 K/uL 15.8(H) 14.2(H) 17.5(H)  Hemoglobin 12.0 - 15.0 g/dL 11/29/2018) 11.1(L) 7.1(L)  Hematocrit 36.0 - 46.0 % 29.3(L) 35.1(L) 23.9(L)  Platelets 150 - 400 K/uL 255 242 191   A/P: ppd1 s/p vbac at 24 wga d/t ptl 1. Doing well - contin care 2. Anemia, acute change with h/o chronic anemia - iron daily, good hydration encouraged 3. Baby girl- NICU, stable per parents 4. Tension h/a - encourage 1000mg  tylenol q6r, drink some caffeine also; also encoruage massage to upper back/neck; allergy to nsaids; also encourage frequent protein

## 2020-02-08 NOTE — Progress Notes (Addendum)
Patient requesting to go home; daughter passed away suddenly this afternoon Doing much better with grief now; has met with chaplain  Temp:  [97.9 F (36.6 C)-98.7 F (37.1 C)] 98.7 F (37.1 C) (06/05 1636) Pulse Rate:  [66-87] 84 (06/05 1636) Resp:  [18-20] 18 (06/05 1636) BP: (106-132)/(55-78) 132/78 (06/05 1636) SpO2:  [98 %-100 %] 100 % (06/05 1636)  A&ox3 nml respirations Abd: soft, nt, nd; fundus firm and belowumb LE: no edema, nt bilat  CBC Latest Ref Rng & Units 02/08/2020 02/07/2020 11/27/2018  WBC 4.0 - 10.5 K/uL 15.8(H) 14.2(H) 17.5(H)  Hemoglobin 12.0 - 15.0 g/dL 9.2(L) 11.1(L) 7.1(L)  Hematocrit 36.0 - 46.0 % 29.3(L) 35.1(L) 23.9(L)  Platelets 150 - 400 K/uL 255 242 191   A/P: ppd 1 s/p vbac at 24.5wga 1. Doing well; ok for d/c home; plan f/u in 1 wk 2. Grieving appropriately; has good support; states that while she doesn't understand why this happened, it is part of God's plan for daughter, knows that she is in His hands now; didn't want daughter to suffer; declines any medication for depression/anxiety, do encourage therapy and pt agrees 3. Anemia - asymptomatic, acute change with h/o chronic anemia 4. Rh pos 5. Neonatal death - s/p comfort care with nursing, chaplain, etc; pt and husband decline autopsy; state that they know that she was so early and that there was a chance baby would pass even despite her doing ok this am; also understand that infecion/chorio is what caused preterm delivery, though uncertain as to why chorio happened as she was not ruptured, will not likely know; gbs was done and results pending; pt and husband express understandign of events that transpired and questions answered

## 2020-02-08 NOTE — Plan of Care (Signed)
Comfort care completed,Pain controlled.Patient understands home care instructions.Vital signs within normal limits.

## 2020-02-08 NOTE — Progress Notes (Signed)
Jona was lying in bed, awake and alert when I arrived. Her husband was bedside. Pt's infant daughter passed and she was appropriately tearful during our visit.  Leavy Cella was very articulate in describing her feelings of knowing it was a part of God's plan but she just wants her here. Their baby was named after her husband's mother who passed away 11 years ago.  She had another loss via miscarriage before she had her now one year old son. However, this was her first girl. As she described her pain she explained she has never had a death this close. She had hope since her daughter was here and was doing okay and then she coded. Her husband was quiet during most of our visit. He said he was okay but admitted when he got home he broke down.  Both were very pleasant and attentive to each other and very appreciative of Chaplain visit. Please page if additional support is needed.  She said they have decided to cremate so that they can each have a part of Malanie (spelling may be incorrect) to be with them through a locket.  Please page if additional support is needed.  I offered presence, listening and empathic support during our visit. Chaplain Elmarie Shiley, MDiv

## 2020-02-08 NOTE — Discharge Summary (Signed)
Postpartum Discharge Summary  Date of Service updated     Patient Name: Melissa Diaz DOB: Jan 13, 1991 MRN: [id number]  Date of admission: 02/07/2020 Delivery date:02/07/2020  Delivering provider: Rhoderick Moody E  Date of discharge: 02/08/2020  Admitting diagnosis: Preterm labor [O60.00] Intrauterine pregnancy: [redacted]w[redacted]d     Secondary diagnosis:  Active Problems:   Preterm labor  Additional problems: chorioamnionitis    Discharge diagnosis: Preterm Pregnancy Delivered, VBAC and Anemia                                              Post partum procedures:none Augmentation: N/A Complications: Intrauterine Inflammation or infection (Chorioamniotis)  Hospital course: Onset of Labor With Vaginal Delivery      29 y.o. yo [id number] at [redacted]w[redacted]d was admitted in Active Labor on 02/07/2020. Patient had an uncomplicated labor course as follows:  Membrane Rupture Time/Date: 3:36 PM ,02/07/2020   Delivery Method:VBAC, Spontaneous  Episiotomy: None  Lacerations:  None  Patient had an uncomplicated postpartum course.  She is ambulating, tolerating a regular diet, passing flatus, and urinating well. Patient is discharged home in stable condition on 02/08/20.  Newborn Data: Birth date:02/07/2020  Birth time:3:50 PM  Gender:Female  Living status: alive Apgars:1 ,1  Weight:840 g   Magnesium Sulfate received: Yes: Neuroprotection BMZ received: Yes Rhophylac:No  Physical exam  Vitals:   02/08/20 0704 02/08/20 0811 02/08/20 1112 02/08/20 1636  BP: (!) 118/55 121/60 124/77 132/78  Pulse: 77 66 87 84  Resp: 20 19 18 18   Temp: 97.9 F (36.6 C) 98.1 F (36.7 C) 98.2 F (36.8 C) 98.7 F (37.1 C)  TempSrc: Oral Oral Oral Oral  SpO2: 100% 100% 100% 100%  Weight:      Height:       General: alert, cooperative and no distress Lochia: appropriate Uterine Fundus: firm Incision: N/A DVT Evaluation: No evidence of DVT seen on physical exam. Labs: Lab Results  Component Value Date   WBC 15.8 (H)  02/08/2020   HGB 9.2 (L) 02/08/2020   HCT 29.3 (L) 02/08/2020   MCV 83.5 02/08/2020   PLT 255 02/08/2020   CMP Latest Ref Rng & Units 11/26/2018  Glucose 70 - 99 mg/dL -  BUN 6 - 20 mg/dL -  Creatinine 11/28/2018 - 6.78 mg/dL 9.38  Sodium 1.01 - 751 mmol/L -  Potassium 3.5 - 5.1 mmol/L -  Chloride 98 - 111 mmol/L -  CO2 22 - 32 mmol/L -  Calcium 8.9 - 10.3 mg/dL -  Total Protein 6.5 - 8.1 g/dL -  Total Bilirubin 0.3 - 1.2 mg/dL -  Alkaline Phos 38 - 025 U/L -  AST 15 - 41 U/L -  ALT 0 - 44 U/L -   Edinburgh Score: Edinburgh Postnatal Depression Scale Screening Tool 02/07/2020  I have been able to laugh and see the funny side of things. (No Data)  I have looked forward with enjoyment to things. -  I have blamed myself unnecessarily when things went wrong. -  I have been anxious or worried for no good reason. -  I have felt scared or panicky for no good reason. -  Things have been getting on top of me. -  I have been so unhappy that I have had difficulty sleeping. -  I have felt sad or miserable. -  I have been so unhappy that I  have been crying. -  The thought of harming myself has occurred to me. Flavia Shipper Postnatal Depression Scale Total -      After visit meds:     Discharge home in stable condition Infant: deceased Infant Whitten Discharge instruction: per After Visit Summary and Postpartum booklet. Activity: Advance as tolerated. Pelvic rest for 6 weeks.  Diet: routine diet Anticipated Birth Control: Unsure Postpartum Appointment:1 week Additional Postpartum F/U: Postpartum Depression checkup 1wk Future Appointments:No future appointments. Follow up Visit: Follow-up Information    Azucena Fallen, MD Follow up.   Specialty: Obstetrics and Gynecology Contact information: [address] [place] [address] [phone]               02/08/2020 Charyl Bigger, MD

## 2020-02-08 NOTE — Lactation Note (Addendum)
This note was copied from a baby's chart. Lactation Consultation Note Baby is 9 hrs old in NICU 24 wks. Mom's 4th baby, Mom BF her 1st child for 2 yrs, 2nd child 6 months d/t reflux stopped early, 3rd child for 1 yr. Mom just weaned her 3rd child in March.  Mom has DEBP set up and has pumped. Mom stated she didn't get anything when she pumped. Discussed w/mom the importance of pumping every 3 hrs.  Mom has DEBP at home, Medela.  Mom has everted nipples. Unable to express colostrum at this time. Gave mom NICU booklet. Reviewed milk storage for NICU baby. Mom talked about her BF her other children.  Lactation brochure given. Encouraged mom to set her clock to wake up and pump. Gave mom some colostrum containers.  Patient Name: [name] [id number] Date: 02/08/2020 Reason for consult: Initial assessment;NICU baby;Infant < 6lbs;Preterm <34wks   Maternal Data Has patient been taught Hand Expression?: Yes Does the patient have breastfeeding experience prior to this delivery?: Yes  Feeding    LATCH Score       Type of Nipple: Everted at rest and after stimulation  Comfort (Breast/Nipple): Soft / non-tender        Interventions Interventions: DEBP;Breast compression;Hand express;Breast massage  Lactation Tools Discussed/Used Tools: Pump Breast pump type: Double-Electric Breast Pump WIC Program: No Pump Review: Setup, frequency, and cleaning;Milk Storage Initiated by:: RN Date initiated:: 02/07/20   Consult Status Consult Status: Follow-up Date: 02/09/20 Follow-up type: In-patient    Mylo Driskill, Diamond Nickel 02/08/2020, 1:32 AM

## 2020-02-08 NOTE — Anesthesia Postprocedure Evaluation (Signed)
Anesthesia Post Note  Patient: Melissa Diaz  Procedure(s) Performed: AN AD HOC LABOR EPIDURAL     Patient location during evaluation: OB High Risk Anesthesia Type: Epidural Level of consciousness: awake and alert Pain management: pain level controlled Vital Signs Assessment: post-procedure vital signs reviewed and stable Respiratory status: spontaneous breathing Cardiovascular status: blood pressure returned to baseline Postop Assessment: no headache, adequate PO intake, patient able to bend at knees, able to ambulate, epidural receding and no apparent nausea or vomiting Comments: C/o of back ache from multiple stick    Last Vitals:  Vitals:   02/08/20 0300 02/08/20 0704  BP: 106/62 (!) 118/55  Pulse: 79 77  Resp: 19 20  Temp: 36.8 C 36.6 C  SpO2: 98% 100%    Last Pain:  Vitals:   02/08/20 0704  TempSrc: Oral  PainSc:    Pain Goal: Patients Stated Pain Goal: 0 (02/07/20 0304)                 Salome Arnt

## 2020-02-09 LAB — CULTURE, BETA STREP (GROUP B ONLY)

## 2020-02-11 LAB — SURGICAL PATHOLOGY

## 2020-03-08 NOTE — Progress Notes (Signed)
Cardiology Office Note:    Date:  03/10/2020   ID:  Melissa Diaz, DOB [birth year], MRN [id number]  PCP:  Elizabeth Palau, FNP  Cardiologist:  No primary care provider on file.  Electrophysiologist:  None   Referring MD: Shea Evans, MD   Chief Complaint  Patient presents with  . Tachycardia    History of Present Illness:    Melissa Diaz is a 29 y.o. female with a history of COVID-19 infection in January 2021, who is 4 weeks postpartum and referred by Dr. Juliene Pina for evaluation of palpitations and tachycardia.  She had a premature delivery on 02/07/2020, and unfortunately her baby died on 02-Mar-2023.  She reported that she was having palpitations during her pregnancy but none since delivery.  However she has been monitoring her heart rate on her watch and has had episodes of both tachycardia and bradycardia.  States that heart rate will go up to the 190s with minimal exertion.  She denies any symptoms with this.  States that heart rate will come down to the 50s when she is resting.  She had an episode of lightheadedness while standing in line at [employer] several weeks ago, where she felt like she was going to pass out.  Resolved on its own.  No episodes since that time.  She denies any syncope.  Denies any dyspnea.  Does report she has been having chest pain since her delivery.  States that pain is brought on by certain movements and is tender to touch.  Has been having some lower extremity edema but has resolved.  No smoking history.  Family history includes maternal grandmother had CABG in 97s.    Past Medical History:  Diagnosis Date  . Complication of anesthesia    Hypotensive  . Medical history non-contributory   . No pertinent past medical history   . Urticaria     Past Surgical History:  Procedure Laterality Date  . CESAREAN SECTION N/A 11/26/2018   Procedure: CESAREAN SECTION;  Surgeon: Shea Evans, MD;  Location: [facility] LD [facility];  Service: Obstetrics;  Laterality: N/A;     Current Medications: Current Meds  Medication Sig  . acetaminophen (TYLENOL) 500 MG tablet Take 500 mg by mouth every 6 (six) hours as needed.  Marland Kitchen albuterol (PROVENTIL HFA;VENTOLIN HFA) 108 (90 Base) MCG/ACT inhaler Inhale 1-2 puffs into the lungs every 6 (six) hours as needed for wheezing or shortness of breath.  . cetirizine (ZYRTEC) 10 MG tablet Take 10 mg by mouth at bedtime.  . Prenatal Vit-Fe Fumarate-FA (PRENATAL MULTIVITAMIN) TABS tablet Take 1 tablet by mouth daily at 12 noon.     Allergies:   Aspirin, Penicillins, Latex, and Loratadine   Social History   Socioeconomic History  . Marital status: Single    Spouse name: Not on file  . Number of children: Not on file  . Years of education: Not on file  . Highest education level: Not on file  Occupational History  . Not on file  Tobacco Use  . Smoking status: Never Smoker  . Smokeless tobacco: Never Used  Vaping Use  . Vaping Use: Never used  Substance and Sexual Activity  . Alcohol use: No  . Drug use: No  . Sexual activity: Not Currently    Birth control/protection: None  Other Topics Concern  . Not on file  Social History Narrative   ** Merged History Encounter **       Social Determinants of Health   Financial Resource Strain:   .  Difficulty of Paying Living Expenses:   Food Insecurity:   . Worried About Programme researcher, broadcasting/film/video in the Last Year:   . Barista in the Last Year:   Transportation Needs:   . Freight forwarder (Medical):   Marland Kitchen Lack of Transportation (Non-Medical):   Physical Activity:   . Days of Exercise per Week:   . Minutes of Exercise per Session:   Stress:   . Feeling of Stress :   Social Connections:   . Frequency of Communication with Friends and Family:   . Frequency of Social Gatherings with Friends and Family:   . Attends Religious Services:   . Active Member of Clubs or Organizations:   . Attends Banker Meetings:   Marland Kitchen Marital Status:      Family  History: The patient's family history includes Arthritis in her maternal grandmother; Asthma in her brother; Hypertension in her maternal grandmother; Stroke in her paternal grandmother.  ROS:   Please see the history of present illness.     All other systems reviewed and are negative.  EKGs/Labs/Other Studies Reviewed:    The following studies were reviewed today:   EKG:  EKG is  ordered today.  The ekg ordered today demonstrates normal sinus rhythm with sinus arrhythmia, no ST/T abnormalities  Recent Labs: 02/08/2020: Hemoglobin 9.2; Platelets 255  Recent Lipid Panel No results found for: CHOL, TRIG, HDL, CHOLHDL, VLDL, LDLCALC, LDLDIRECT  Physical Exam:    VS:  BP 115/73   Pulse 82   Temp (!) 97.2 F (36.2 C)   Ht 5\' 1"  (1.549 m)   Wt 244 lb 12.8 oz (111 kg)   SpO2 99%   BMI 46.25 kg/m     Wt Readings from Last 3 Encounters:  03/10/20 244 lb 12.8 oz (111 kg)  02/07/20 247 lb (112 kg)  11/26/18 250 lb (113.4 kg)     GEN: Well nourished, well developed in no acute distress HEENT: Normal NECK: No JVD; No carotid bruits LYMPHATICS: No lymphadenopathy CARDIAC: RRR, 2/6 systolic heart murmur loudest at LUSB RESPIRATORY:  Clear to auscultation without rales, wheezing or rhonchi  ABDOMEN: Soft, non-tender, non-distended MUSCULOSKELETAL:  No edema; No deformity  SKIN: Warm and dry NEUROLOGIC:  Alert and oriented x 3 PSYCHIATRIC:  Normal affect   ASSESSMENT:    1. Tachycardia   2. Palpitations   3. Heart murmur   4. Chest pain of uncertain etiology    PLAN:    Palpitations/tachycardia: will evaluate for arrhythmia with Zio patch x7 days.  Heart murmur: 2 out of 6 systolic murmur loudest at LUSB.  Will check echocardiogram  Chest pain: Description suggests noncardiac etiology, likely musculoskeletal pain as brought on by certain movements and tender to palpation.  No further cardiac work-up recommended at this time  RTC in 3 months   Medication  Adjustments/Labs and Tests Ordered: Current medicines are reviewed at length with the patient today.  Concerns regarding medicines are outlined above.  Orders Placed This Encounter  Procedures  . LONG TERM MONITOR (3-14 DAYS)  . EKG 12-Lead  . ECHOCARDIOGRAM COMPLETE   No orders of the defined types were placed in this encounter.   Patient Instructions  Your physician recommends that you continue on your current medications as directed. Please refer to the Current Medication list given to you today.  Testing/Procedures: Your physician has requested that you have an echocardiogram. Echocardiography is a painless test that uses sound waves to create images of your  heart. It provides your doctor with information about the size and shape of your heart and how well your heart's chambers and valves are working. This procedure takes approximately one hour. There are no restrictions for this procedure.  [facility]:  [address]   ZIO XT- Long Term Monitor Instructions   Your physician has requested you wear your ZIO patch monitor 7 days.   This is a single patch monitor.  [employer] supplies one patch monitor per enrollment.  Additional stickers are not available.   Please do not apply patch if you will be having a Nuclear Stress Test, Echocardiogram, Cardiac CT, MRI, or Chest Xray during the time frame you would be wearing the monitor. The patch cannot be worn during these tests.  You cannot remove and re-apply the ZIO XT patch monitor.   Your ZIO patch monitor will be sent USPS Priority mail from [facility] directly to your home address. The monitor may also be mailed to a PO BOX if home delivery is not available.   It may take 3-5 days to receive your monitor after you have been enrolled.   Once you have received you monitor, please review enclosed instructions.  Your monitor has already been registered assigning a specific  monitor serial # to you.   Applying the monitor   Shave hair from upper left chest.   Hold abrader disc by orange tab.  Rub abrader in 40 strokes over left upper chest as indicated in your monitor instructions.   Clean area with 4 enclosed alcohol pads .  Use all pads to assure are is cleaned thoroughly.  Let dry.   Apply patch as indicated in monitor instructions.  Patch will be place under collarbone on left side of chest with arrow pointing upward.   Rub patch adhesive wings for 2 minutes.Remove white label marked "1".  Remove white label marked "2".  Rub patch adhesive wings for 2 additional minutes.   While looking in a mirror, press and release button in center of patch.  A small green light will flash 3-4 times .  This will be your only indicator the monitor has been turned on.     Do not shower for the first 24 hours.  You may shower after the first 24 hours.   Press button if you feel a symptom. You will hear a small click.  Record Date, Time and Symptom in the Patient Log Book.   When you are ready to remove patch, follow instructions on last 2 pages of Patient Log Book.  Stick patch monitor onto last page of Patient Log Book.   Place Patient Log Book in [place] box.  Use locking tab on box and tape box closed securely.  The [employer] and [employer] box has [employer] Cop[employer]aid postage on it.  Please place in mailbox as soon as possible.  Your physician should have your test results approximately 7 days after the monitor has been mailed back to [facility].   Call [facility] Customer Care at [phone] if you have questions regarding your ZIO XT patch monitor.  Call them immediately if you see an orange light blinking on your monitor.   If your monitor falls off in less than 4 days contact our Monitor department at [phone].  If your monitor becomes loose or falls off after 4 days call [employer] at [phone] for suggestions on securing your monitor.    Follow-Up: At [facility],  you and your health needs are our priority.  As part of our continuing mission to provide you with exceptional heart care, we have created designated Provider Care Teams.  These Care Teams include your primary Cardiologist (physician) and Advanced Practice Providers (APPs -  Physician Assistants and Nurse Practitioners) who all work together to provide you with the care you need, when you need it.  We recommend signing up for the patient portal called "MyChart".  Sign up information is provided on this After Visit Summary.  MyChart is used to connect with patients for Virtual Visits (Telemedicine).  Patients are able to view lab/test results, encounter notes, upcoming appointments, etc.  Non-urgent messages can be sent to your provider as well.   To learn more about what you can do with MyChart, go to [email or web address].    Your next appointment:   3 month(s)  The format for your next appointment:   In Person  Provider:   Epifanio Lesches, MD        Signed, Little Ishikawa, MD  03/10/2020 12:20 PM    [facility]

## 2020-03-10 ENCOUNTER — Encounter: Payer: Self-pay | Admitting: *Deleted

## 2020-03-10 ENCOUNTER — Other Ambulatory Visit: Payer: Self-pay

## 2020-03-10 ENCOUNTER — Ambulatory Visit (INDEPENDENT_AMBULATORY_CARE_PROVIDER_SITE_OTHER): Payer: 59 | Admitting: Cardiology

## 2020-03-10 VITALS — BP 115/73 | HR 82 | Temp 97.2°F | Ht 61.0 in | Wt 244.8 lb

## 2020-03-10 DIAGNOSIS — R000 Tachycardia, unspecified: Secondary | ICD-10-CM | POA: Diagnosis not present

## 2020-03-10 DIAGNOSIS — R079 Chest pain, unspecified: Secondary | ICD-10-CM

## 2020-03-10 DIAGNOSIS — R011 Cardiac murmur, unspecified: Secondary | ICD-10-CM

## 2020-03-10 DIAGNOSIS — R002 Palpitations: Secondary | ICD-10-CM

## 2020-03-10 NOTE — Patient Instructions (Signed)
Your physician recommends that you continue on your current medications as directed. Please refer to the Current Medication list given to you today.  Testing/Procedures: Your physician has requested that you have an echocardiogram. Echocardiography is a painless test that uses sound waves to create images of your heart. It provides your doctor with information about the size and shape of your heart and how well your heart's chambers and valves are working. This procedure takes approximately one hour. There are no restrictions for this procedure.  This will be done at our [facility] location:  [address]   ZIO XT- Long Term Monitor Instructions   Your physician has requested you wear your ZIO patch monitor 7 days.   This is a single patch monitor.  [employer] supplies one patch monitor per enrollment.  Additional stickers are not available.   Please do not apply patch if you will be having a Nuclear Stress Test, Echocardiogram, Cardiac CT, MRI, or Chest Xray during the time frame you would be wearing the monitor. The patch cannot be worn during these tests.  You cannot remove and re-apply the ZIO XT patch monitor.   Your ZIO patch monitor will be sent USPS Priority mail from [facility] directly to your home address. The monitor may also be mailed to a PO BOX if home delivery is not available.   It may take 3-5 days to receive your monitor after you have been enrolled.   Once you have received you monitor, please review enclosed instructions.  Your monitor has already been registered assigning a specific monitor serial # to you.   Applying the monitor   Shave hair from upper left chest.   Hold abrader disc by orange tab.  Rub abrader in 40 strokes over left upper chest as indicated in your monitor instructions.   Clean area with 4 enclosed alcohol pads .  Use all pads to assure are is cleaned thoroughly.  Let dry.   Apply patch as indicated in monitor  instructions.  Patch will be place under collarbone on left side of chest with arrow pointing upward.   Rub patch adhesive wings for 2 minutes.Remove white label marked "1".  Remove white label marked "2".  Rub patch adhesive wings for 2 additional minutes.   While looking in a mirror, press and release button in center of patch.  A small green light will flash 3-4 times .  This will be your only indicator the monitor has been turned on.     Do not shower for the first 24 hours.  You may shower after the first 24 hours.   Press button if you feel a symptom. You will hear a small click.  Record Date, Time and Symptom in the Patient Log Book.   When you are ready to remove patch, follow instructions on last 2 pages of Patient Log Book.  Stick patch monitor onto last page of Patient Log Book.   Place Patient Log Book in [place] box.  Use locking tab on box and tape box closed securely.  The [employer] and [employer] has [employer] on it.  Please place in mailbox as soon as possible.  Your physician should have your test results approximately 7 days after the monitor has been mailed back to [facility].   Call [facility] Customer Care at [phone] if you have questions regarding your ZIO XT patch monitor.  Call them immediately if you see an orange light blinking on your monitor.  If your monitor falls off in less than 4 days contact our Monitor department at [phone].  If your monitor becomes loose or falls off after 4 days call [employer] at [phone] for suggestions on securing your monitor.    Follow-Up: At [facility], you and your health needs are our priority.  As part of our continuing mission to provide you with exceptional heart care, we have created designated Provider Care Teams.  These Care Teams include your primary Cardiologist (physician) and Advanced Practice Providers (APPs -  Physician Assistants and Nurse Practitioners) who all work together to provide you with  the care you need, when you need it.  We recommend signing up for the patient portal called "MyChart".  Sign up information is provided on this After Visit Summary.  MyChart is used to connect with patients for Virtual Visits (Telemedicine).  Patients are able to view lab/test results, encounter notes, upcoming appointments, etc.  Non-urgent messages can be sent to your provider as well.   To learn more about what you can do with MyChart, go to [email or web address].    Your next appointment:   3 month(s)  The format for your next appointment:   In Person  Provider:   Epifanio Lesches, MD

## 2020-03-10 NOTE — Progress Notes (Signed)
Patient ID: Melissa Diaz, female   DOB: 11/24/1990, 29 y.o.   MRN: [id number] Patient enrolled for [employer] to ship a 7 day ZIO XT long term holter monitor to her home.

## 2020-03-12 ENCOUNTER — Ambulatory Visit (INDEPENDENT_AMBULATORY_CARE_PROVIDER_SITE_OTHER): Payer: 59

## 2020-03-12 DIAGNOSIS — R002 Palpitations: Secondary | ICD-10-CM

## 2020-03-12 DIAGNOSIS — R000 Tachycardia, unspecified: Secondary | ICD-10-CM | POA: Diagnosis not present

## 2020-04-01 ENCOUNTER — Other Ambulatory Visit (HOSPITAL_COMMUNITY): Payer: 59

## 2020-04-03 ENCOUNTER — Ambulatory Visit (HOSPITAL_COMMUNITY): Payer: 59 | Attending: Cardiovascular Disease

## 2020-04-03 ENCOUNTER — Other Ambulatory Visit: Payer: Self-pay

## 2020-04-03 DIAGNOSIS — R011 Cardiac murmur, unspecified: Secondary | ICD-10-CM | POA: Diagnosis not present

## 2020-04-03 LAB — ECHOCARDIOGRAM COMPLETE
Area-P 1/2: 4.6 cm2
S' Lateral: 3 cm

## 2020-04-08 ENCOUNTER — Telehealth: Payer: Self-pay | Admitting: Cardiology

## 2020-04-08 NOTE — Telephone Encounter (Signed)
Patient is requesting to discuss results from echocardiogram completed on 04/03/20. Please call.

## 2020-04-08 NOTE — Telephone Encounter (Signed)
I would strongly recommend getting the vaccine, as it is safe and effective

## 2020-04-08 NOTE — Telephone Encounter (Signed)
Pt updated with ECHO results and verbalized understanding. Pt also report she is wanting to take the COVID vaccine but scared due to the side effects with your heart. Pt is questioning whether MD feels she is ok to receive vaccine.   Message routed to MD

## 2020-04-15 NOTE — Telephone Encounter (Signed)
Returned call to patient-patient states she got the 1st shot on Friday.  She has some tingling in her lips while she was still at the location.    She went home and woke up at midnight with hives and tingling in her lips and tongue.   She was fatigued and achy but all symptoms have resolved.     She has contacted her PCP to discuss the 2nd shot d/t concern for allergic reaction.   Advised to f/u with PCP.  Patient verbalized understanding.

## 2020-05-18 ENCOUNTER — Encounter (HOSPITAL_COMMUNITY): Payer: Self-pay

## 2020-05-18 ENCOUNTER — Inpatient Hospital Stay (HOSPITAL_COMMUNITY): Admit: 2020-05-18 | Payer: 59 | Admitting: Obstetrics & Gynecology

## 2020-05-18 SURGERY — Surgical Case
Anesthesia: Spinal | Laterality: Bilateral

## 2020-06-05 ENCOUNTER — Encounter (HOSPITAL_COMMUNITY): Payer: Self-pay | Admitting: *Deleted

## 2020-06-05 ENCOUNTER — Ambulatory Visit (HOSPITAL_COMMUNITY)
Admission: EM | Admit: 2020-06-05 | Discharge: 2020-06-05 | Disposition: A | Discharge status: Home / Self Care | Payer: 59 | Attending: Family Medicine | Admitting: Family Medicine

## 2020-06-05 ENCOUNTER — Other Ambulatory Visit: Payer: Self-pay

## 2020-06-05 DIAGNOSIS — M654 Radial styloid tenosynovitis [de Quervain]: Secondary | ICD-10-CM

## 2020-06-05 DIAGNOSIS — R030 Elevated blood-pressure reading, without diagnosis of hypertension: Secondary | ICD-10-CM

## 2020-06-05 MED ORDER — DICLOFENAC SODIUM 75 MG PO TBEC
75.0000 mg | DELAYED_RELEASE_TABLET | Freq: Two times a day (BID) | ORAL | 0 refills | Status: AC
Start: 2020-06-05 — End: ?

## 2020-06-05 NOTE — ED Provider Notes (Signed)
[place]   [id number] 06/05/20 Arrival Time: 0815  ASSESSMENT & PLAN:  1. De Quervain's tenosynovitis, right   2. Elevated blood pressure reading without diagnosis of hypertension     No indication for plain imaging at this time. No injury/trauma.   Meds ordered this encounter  Medications  . diclofenac (VOLTAREN) 75 MG EC tablet    Sig: Take 1 tablet (75 mg total) by mouth 2 (two) times daily.    Dispense:  14 tablet    Refill:  0    Orders Placed This Encounter  Procedures  . Apply Thumb spica  To wear for at least one week. Remove for bathing.  Recommend:    Follow-up Information    [place].   Why: If worsening or failing to improve as anticipated. Contact information: [address] [place] [phone]               Discharge Instructions     Your blood pressure was noted to be elevated during your visit today. If you are currently taking medication for high blood pressure, please ensure you are taking this as directed. If you do not have a history of high blood pressure and your blood pressure remains persistently elevated, you may need to begin taking a medication at some point. You may return here within the next few days to recheck if unable to see your primary care provider or if do not have a one.  BP (!) 142/111 (BP Location: Left Arm)   Pulse 92   Temp (!) 97.5 F (36.4 C) (Temporal)   Resp 16   SpO2 100%        Reviewed expectations re: course of current medical issues. Questions answered. Outlined signs and symptoms indicating need for more acute intervention. Patient verbalized understanding. After Visit Summary given.  SUBJECTIVE: History from: patient. Natasha Bernard is a 29 y.o. female who reports fairly persistent moderate pain of her right proximal thumb/hand; described as aching with occasional sharp pain with certain movements; without  radiation. Onset: gradual. First noted: over the past 2-3 days. Injury/trama: no. Symptoms have progressed to a point and plateaued since beginning. Aggravating factors: certain movements. Alleviating factors: have not been identified. Associated symptoms: none reported. Extremity weakness: none. Occasional "tingling feeling" sensation around area.  History reviewed. No pertinent surgical history.   Increased blood pressure noted today. Reports that she has not been treated for hypertension in the past. No symptoms..  OBJECTIVE:  Vitals:   06/05/20 0945  BP: (!) 142/111  Pulse: 92  Resp: 16  Temp: (!) 97.5 F (36.4 C)  TempSrc: Temporal  SpO2: 100%    General appearance: alert; no distress HEENT: [place]; AT Neck: supple with FROM Resp: unlabored respirations Extremities: . RUE: warm with well perfused appearance; pain reported over radial side of wrist, more notable with thumb and wrist movement; no erythema or inflammation; no swelling; FROM; very tender over radial styloid CV: brisk extremity capillary refill of RUE; 2+ radial pulse of RUE. Skin: warm and dry; no visible rashes Neurologic: normal sensation and strength of RUE Psychological: alert and cooperative; normal mood and affect    Allergies  Allergen Reactions  . Coconut Oil Swelling and Rash  . Pineapple Rash    Past Medical History:  Diagnosis Date  . Septate uterus   . Uterine fibroids affecting pregnancy    Social History   Socioeconomic History  . Marital status: Single  Spouse name: Not on file  . Number of children: Not on file  . Years of education: Not on file  . Highest education level: Not on file  Occupational History  . Not on file  Tobacco Use  . Smoking status: Never Smoker  . Smokeless tobacco: Never Used  Vaping Use  . Vaping Use: Never used  Substance and Sexual Activity  . Alcohol use: Yes    Comment: Social   . Drug use: No  . Sexual activity: Yes    Comment: Dec 22   Other Topics Concern  . Not on file  Social History Narrative  . Not on file   Social Determinants of Health   Financial Resource Strain:   . Difficulty of Paying Living Expenses: Not on file  Food Insecurity:   . Worried About Charity fundraiser in the Last Year: Not on file  . Ran Out of Food in the Last Year: Not on file  Transportation Needs:   . Lack of Transportation (Medical): Not on file  . Lack of Transportation (Non-Medical): Not on file  Physical Activity:   . Days of Exercise per Week: Not on file  . Minutes of Exercise per Session: Not on file  Stress:   . Feeling of Stress : Not on file  Social Connections:   . Frequency of Communication with Friends and Family: Not on file  . Frequency of Social Gatherings with Friends and Family: Not on file  . Attends Religious Services: Not on file  . Active Member of Clubs or Organizations: Not on file  . Attends Archivist Meetings: Not on file  . Marital Status: Not on file   Family History  Problem Relation Age of Onset  . Cancer Maternal Grandfather   . Diabetes Maternal Grandfather   . Hypertension Maternal Grandfather   . Asthma Neg Hx   . Birth defects Neg Hx   . COPD Neg Hx   . Heart disease Neg Hx   . Stroke Neg Hx    History reviewed. No pertinent surgical history.    Vanessa Kick, MD 06/05/20 1042

## 2020-06-05 NOTE — Discharge Instructions (Addendum)
Your blood pressure was noted to be elevated during your visit today. If you are currently taking medication for high blood pressure, please ensure you are taking this as directed. If you do not have a history of high blood pressure and your blood pressure remains persistently elevated, you may need to begin taking a medication at some point. You may return here within the next few days to recheck if unable to see your primary care provider or if do not have a one.  BP (!) 142/111 (BP Location: Left Arm)   Pulse 92   Temp (!) 97.5 F (36.4 C) (Temporal)   Resp 16   SpO2 100%

## 2020-06-05 NOTE — ED Triage Notes (Signed)
Patient in with complaints of right hand injury x 3 days. Pain is noted between thumb and index finger and goes up arm to elbow. Swelling noted.  Patient denies any recent injury. Patient describes the pain as a throbbing sensation. Tingling to right hand noted on yesterday.

## 2020-06-22 NOTE — Progress Notes (Deleted)
Cardiology Office Note:    Date:  06/22/2020   ID:  Melissa Diaz, DOB 17-Oct-1990, MRN [id number]  PCP:  Elizabeth Palau, FNP  Cardiologist:  No primary care provider on file.  Electrophysiologist:  None   Referring MD: Elizabeth Palau, FNP   No chief complaint on file.   History of Present Illness:    Melissa Diaz is a 29 y.o. female with a history of COVID-19 infection in January 2021, who presents for follow-up.  She was referred by Dr. Juliene Pina for evaluation of palpitations and tachycardia, initially seen on March 10, 2020.  She had a premature delivery on 02/07/2020, and unfortunately her baby died on 13-Feb-2023.  She reported that she was having palpitations during her pregnancy but none since delivery.  However she has been monitoring her heart rate on her watch and has had episodes of both tachycardia and bradycardia.  States that heart rate will go up to the 190s with minimal exertion.  She denies any symptoms with this.  States that heart rate will come down to the 50s when she is resting.  She had an episode of lightheadedness while standing in line at [employer] several weeks ago, where she felt like she was going to pass out.  Resolved on its own.  No episodes since that time.  She denies any syncope.  Denies any dyspnea.  Does report she has been having chest pain since her delivery.  States that pain is brought on by certain movements and is tender to touch.  Has been having some lower extremity edema but has resolved.  No smoking history.  Family history includes maternal grandmother had CABG in 5s.  Echocardiogram on April 03, 2020 showed normal biventricular function, no significant valvular disease.  Zio patch x7 days on April 02, 2020 showed no significant arrhythmias.  Since last clinic visit,  Past Medical History:  Diagnosis Date  . Complication of anesthesia    Hypotensive  . Medical history non-contributory   . No pertinent past medical history   . Urticaria      Past Surgical History:  Procedure Laterality Date  . CESAREAN SECTION N/A 11/26/2018   Procedure: CESAREAN SECTION;  Surgeon: Shea Evans, MD;  Location: [facility];  Service: Obstetrics;  Laterality: N/A;    Current Medications: No outpatient medications have been marked as taking for the 06/26/20 encounter (Appointment) with Little Ishikawa, MD.     Allergies:   Aspirin, Penicillins, Latex, and Loratadine   Social History   Socioeconomic History  . Marital status: Single    Spouse name: Not on file  . Number of children: Not on file  . Years of education: Not on file  . Highest education level: Not on file  Occupational History  . Not on file  Tobacco Use  . Smoking status: Never Smoker  . Smokeless tobacco: Never Used  Vaping Use  . Vaping Use: Never used  Substance and Sexual Activity  . Alcohol use: No  . Drug use: No  . Sexual activity: Not Currently    Birth control/protection: None  Other Topics Concern  . Not on file  Social History Narrative   ** Merged History Encounter **       Social Determinants of Health   Financial Resource Strain:   . Difficulty of Paying Living Expenses: Not on file  Food Insecurity:   . Worried About Programme researcher, broadcasting/film/video in the Last Year: Not on file  . Ran Out  of Food in the Last Year: Not on file  Transportation Needs:   . Lack of Transportation (Medical): Not on file  . Lack of Transportation (Non-Medical): Not on file  Physical Activity:   . Days of Exercise per Week: Not on file  . Minutes of Exercise per Session: Not on file  Stress:   . Feeling of Stress : Not on file  Social Connections:   . Frequency of Communication with Friends and Family: Not on file  . Frequency of Social Gatherings with Friends and Family: Not on file  . Attends Religious Services: Not on file  . Active Member of Clubs or Organizations: Not on file  . Attends Banker Meetings: Not on file  . Marital Status: Not on  file     Family History: The patient's family history includes Arthritis in her maternal grandmother; Asthma in her brother; Hypertension in her maternal grandmother; Stroke in her paternal grandmother.  ROS:   Please see the history of present illness.     All other systems reviewed and are negative.  EKGs/Labs/Other Studies Reviewed:    The following studies were reviewed today:   EKG:  EKG is  ordered today.  The ekg ordered today demonstrates normal sinus rhythm with sinus arrhythmia, no ST/T abnormalities  Recent Labs: 02/08/2020: Hemoglobin 9.2; Platelets 255  Recent Lipid Panel No results found for: CHOL, TRIG, HDL, CHOLHDL, VLDL, LDLCALC, LDLDIRECT  Physical Exam:    VS:  There were no vitals taken for this visit.    Wt Readings from Last 3 Encounters:  03/10/20 244 lb 12.8 oz (111 kg)  02/07/20 247 lb (112 kg)  11/26/18 250 lb (113.4 kg)     GEN: Well nourished, well developed in no acute distress HEENT: Normal NECK: No JVD; No carotid bruits LYMPHATICS: No lymphadenopathy CARDIAC: RRR, 2/6 systolic heart murmur loudest at LUSB RESPIRATORY:  Clear to auscultation without rales, wheezing or rhonchi  ABDOMEN: Soft, non-tender, non-distended MUSCULOSKELETAL:  No edema; No deformity  SKIN: Warm and dry NEUROLOGIC:  Alert and oriented x 3 PSYCHIATRIC:  Normal affect   ASSESSMENT:    No diagnosis found. PLAN:    Palpitations/tachycardia:  Zio patch x7 days on April 02, 2020 showed no significant arrhythmias.   Heart murmur: 2 out of 6 systolic murmur loudest at LUSB.  Echocardiogram on April 03, 2020 showed normal biventricular function, no significant valvular disease.   Chest pain: Description suggests noncardiac etiology, likely musculoskeletal pain as brought on by certain movements and tender to palpation.  No further cardiac work-up recommended at this time  RTC in ***   Medication Adjustments/Labs and Tests Ordered: Current medicines are reviewed at  length with the patient today.  Concerns regarding medicines are outlined above.  No orders of the defined types were placed in this encounter.  No orders of the defined types were placed in this encounter.   There are no Patient Instructions on file for this visit.   Signed, Little Ishikawa, MD  06/22/2020 8:57 PM    [facility]

## 2020-06-26 ENCOUNTER — Ambulatory Visit: Payer: 59 | Admitting: Cardiology

## 2021-08-26 ENCOUNTER — Emergency Department (HOSPITAL_COMMUNITY): Payer: 59

## 2021-08-26 ENCOUNTER — Emergency Department (HOSPITAL_COMMUNITY)
Admission: EM | Admit: 2021-08-26 | Discharge: 2021-08-26 | Disposition: A | Discharge status: Home / Self Care | Payer: 59 | Attending: Emergency Medicine | Admitting: Emergency Medicine

## 2021-08-26 ENCOUNTER — Encounter (HOSPITAL_COMMUNITY): Payer: Self-pay

## 2021-08-26 DIAGNOSIS — R519 Headache, unspecified: Secondary | ICD-10-CM | POA: Diagnosis not present

## 2021-08-26 DIAGNOSIS — R000 Tachycardia, unspecified: Secondary | ICD-10-CM | POA: Diagnosis not present

## 2021-08-26 DIAGNOSIS — N9489 Other specified conditions associated with female genital organs and menstrual cycle: Secondary | ICD-10-CM | POA: Diagnosis not present

## 2021-08-26 DIAGNOSIS — R079 Chest pain, unspecified: Secondary | ICD-10-CM

## 2021-08-26 DIAGNOSIS — R0789 Other chest pain: Secondary | ICD-10-CM | POA: Diagnosis not present

## 2021-08-26 DIAGNOSIS — D72829 Elevated white blood cell count, unspecified: Secondary | ICD-10-CM | POA: Insufficient documentation

## 2021-08-26 DIAGNOSIS — R0602 Shortness of breath: Secondary | ICD-10-CM | POA: Diagnosis not present

## 2021-08-26 DIAGNOSIS — R61 Generalized hyperhidrosis: Secondary | ICD-10-CM | POA: Diagnosis not present

## 2021-08-26 LAB — CBC
HCT: 41.7 % (ref 36.0–46.0)
Hemoglobin: 13.1 g/dL (ref 12.0–15.0)
MCH: 28.7 pg (ref 26.0–34.0)
MCHC: 31.4 g/dL (ref 30.0–36.0)
MCV: 91.4 fL (ref 80.0–100.0)
Platelets: 351 10*3/uL (ref 150–400)
RBC: 4.56 MIL/uL (ref 3.87–5.11)
RDW: 13.8 % (ref 11.5–15.5)
WBC: 12.9 10*3/uL — ABNORMAL HIGH (ref 4.0–10.5)
nRBC: 0 % (ref 0.0–0.2)

## 2021-08-26 LAB — TROPONIN I (HIGH SENSITIVITY)
Troponin I (High Sensitivity): 4 ng/L (ref <18)
Troponin I (High Sensitivity): 3 ng/L (ref <18)

## 2021-08-26 LAB — BASIC METABOLIC PANEL
Anion gap: 9 (ref 5–15)
BUN: 11 mg/dL (ref 6–20)
CO2: 21 mmol/L — ABNORMAL LOW (ref 22–32)
Calcium: 9.1 mg/dL (ref 8.9–10.3)
Chloride: 107 mmol/L (ref 98–111)
Creatinine, Ser: 0.71 mg/dL (ref 0.44–1.00)
GFR, Estimated: >60 mL/min (ref >60)
Glucose, Bld: 98 mg/dL (ref 70–99)
Potassium: 4.1 mmol/L (ref 3.5–5.1)
Sodium: 137 mmol/L (ref 135–145)

## 2021-08-26 LAB — I-STAT BETA HCG BLOOD, ED (MC, WL, AP ONLY): I-stat hCG, quantitative: <5 m[IU]/mL (ref <5)

## 2021-08-26 LAB — D-DIMER, QUANTITATIVE: D-Dimer, Quant: 0.81 ug/mL-FEU — ABNORMAL HIGH (ref 0.00–0.50)

## 2021-08-26 MED ORDER — ACETAMINOPHEN 500 MG PO TABS
1000.0000 mg | ORAL_TABLET | Freq: Once | ORAL | Status: AC
  Administered 2021-08-26: 14:00:00 1000 mg via ORAL
  Filled 2021-08-26: qty 2

## 2021-08-26 MED ORDER — ONDANSETRON HCL 4 MG/2ML IJ SOLN
4.0000 mg | Freq: Once | INTRAMUSCULAR | Status: AC
  Administered 2021-08-26: 14:00:00 4 mg via INTRAVENOUS
  Filled 2021-08-26: qty 2

## 2021-08-26 MED ORDER — ONDANSETRON HCL 4 MG PO TABS: 4.0000 mg | ORAL_TABLET | Freq: Four times a day (QID) | ORAL | 0 refills | Status: AC

## 2021-08-26 MED ORDER — IOHEXOL 350 MG/ML SOLN
60.0000 mL | Freq: Once | INTRAVENOUS | Status: AC | PRN
  Administered 2021-08-26: 15:00:00 60 mL via INTRAVENOUS

## 2021-08-26 NOTE — ED Notes (Signed)
ED Provider at bedside. 

## 2021-08-26 NOTE — ED Provider Notes (Signed)
[facility] EMERGENCY DEPARTMENT Provider Note   CSN: [id number] Arrival date & time: 08/26/21  1211     History Chief Complaint  Patient presents with   Chest Pain    Natasha Bernard is a 30 y.o. female.  Patient with no pertinent past medical history presents today with chief complaint of chest pain.  She states that she had acute onset sharp chest pain in the left side of her chest and she was on the phone this morning at work.  She states she had associated diaphoresis and nausea.  She states the pain does not radiate and is worse on deep inspiration. She states that a coworker gave her aspirin and that gave her some relief of her symptoms however she still endorses pain. She denies any recent illness or pain exacerbated by exertion. No history of similar events or prior cardiac issues. She does state that while she was being transported by EMS she developed a headache located in the center of her head similar to headaches that she normally feels like she gets when her blood pressure is high. She denies fevers, chills, vomiting, diarrhea. She denies leg pain or swelling, history of blood clots, recent travel, recent surgeries, hemoptysis, however she is on Nuvaring for birth control. She does not smoke, and denies recreational or IVDU.  The history is provided by the patient. No language interpreter was used.  Chest Pain Associated symptoms: headache and shortness of breath   Associated symptoms: no abdominal pain, no cough, no dizziness, no fever, no nausea, no numbness, no vomiting and no weakness       Past Medical History:  Diagnosis Date   Septate uterus    Uterine fibroids affecting pregnancy     Patient Active Problem List   Diagnosis Date Noted   Gestational hypertension 08/28/2014   SVD (spontaneous vaginal delivery) 08/28/2014   First degree perineal laceration during delivery 08/28/2014   Elevated blood pressure affecting pregnancy in third  trimester, antepartum 08/27/2014   Uterine septum affecting pregnancy 05/29/2014   Uterine fibroids affecting pregnancy 05/29/2014   GBS bacteriuria 05/29/2014   E. 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   OB History     Gravida  3   Para  1   Term  1   Preterm      AB  2   Living  1      SAB  0   IAB  2   Ectopic      Multiple  0   Live Births  1           Family History  Problem Relation Age of Onset   Cancer Maternal Grandfather    Diabetes Maternal Grandfather    Hypertension Maternal Grandfather    Asthma Neg Hx    Birth defects Neg Hx    COPD Neg Hx    Heart disease Neg Hx    Stroke Neg Hx     Social History   Tobacco Use   Smoking status: Never   Smokeless tobacco: Never  Vaping Use   Vaping Use: Never used  Substance Use Topics   Alcohol use: Yes    Comment: Social    Drug use: No    Home Medications Prior to Admission medications   Medication Sig Start Date End Date Taking? Authorizing Provider  albuterol (  PROVENTIL HFA;VENTOLIN HFA) 108 (90 Base) MCG/ACT inhaler Inhale 1-2 puffs into the lungs every 6 (six) hours as needed for wheezing or shortness of breath. 07/25/18   Loura Halt A, NP  diclofenac (VOLTAREN) 75 MG EC tablet Take 1 tablet (75 mg total) by mouth 2 (two) times daily. 06/05/20   Vanessa Kick, MD  ibuprofen (ADVIL,MOTRIN) 200 MG tablet Take 400 mg by mouth every 6 (six) hours as needed for headache, mild pain or moderate pain.    [provider]  NUVARING 0.12-0.015 MG/24HR vaginal ring Place 1 each vaginally See admin instructions. 07/03/18   [provider]    Allergies    Coconut oil and Pineapple  Review of Systems   Review of Systems  Constitutional:  Negative for chills and fever.  HENT:  Negative for congestion.   Respiratory:  Positive for shortness of breath. Negative for  apnea, cough, choking, chest tightness, wheezing and stridor.   Cardiovascular:  Positive for chest pain.  Gastrointestinal:  Negative for abdominal pain, diarrhea, nausea and vomiting.  Skin:  Negative for rash.  Neurological:  Positive for headaches. Negative for dizziness, tremors, seizures, syncope, facial asymmetry, speech difficulty, weakness, light-headedness and numbness.  Psychiatric/Behavioral:  Negative for confusion and decreased concentration.   All other systems reviewed and are negative.  Physical Exam Updated Vital Signs BP (!) 155/107 (BP Location: Right Arm)    Pulse (!) 103    Temp 97.6 F (36.4 C) (Oral)    Resp 18    Ht 5\' 4"  (1.626 m)    Wt 117.9 kg    SpO2 100%    BMI 44.63 kg/m   Physical Exam Vitals and nursing note reviewed.  Constitutional:      General: She is not in acute distress.    Appearance: She is well-developed. She is obese. She is not ill-appearing, toxic-appearing or diaphoretic.     Comments: Patient resting comfortably in bed in no distress  HENT:     Head: Normocephalic and atraumatic.  Eyes:     Extraocular Movements: Extraocular movements intact.     Pupils: Pupils are equal, round, and reactive to light.  Cardiovascular:     Rate and Rhythm: Regular rhythm. Tachycardia present.     Heart sounds: Normal heart sounds.  Pulmonary:     Effort: Pulmonary effort is normal. No tachypnea or respiratory distress.     Breath sounds: Normal breath sounds.  Abdominal:     Palpations: Abdomen is soft.  Musculoskeletal:        General: Normal range of motion.     Cervical back: Normal range of motion and neck supple.     Right lower leg: No tenderness. No edema.     Left lower leg: No tenderness. No edema.  Skin:    General: Skin is warm and dry.  Neurological:     General: No focal deficit present.     Mental Status: She is alert.  Psychiatric:        Mood and Affect: Mood normal.        Behavior: Behavior normal.    ED Results /  Procedures / Treatments   Labs (all labs ordered are listed, but only abnormal results are displayed) Labs Reviewed  BASIC METABOLIC PANEL - Abnormal; Notable for the following components:      Result Value   CO2 21 (*)    All other components within normal limits  CBC - Abnormal; Notable for the following components:   WBC  12.9 (*)    All other components within normal limits  D-DIMER, QUANTITATIVE - Abnormal; Notable for the following components:   D-Dimer, Quant 0.81 (*)    All other components within normal limits  I-STAT BETA HCG BLOOD, ED ([facility], [facility], [facility] ONLY)  TROPONIN I (HIGH SENSITIVITY)  TROPONIN I (HIGH SENSITIVITY)    EKG None  Radiology DG Chest 2 View  Result Date: 08/26/2021 CLINICAL DATA:  Chest pain. EXAM: CHEST - 2 VIEW COMPARISON:  Radiographs 07/25/2018. FINDINGS: The heart size and mediastinal contours are normal. The lungs are clear. There is no pleural effusion or pneumothorax. No acute osseous findings are identified. Telemetry leads overlie the chest. IMPRESSION: Stable chest.  No active cardiopulmonary process. Electronically Signed   By: Richardean Sale M.D.   On: 08/26/2021 14:02   CT Angio Chest PE W and/or Wo Contrast  Result Date: 08/26/2021 CLINICAL DATA:  Sudden on set upper left chest pain at work. Elevated D-dimer levels. Pulmonary embolism suspected. EXAM: CT ANGIOGRAPHY CHEST WITH CONTRAST TECHNIQUE: Multidetector CT imaging of the chest was performed using the standard protocol during bolus administration of intravenous contrast. Multiplanar CT image reconstructions and MIPs were obtained to evaluate the vascular anatomy. CONTRAST:  79mL OMNIPAQUE IOHEXOL 350 MG/ML SOLN COMPARISON:  Chest radiographs 08/26/2021.  Abdominal CT 03/09/2013. FINDINGS: Cardiovascular: The pulmonary arteries are well opacified with contrast to the level of the subsegmental branches. There is no evidence of acute pulmonary embolism. No systemic arterial abnormalities are  identified. The heart size is normal. There is no pericardial effusion. Mediastinum/Nodes: There are no enlarged mediastinal, hilar or axillary lymph nodes.There is a small amount of residual thymic tissue in the anterior mediastinum. The thyroid gland, trachea and esophagus demonstrate no significant findings. Lungs/Pleura: There is no pleural effusion or pneumothorax. The lungs are clear. Upper abdomen: The visualized upper abdomen appears stable without significant findings. Musculoskeletal/Chest wall: There is no chest wall mass or suspicious osseous finding. Review of the MIP images confirms the above findings. IMPRESSION: Negative chest CTA. No evidence of acute pulmonary embolism or other acute chest process. Electronically Signed   By: Richardean Sale M.D.   On: 08/26/2021 15:31    Procedures Procedures   Medications Ordered in ED Medications - No data to display  ED Course  I have reviewed the triage vital signs and the nursing notes.  Pertinent labs & imaging results that were available during my care of the patient were reviewed by me and considered in my medical decision making (see chart for details).    MDM Rules/Calculators/A&P                         Patient presents today with sudden onset chest pain since this morning with associated nausea and diaphoresis. Given ASA with some relief. Does also state that she has a mild headache which is typical when her blood pressure is high.  Given the large differential diagnosis for Natasha Bernard, the decision making in this case is of high complexity.  After evaluating all of the data points in this case, the presentation of Natasha Bernard is NOT consistent with Acute Coronary Syndrome (ACS) and/or myocardial ischemia, pulmonary embolism, aortic dissection; Borhaave's, significant arrythmia, pneumothorax, cardiac tamponade, or other emergent cardiopulmonary condition.  Further, the presentation of Natasha Bernard is NOT consistent  with pericarditis, myocarditis, mediastinitis, endocarditis, new valvular disease.  Additionally, the presentation of Natasha Bernard NOT consistent with flail chest, cardiac  contusion, ARDS, or significant intra-thoracic or intra-abdominal bleeding.  Moreover, this presentation is NOT consistent with pneumonia or sepsis  Patient with mild leukocytosis at 12.9, no anemia.  No electrolyte abnormalities or changes in kidney function.  Troponin negative.  She is not pregnant.  CXR without active cardiopulmonary process.  EKG sinus tachycardia.  Patient is tachycardic and on NuvaRing for contraception, unable to [facility], therefore ordered D-dimer which is elevated at 0.81.  We will follow-up with CT PE.  CT PE without evidence of acute pulmonary embolism or other acute chest process.   Upon reassessment, patient states that her headache and chest pain has completely resolved.  Her blood pressure has also normalized.  She is afebrile, nontoxic-appearing, and in no acute distress.  Heart score 1.  Therefore feel that patient is stable for discharge at this time.  She states that she has a appointment with her primary care tomorrow, recommend further discussion of hypertension at this appointment.  I will also include referral to cardiology who she can call for further evaluation.  Patient amenable with plan, educated on red flag symptoms of prompt immediate return.  Discharged in stable condition.   Strict return and follow-up precautions have been given by me personally or by detailed written instruction given verbally by nursing staff using the teach back method to the patient/family/caregiver(s).  Data Reviewed/Counseling: I have reviewed the patient's vital signs, nursing notes, and other relevant tests/information. I had a detailed discussion regarding the historical points, exam findings, and any diagnostic results supporting the discharge diagnosis. I also discussed the need for outpatient follow-up  and the need to return to the ED if symptoms worsen or if there are any questions or concerns that arise at home.    Final Clinical Impression(s) / ED Diagnoses Final diagnoses:  Nonspecific chest pain    Rx / DC Orders ED Discharge Orders          Ordered    ondansetron (ZOFRAN) 4 MG tablet  Every 6 hours        08/26/21 1603          An After Visit Summary was printed and given to the patient.    Nestor Lewandowsky 08/26/21 1604    Charlesetta Shanks, MD 08/31/21 [phone]

## 2021-08-26 NOTE — Discharge Instructions (Addendum)
Work-up for your chest pain today was unremarkable for acute abnormalities.  Given that you are young and overall healthy, I feel that you are stable to go home this evening.  Please discuss your blood pressure management at your appointment tomorrow.  I will also include referral to cardiology she can call and schedule an appointment for further evaluation.  I have also given you a prescription for Zofran which she can take for nausea as needed.  Return if development of any new or worsening symptoms.

## 2021-08-26 NOTE — ED Triage Notes (Signed)
At work with sudden onset chest pain upper chest left of center, no radiation, dull, 7/10 at present, increases with deep breathing, intermittent.

## 2022-05-06 DIAGNOSIS — Z3689 Encounter for other specified antenatal screening: Secondary | ICD-10-CM | POA: Diagnosis not present

## 2022-05-06 DIAGNOSIS — Z8759 Personal history of other complications of pregnancy, childbirth and the puerperium: Secondary | ICD-10-CM | POA: Diagnosis not present

## 2022-05-26 DIAGNOSIS — Z3201 Encounter for pregnancy test, result positive: Secondary | ICD-10-CM | POA: Diagnosis not present

## 2022-06-14 DIAGNOSIS — N870 Mild cervical dysplasia: Secondary | ICD-10-CM | POA: Diagnosis not present

## 2022-06-14 DIAGNOSIS — Z3689 Encounter for other specified antenatal screening: Secondary | ICD-10-CM | POA: Diagnosis not present

## 2022-06-14 DIAGNOSIS — R769 Abnormal immunological finding in serum, unspecified: Secondary | ICD-10-CM | POA: Diagnosis not present

## 2022-06-14 LAB — OB RESULTS CONSOLE GC/CHLAMYDIA
Chlamydia: NEGATIVE
Neisseria Gonorrhea: NEGATIVE

## 2022-06-14 LAB — OB RESULTS CONSOLE HEPATITIS B SURFACE ANTIGEN: Hepatitis B Surface Ag: NEGATIVE

## 2022-06-14 LAB — OB RESULTS CONSOLE RUBELLA ANTIBODY, IGM: Rubella: IMMUNE

## 2022-06-14 LAB — OB RESULTS CONSOLE HIV ANTIBODY (ROUTINE TESTING): HIV: NONREACTIVE

## 2022-06-14 LAB — HEPATITIS C ANTIBODY: HCV Ab: NEGATIVE

## 2022-06-14 LAB — OB RESULTS CONSOLE RPR: RPR: NONREACTIVE

## 2022-06-14 LAB — OB RESULTS CONSOLE ANTIBODY SCREEN: Antibody Screen: NEGATIVE

## 2022-07-18 DIAGNOSIS — Z361 Encounter for antenatal screening for raised alphafetoprotein level: Secondary | ICD-10-CM | POA: Diagnosis not present

## 2022-07-18 DIAGNOSIS — Z3A15 15 weeks gestation of pregnancy: Secondary | ICD-10-CM | POA: Diagnosis not present

## 2022-07-19 DIAGNOSIS — O09212 Supervision of pregnancy with history of pre-term labor, second trimester: Secondary | ICD-10-CM | POA: Diagnosis not present

## 2022-07-19 DIAGNOSIS — Z3A15 15 weeks gestation of pregnancy: Secondary | ICD-10-CM | POA: Diagnosis not present

## 2022-07-21 ENCOUNTER — Other Ambulatory Visit: Payer: Self-pay | Admitting: Obstetrics & Gynecology

## 2022-07-21 DIAGNOSIS — O09212 Supervision of pregnancy with history of pre-term labor, second trimester: Secondary | ICD-10-CM

## 2022-07-21 DIAGNOSIS — Z363 Encounter for antenatal screening for malformations: Secondary | ICD-10-CM

## 2022-08-15 DIAGNOSIS — O09212 Supervision of pregnancy with history of pre-term labor, second trimester: Secondary | ICD-10-CM | POA: Diagnosis not present

## 2022-08-15 DIAGNOSIS — Z3A19 19 weeks gestation of pregnancy: Secondary | ICD-10-CM | POA: Diagnosis not present

## 2022-08-15 DIAGNOSIS — Z8759 Personal history of other complications of pregnancy, childbirth and the puerperium: Secondary | ICD-10-CM | POA: Diagnosis not present

## 2022-08-15 DIAGNOSIS — K219 Gastro-esophageal reflux disease without esophagitis: Secondary | ICD-10-CM | POA: Diagnosis not present

## 2022-08-15 DIAGNOSIS — Z8751 Personal history of pre-term labor: Secondary | ICD-10-CM | POA: Diagnosis not present

## 2022-08-15 DIAGNOSIS — Z363 Encounter for antenatal screening for malformations: Secondary | ICD-10-CM | POA: Diagnosis not present

## 2022-08-19 ENCOUNTER — Encounter: Payer: Self-pay | Admitting: Obstetrics & Gynecology

## 2022-08-19 ENCOUNTER — Ambulatory Visit: Payer: Self-pay

## 2022-08-19 ENCOUNTER — Ambulatory Visit: Payer: 59 | Attending: Obstetrics & Gynecology

## 2022-08-19 ENCOUNTER — Other Ambulatory Visit: Payer: Self-pay | Admitting: Obstetrics & Gynecology

## 2022-08-19 ENCOUNTER — Ambulatory Visit: Payer: 59 | Admitting: *Deleted

## 2022-08-19 ENCOUNTER — Ambulatory Visit: Payer: 59 | Attending: Obstetrics and Gynecology | Admitting: Obstetrics and Gynecology

## 2022-08-19 VITALS — BP 117/52 | HR 106

## 2022-08-19 DIAGNOSIS — O09292 Supervision of pregnancy with other poor reproductive or obstetric history, second trimester: Secondary | ICD-10-CM

## 2022-08-19 DIAGNOSIS — O3432 Maternal care for cervical incompetence, second trimester: Secondary | ICD-10-CM

## 2022-08-19 DIAGNOSIS — E668 Other obesity: Secondary | ICD-10-CM | POA: Diagnosis not present

## 2022-08-19 DIAGNOSIS — Z363 Encounter for antenatal screening for malformations: Secondary | ICD-10-CM | POA: Insufficient documentation

## 2022-08-19 DIAGNOSIS — E669 Obesity, unspecified: Secondary | ICD-10-CM | POA: Diagnosis not present

## 2022-08-19 DIAGNOSIS — O99212 Obesity complicating pregnancy, second trimester: Secondary | ICD-10-CM | POA: Insufficient documentation

## 2022-08-19 DIAGNOSIS — O09212 Supervision of pregnancy with history of pre-term labor, second trimester: Secondary | ICD-10-CM | POA: Insufficient documentation

## 2022-08-19 DIAGNOSIS — O34219 Maternal care for unspecified type scar from previous cesarean delivery: Secondary | ICD-10-CM | POA: Diagnosis not present

## 2022-08-19 DIAGNOSIS — Z3A20 20 weeks gestation of pregnancy: Secondary | ICD-10-CM

## 2022-08-19 NOTE — Progress Notes (Addendum)
Maternal-Fetal Medicine   Name: Jontae Sonier DOB: [birth year] MRN: [id number] Referring Provider: Gilford Raid, MD  I had the pleasure of seeing Ms. Achorn [facility] at [facility]. She is G7 P3123 at 20w 2d gestation and is here for ultrasound evaluation. Patient is taking vaginal progesterone and is here to rule out cervical incompetence.  Obstetric history is significant for 2 term vaginal delivery followed by a term cesarean delivery.  In June 2021 she had a preterm delivery at [redacted] weeks gestation of a female infant.  Unfortunately the infant died after birth.  Her pregnancy was complicated by preterm premature rupture of membranes and acute chorioamnionitis.  Patient does not give history of abnormal Pap smears or cervical surgeries. Past medical history: No history of hypertension or diabetes.  She has class III obesity. Past surgical history: Cesarean section. Allergies: Aspirin, latex, loratadine, penicillin. Social history: Denies tobacco or drug or alcohol use.  Prenatal course: Patient had opted not to screen for fetal aneuploidies.  She reports that your office ultrasound performed last week, the cervix measured 3.3 cm.  Ultrasound We performed a fetal anatomical survey.  Amniotic fluid is normal and good fetal activity seen.  No markers of aneuploidies or obvious fetal structural defects are seen.  Fetal biometry is consistent with the previously established dates.  As maternal obesity imposes limitations on the resolution of ultrasound images, fetal anomalies may be missed.  We performed a transvaginal ultrasound to evaluate the cervix.  The shortest cervical measurement was 2.3 cm.  A small rounded echogenic structure was seen at the internal os measuring 9 x 8 x 1.1 millimeters and it could not be displaced on transfundal pressure. It is not consistent with blood clot but appears to be a polyp.  Placenta is anterior and there is no evidence of previa or  placenta accreta spectrum.  Cervical Insufficiency I explained the findings with the help of ultrasound images and diagrams.  Findings are consistent with cervical insufficiency.  Patient has been taking vaginal progesterone for a month.  Cervical insufficiency is associated with increased risk of midtrimester miscarriage or preterm delivery.  History of PPROM and preterm delivery at 24 weeks increase the risk of preterm delivery in this pregnancy.  I discussed the options including cervical cerclage or continue vaginal progesterone for another week and reevaluate next week.  I explained cerclage procedure and possible complications including bleeding, infection, miscarriage, injuries to bladder or bowel (all rare).  I counseled her that cerclage does not guarantee carrying pregnancy to term.  Patient reports that she has been taking vaginal progesterone and feels that significant shortening of cervix warrants cerclage procedure.  She opted to have cerclage.  I reassured her that cerclage can be performed early next week (non-urgent procedure).  I explained that cervical polyp (probable diagnosis) removal will not be attempted during cerclage because of its location.  Discussed with Dr. Ernestina Penna, who will be scheduling cerclage procedure.  Recommendations -Rescue cerclage. -Continue vaginal progesterone after cerclage till 36 weeks' gestation. -Patient prefers to have follow-up anatomy scan at your office. She is aware that fetal anatomical survey was not completed. -Fetal growth assessments every 4 weeks. -Weekly BPP from 34 weeks' gestation till delivery.  Thank you for consultation.  If you have any questions or concerns, please contact me the [facility].  Consultation including face-to-face (more than 50%) counseling 30 minutes.

## 2022-08-22 ENCOUNTER — Encounter (HOSPITAL_COMMUNITY): Payer: Self-pay | Admitting: Anesthesiology

## 2022-08-22 ENCOUNTER — Telehealth (HOSPITAL_COMMUNITY): Payer: Self-pay | Admitting: *Deleted

## 2022-08-22 DIAGNOSIS — O26899 Other specified pregnancy related conditions, unspecified trimester: Secondary | ICD-10-CM | POA: Diagnosis not present

## 2022-08-22 NOTE — Telephone Encounter (Signed)
Preadmission screen  

## 2022-08-22 NOTE — Anesthesia Preprocedure Evaluation (Signed)
Anesthesia Evaluation    Reviewed: Allergy & Precautions, Patient's Chart, lab work & pertinent test results  Airway        Dental   Pulmonary neg pulmonary ROS          Cardiovascular negative cardio ROS      Neuro/Psych  Headaches  negative psych ROS   GI/Hepatic negative GI ROS, Neg liver ROS,,,  Endo/Other    Morbid obesity  Renal/GU negative Renal ROS  negative genitourinary   Musculoskeletal negative musculoskeletal ROS (+)    Abdominal   Peds  Hematology negative hematology ROS (+)   Anesthesia Other Findings   Reproductive/Obstetrics (+) Pregnancy 1 prior section and 2 prior vaginal deliveries w/ epidural- has required multiple attempts on all neuraxial                             Anesthesia Physical Anesthesia Plan  ASA: 3  Anesthesia Plan: Spinal   Post-op Pain Management: Regional block and Ofirmev IV (intra-op)*   Induction:   PONV Risk Score and Plan: 3 and Ondansetron, Dexamethasone and Treatment may vary due to age or medical condition  Airway Management Planned: Natural Airway and Nasal Cannula  Additional Equipment: None  Intra-op Plan:   Post-operative Plan:   Informed Consent:   Plan Discussed with:   Anesthesia Plan Comments:        Anesthesia Quick Evaluation

## 2022-08-23 ENCOUNTER — Inpatient Hospital Stay (HOSPITAL_COMMUNITY): Admission: RE | Admit: 2022-08-23 | Payer: 59 | Source: Home / Self Care | Admitting: Obstetrics & Gynecology

## 2022-08-23 ENCOUNTER — Encounter (HOSPITAL_COMMUNITY): Admission: RE | Payer: Self-pay | Source: Home / Self Care

## 2022-08-23 SURGERY — Surgical Case
Anesthesia: *Unknown

## 2022-08-23 SURGERY — CERCLAGE, CERVIX, VAGINAL APPROACH
Anesthesia: Spinal | Laterality: Bilateral

## 2022-08-30 DIAGNOSIS — O26872 Cervical shortening, second trimester: Secondary | ICD-10-CM | POA: Diagnosis not present

## 2022-08-30 DIAGNOSIS — Z3A21 21 weeks gestation of pregnancy: Secondary | ICD-10-CM | POA: Diagnosis not present

## 2022-09-12 DIAGNOSIS — Z8759 Personal history of other complications of pregnancy, childbirth and the puerperium: Secondary | ICD-10-CM | POA: Diagnosis not present

## 2022-09-12 DIAGNOSIS — Z362 Encounter for other antenatal screening follow-up: Secondary | ICD-10-CM | POA: Diagnosis not present

## 2022-09-12 DIAGNOSIS — O09212 Supervision of pregnancy with history of pre-term labor, second trimester: Secondary | ICD-10-CM | POA: Diagnosis not present

## 2022-09-12 DIAGNOSIS — Z3A23 23 weeks gestation of pregnancy: Secondary | ICD-10-CM | POA: Diagnosis not present

## 2022-09-12 DIAGNOSIS — O479 False labor, unspecified: Secondary | ICD-10-CM | POA: Diagnosis not present

## 2022-09-20 DIAGNOSIS — Z3483 Encounter for supervision of other normal pregnancy, third trimester: Secondary | ICD-10-CM | POA: Diagnosis not present

## 2022-09-20 DIAGNOSIS — Z3482 Encounter for supervision of other normal pregnancy, second trimester: Secondary | ICD-10-CM | POA: Diagnosis not present

## 2022-09-20 IMAGING — CT CT ANGIO CHEST
2 of 6 series · 18 of 46 positions shown · IV contrast (omnipaque)
Comparison: Chest radiographs 08/26/2021.  Abdominal CT 03/09/2013.

CLINICAL DATA: Sudden on set upper left chest pain at work.
Elevated D-dimer levels. Pulmonary embolism suspected.

EXAM:
CT ANGIOGRAPHY CHEST WITH CONTRAST
TECHNIQUE: Multidetector CT imaging of the chest was performed using the
standard protocol during bolus administration of intravenous
contrast. Multiplanar CT image reconstructions and MIPs were
obtained to evaluate the vascular anatomy.
CONTRAST:  60mL OMNIPAQUE IOHEXOL 350 MG/ML SOLN

[Series 7: thins · axial · 0.98mm/px · z∈[+1513,+1798]mm · 15 of 313 slices shown]
[im 14/313  lung]
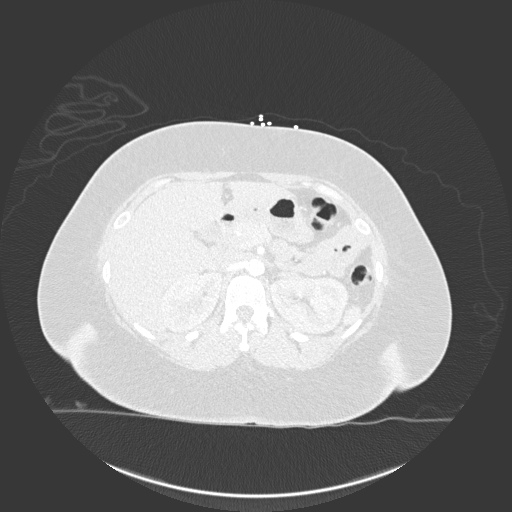
[im 41/313  soft-tissue]
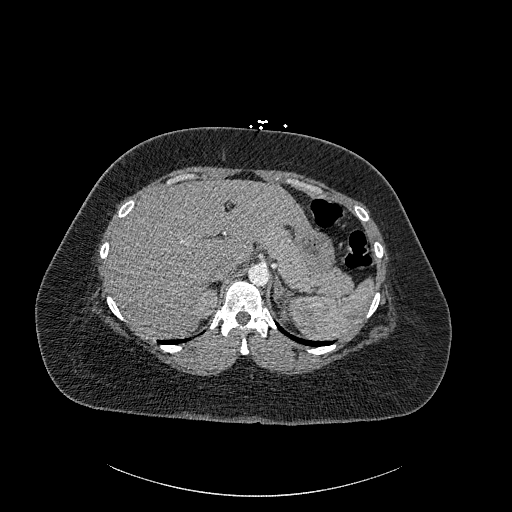
[im 55/313  lung]
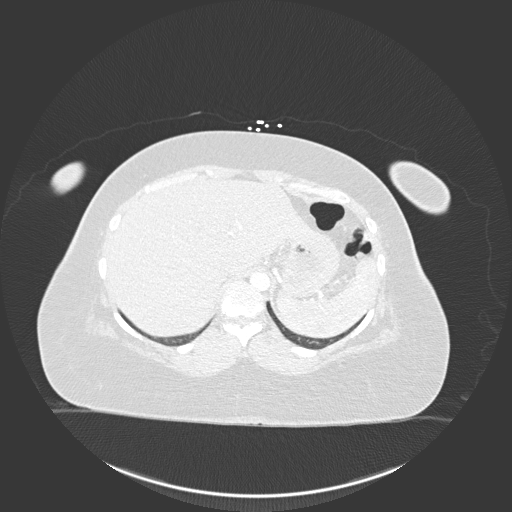
[im 82/313  soft-tissue]
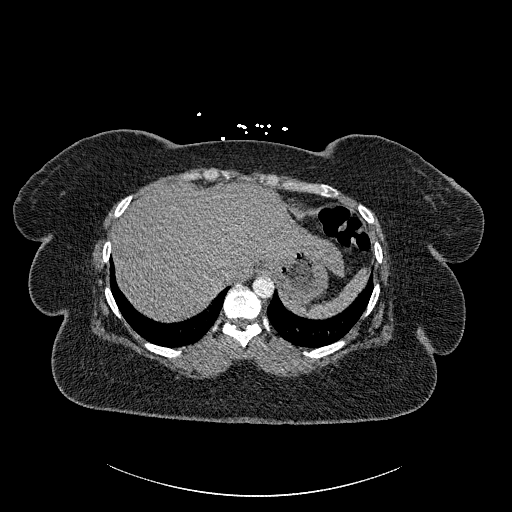
[im 95/313  lung]
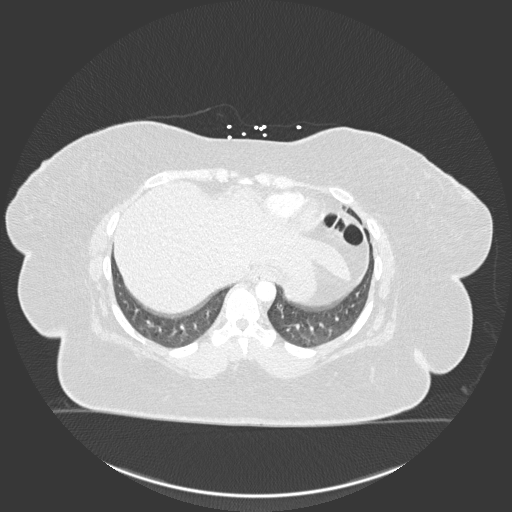
[im 123/313  soft-tissue]
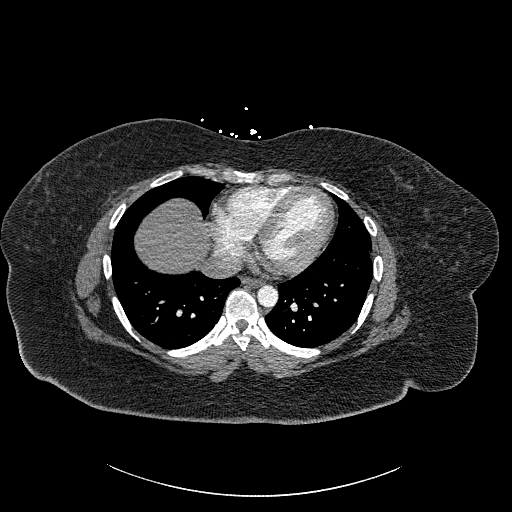
[im 136/313  lung]
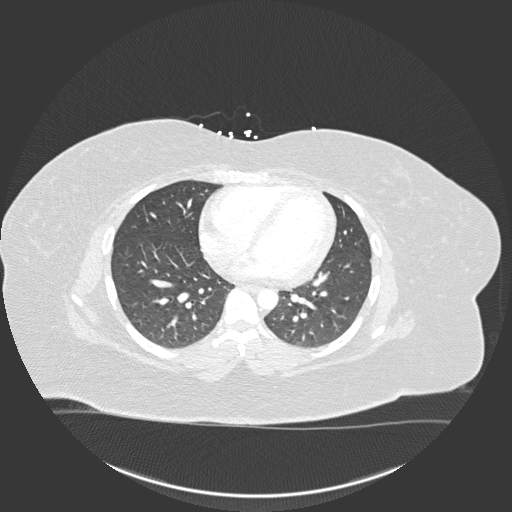
[im 163/313  soft-tissue]
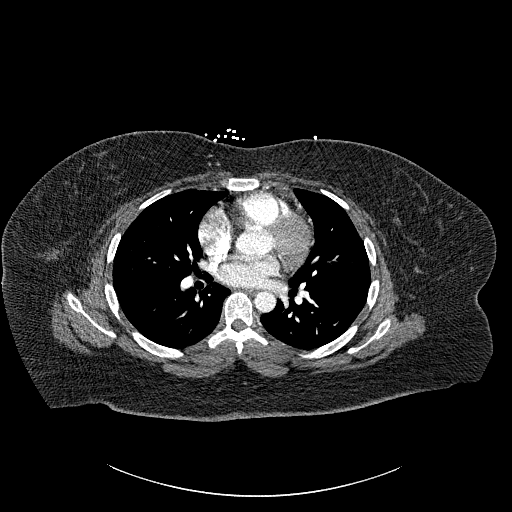
[im 177/313  lung]
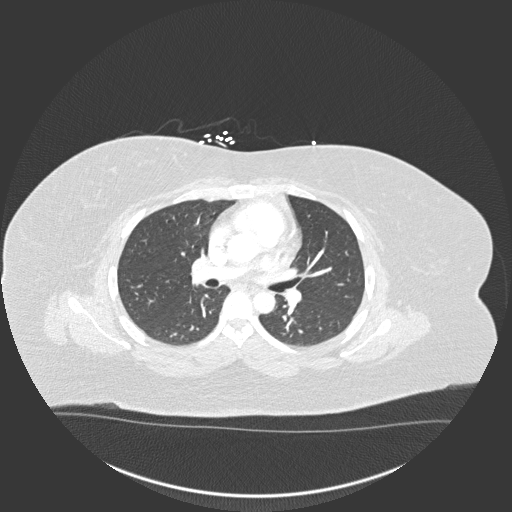
[im 190/313  soft-tissue]
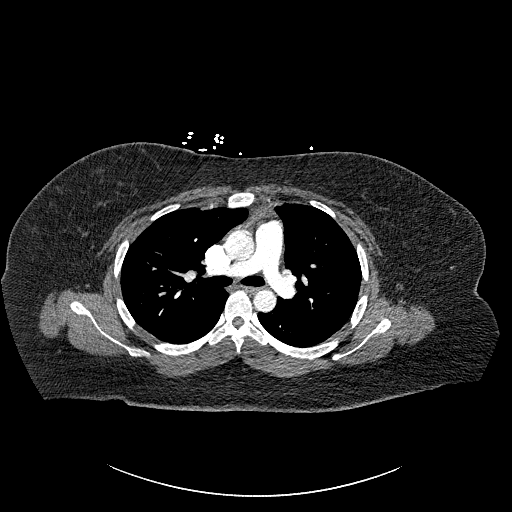
[im 218/313  lung]
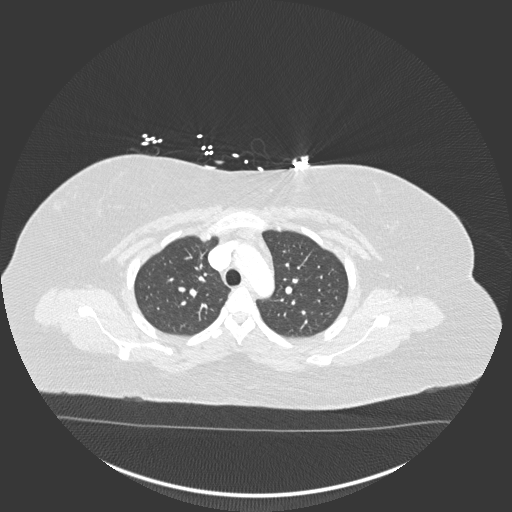
[im 231/313  soft-tissue]
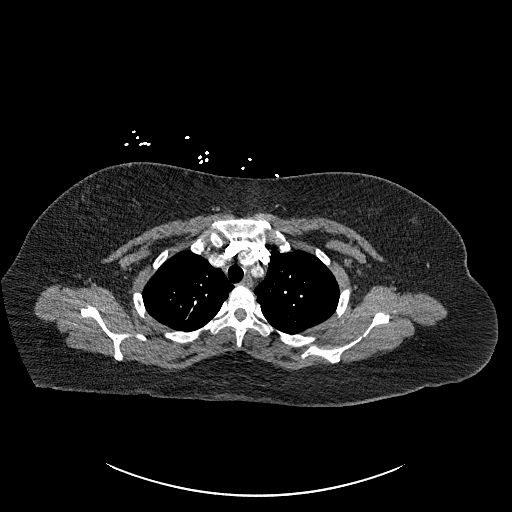
[im 258/313  lung]
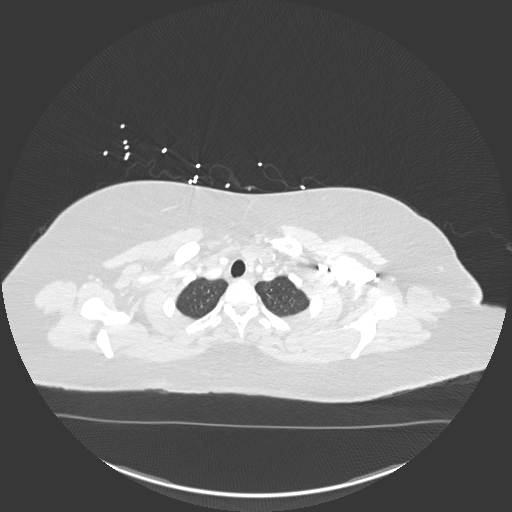
[im 272/313  soft-tissue]
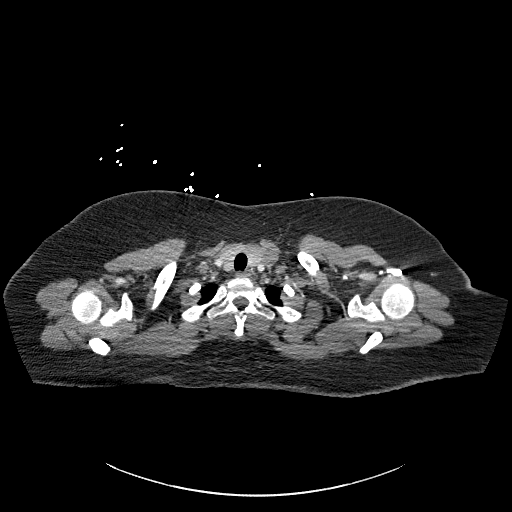
[im 299/313  lung]
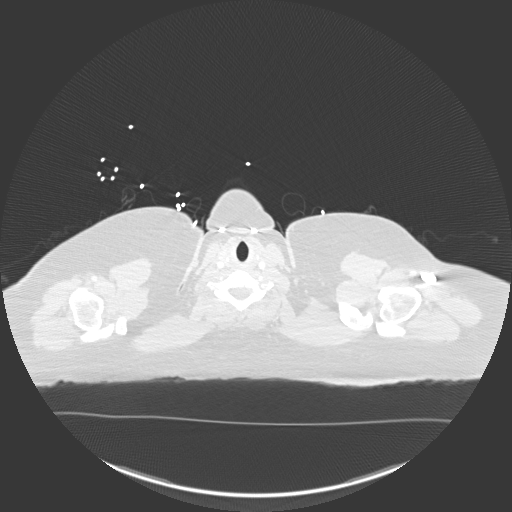

[Series 9: coronal mpr · coronal · 0.63mm/px · 3 of 164 slices shown]
[im 41/164  soft-tissue]
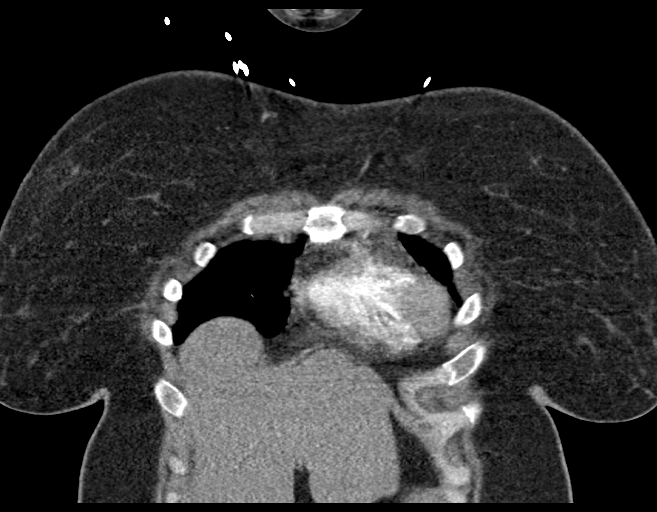
[im 82/164  soft-tissue]
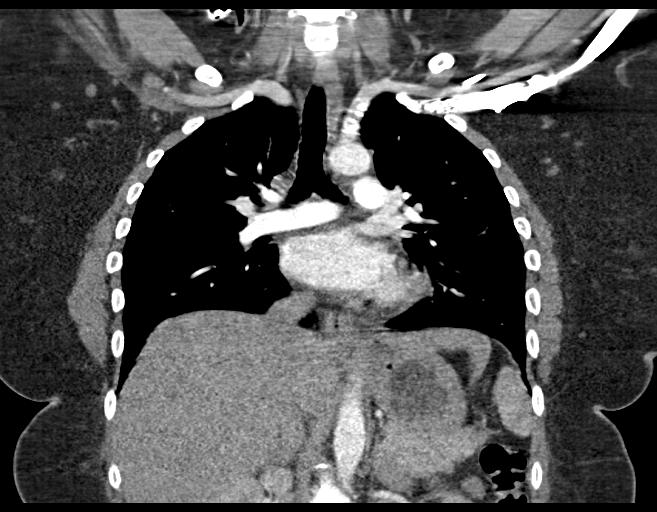
[im 123/164  soft-tissue]
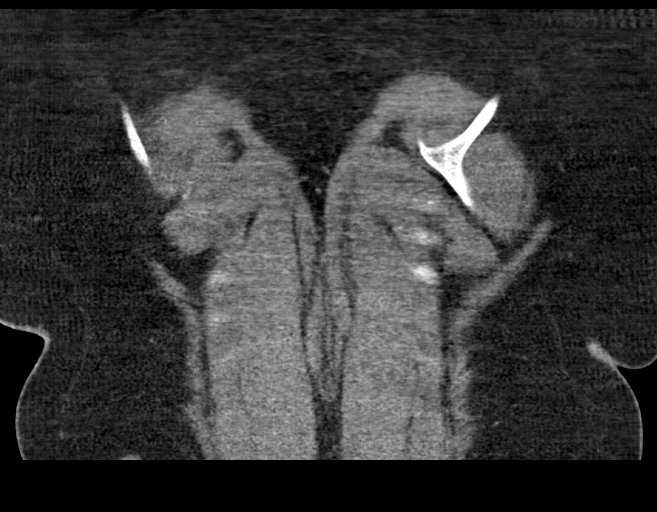

[18 of 46 positions shown; findings below may reference images not displayed]

FINDINGS: Cardiovascular: The pulmonary arteries are well opacified with
contrast to the level of the subsegmental branches. There is no
evidence of acute pulmonary embolism. No systemic arterial
abnormalities are identified. The heart size is normal. There is no
pericardial effusion.

Mediastinum/Nodes: There are no enlarged mediastinal, hilar or
axillary lymph nodes.There is a small amount of residual thymic
tissue in the anterior mediastinum. The thyroid gland, trachea and
esophagus demonstrate no significant findings.

Lungs/Pleura: There is no pleural effusion or pneumothorax. The
lungs are clear.

Upper abdomen: The visualized upper abdomen appears stable without
significant findings.

Musculoskeletal/Chest wall: There is no chest wall mass or
suspicious osseous finding.

Review of the MIP images confirms the above findings.
IMPRESSION: Negative chest CTA. No evidence of acute pulmonary embolism or other
acute chest process.

## 2022-09-20 IMAGING — CR DG CHEST 2V
2 series · 2 of 2 positions shown · non-contrast
Comparison: Radiographs 07/25/2018.

CLINICAL DATA: Chest pain.

EXAM:
CHEST - 2 VIEW

[chest lat]
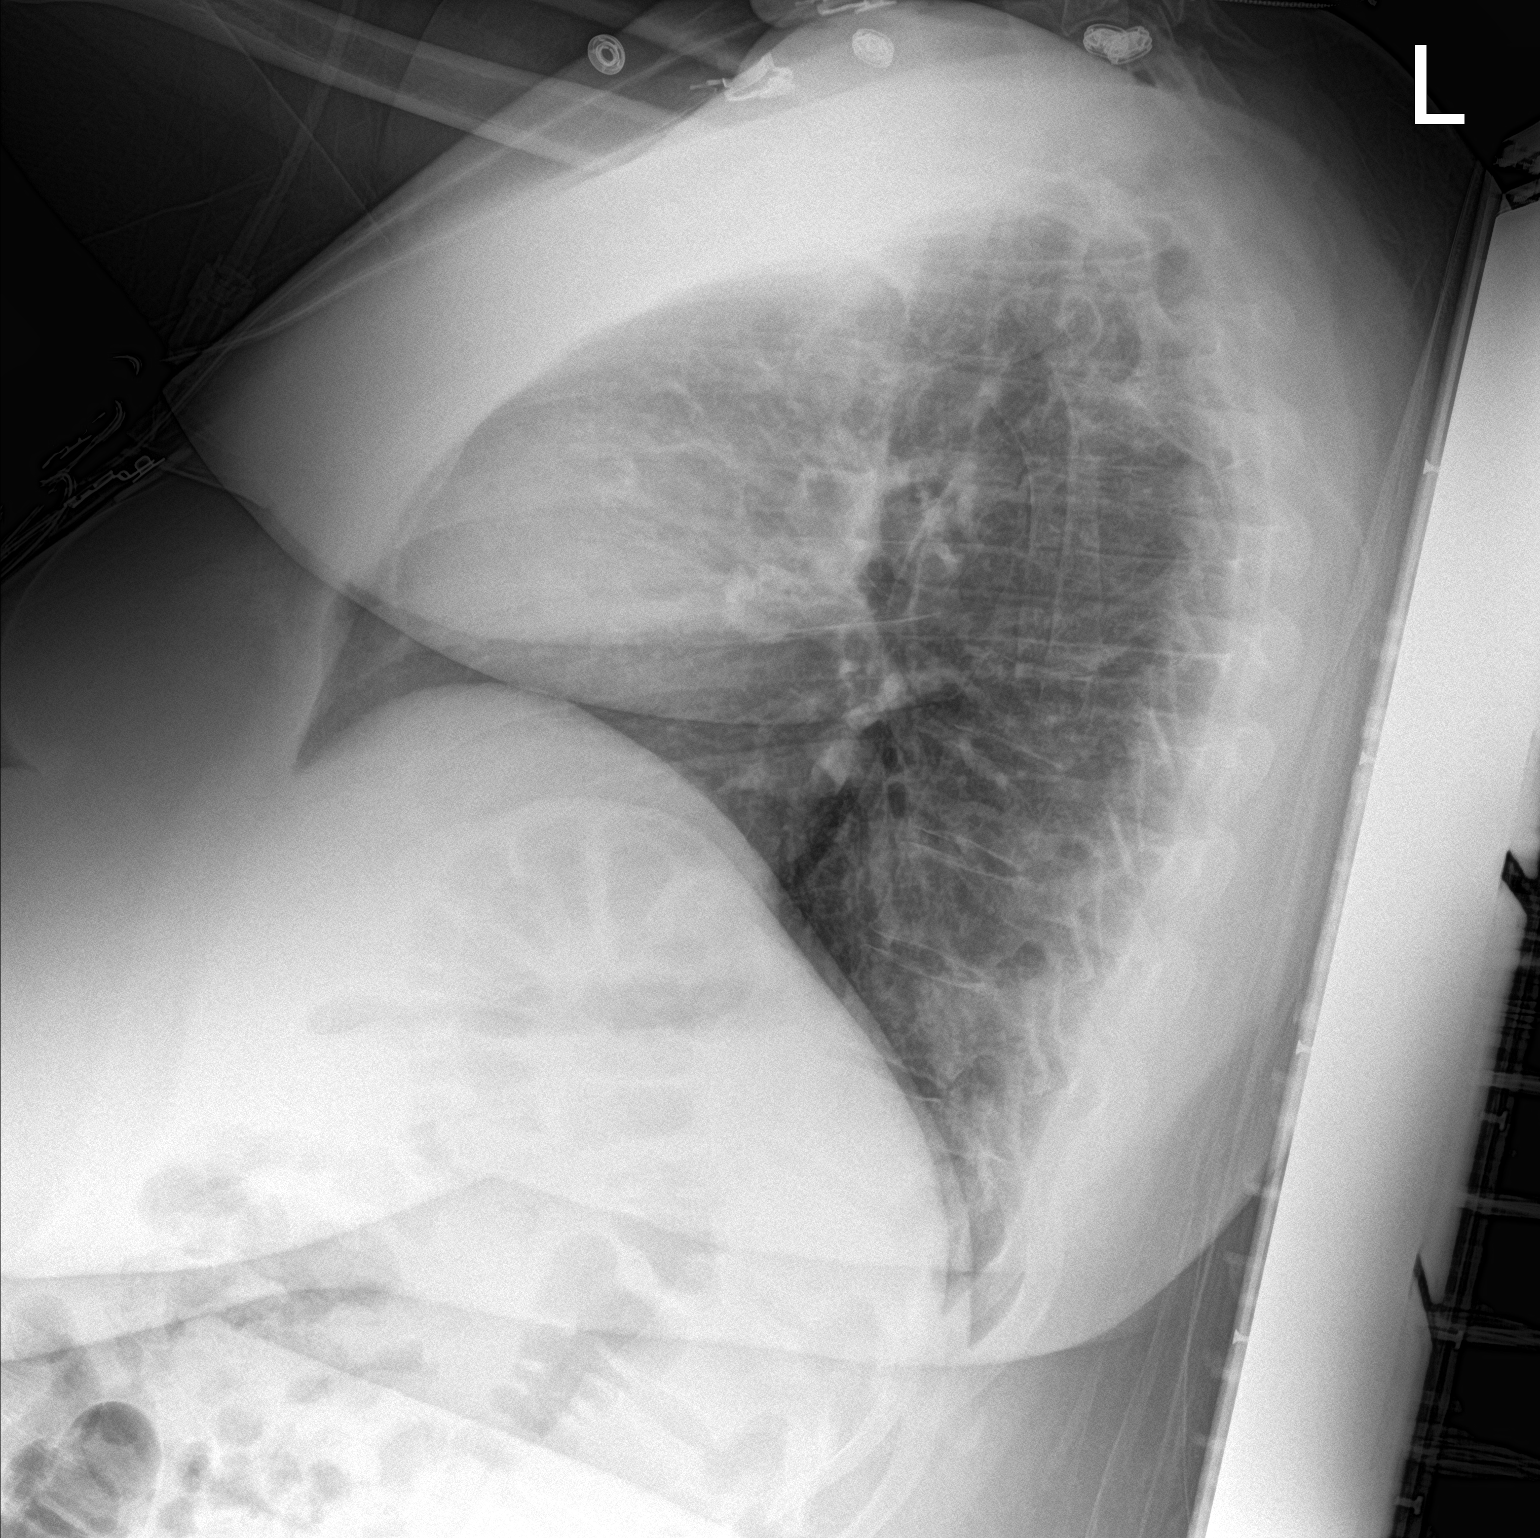

[chest ap]
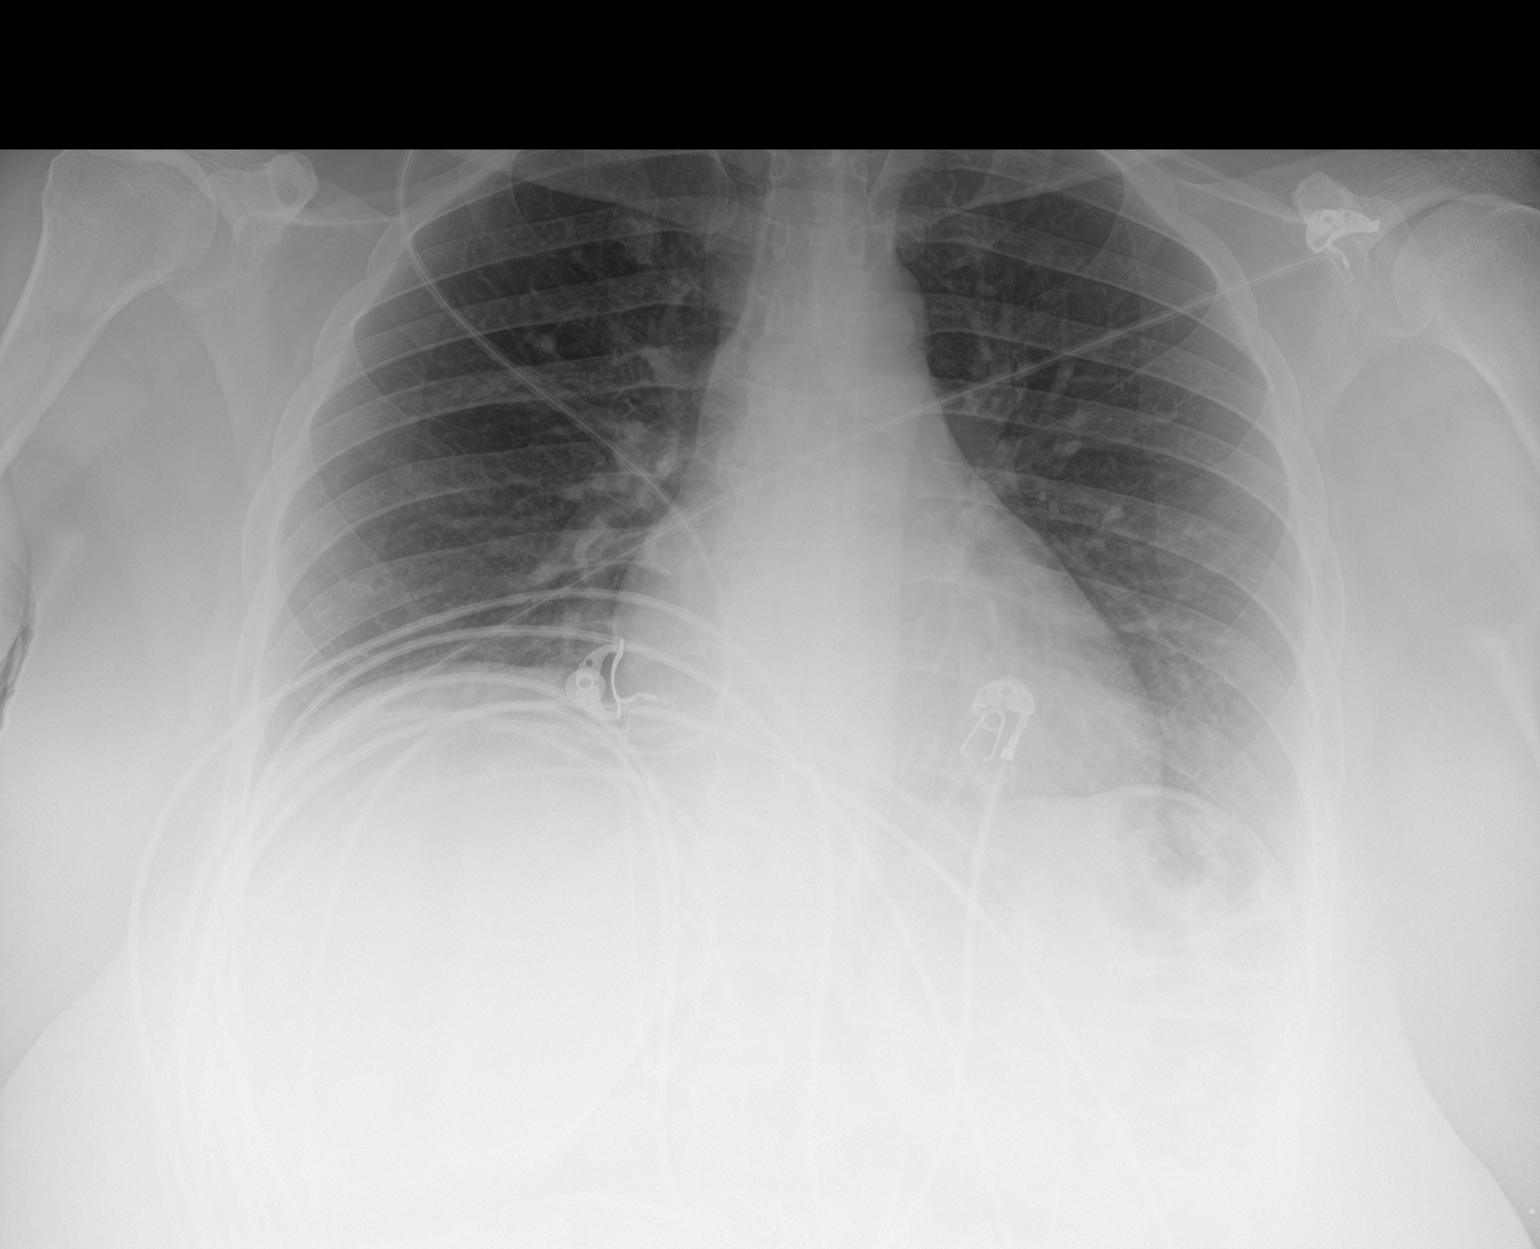

[2 of 2 positions shown; findings below may reference images not displayed]

FINDINGS: The heart size and mediastinal contours are normal. The lungs are
clear. There is no pleural effusion or pneumothorax. No acute
osseous findings are identified. Telemetry leads overlie the chest.
IMPRESSION: Stable chest.  No active cardiopulmonary process.

## 2022-09-30 DIAGNOSIS — Z8759 Personal history of other complications of pregnancy, childbirth and the puerperium: Secondary | ICD-10-CM | POA: Diagnosis not present

## 2022-09-30 DIAGNOSIS — Z8751 Personal history of pre-term labor: Secondary | ICD-10-CM | POA: Diagnosis not present

## 2022-09-30 DIAGNOSIS — O09212 Supervision of pregnancy with history of pre-term labor, second trimester: Secondary | ICD-10-CM | POA: Diagnosis not present

## 2022-10-20 DIAGNOSIS — Z3689 Encounter for other specified antenatal screening: Secondary | ICD-10-CM | POA: Diagnosis not present

## 2022-11-04 DIAGNOSIS — O26873 Cervical shortening, third trimester: Secondary | ICD-10-CM | POA: Diagnosis not present

## 2022-11-04 DIAGNOSIS — Z8751 Personal history of pre-term labor: Secondary | ICD-10-CM | POA: Diagnosis not present

## 2022-11-04 DIAGNOSIS — O99891 Other specified diseases and conditions complicating pregnancy: Secondary | ICD-10-CM | POA: Diagnosis not present

## 2022-11-04 DIAGNOSIS — Z3A31 31 weeks gestation of pregnancy: Secondary | ICD-10-CM | POA: Diagnosis not present

## 2022-11-04 DIAGNOSIS — M5489 Other dorsalgia: Secondary | ICD-10-CM | POA: Diagnosis not present

## 2022-11-04 DIAGNOSIS — O99013 Anemia complicating pregnancy, third trimester: Secondary | ICD-10-CM | POA: Diagnosis not present

## 2022-11-18 DIAGNOSIS — Z3A33 33 weeks gestation of pregnancy: Secondary | ICD-10-CM | POA: Diagnosis not present

## 2022-11-18 DIAGNOSIS — O26873 Cervical shortening, third trimester: Secondary | ICD-10-CM | POA: Diagnosis not present

## 2022-11-18 DIAGNOSIS — O09213 Supervision of pregnancy with history of pre-term labor, third trimester: Secondary | ICD-10-CM | POA: Diagnosis not present

## 2022-12-01 DIAGNOSIS — Z3685 Encounter for antenatal screening for Streptococcus B: Secondary | ICD-10-CM | POA: Diagnosis not present

## 2022-12-01 LAB — OB RESULTS CONSOLE GBS: GBS: POSITIVE

## 2022-12-08 ENCOUNTER — Other Ambulatory Visit: Payer: Self-pay | Admitting: Obstetrics & Gynecology

## 2022-12-16 NOTE — Patient Instructions (Signed)
Melissa Diaz  12/16/2022   Your procedure is scheduled on:  12/30/2022  Arrive at 0930 at Entrance C on [address] at [facility]  and [employer]. You are invited to use the FREE valet parking or use the Visitor's parking deck.  Pick up the phone at the desk and dial [phone].  Call this number if you have problems the morning of surgery: [phone]  Remember:   Do not eat food:(After Midnight) Desps de medianoche.  Do not drink clear liquids: (After Midnight) Desps de medianoche.  Take these medicines the morning of surgery with A SIP OF WATER:  none   Do not wear jewelry, make-up or nail polish.  Do not wear lotions, powders, or perfumes. Do not wear deodorant.  Do not shave 48 hours prior to surgery.  Do not bring valuables to the hospital.  [facility] is not   responsible for any belongings or valuables brought to the hospital.  Contacts, dentures or bridgework may not be worn into surgery.  Leave suitcase in the car. After surgery it may be brought to your room.  For patients admitted to the hospital, checkout time is 11:00 AM the day of              discharge.      Please read over the following fact sheets that you were given:     Preparing for Surgery

## 2022-12-19 ENCOUNTER — Encounter (HOSPITAL_COMMUNITY): Payer: Self-pay

## 2022-12-19 ENCOUNTER — Telehealth (HOSPITAL_COMMUNITY): Payer: Self-pay | Admitting: *Deleted

## 2022-12-19 NOTE — Telephone Encounter (Signed)
Preadmission screen  

## 2022-12-20 ENCOUNTER — Telehealth (HOSPITAL_COMMUNITY): Payer: Self-pay | Admitting: *Deleted

## 2022-12-20 NOTE — Telephone Encounter (Signed)
Preadmission screen  

## 2022-12-21 ENCOUNTER — Encounter (HOSPITAL_COMMUNITY): Payer: Self-pay

## 2022-12-28 ENCOUNTER — Encounter (HOSPITAL_COMMUNITY)
Admission: RE | Admit: 2022-12-28 | Discharge: 2022-12-28 | Disposition: A | Discharge status: Home / Self Care | Payer: 59 | Source: Ambulatory Visit | Attending: Obstetrics & Gynecology | Admitting: Obstetrics & Gynecology

## 2022-12-28 DIAGNOSIS — Z01812 Encounter for preprocedural laboratory examination: Secondary | ICD-10-CM | POA: Insufficient documentation

## 2022-12-28 HISTORY — DX: Cardiac arrhythmia, unspecified: I49.9

## 2022-12-28 HISTORY — DX: Other specified postprocedural states: Z98.890

## 2022-12-28 HISTORY — DX: Other specified postprocedural states: R11.2

## 2022-12-28 LAB — CBC
HCT: 34.1 % — ABNORMAL LOW (ref 36.0–46.0)
Hemoglobin: 10.7 g/dL — ABNORMAL LOW (ref 12.0–15.0)
MCH: 25 pg — ABNORMAL LOW (ref 26.0–34.0)
MCHC: 31.4 g/dL (ref 30.0–36.0)
MCV: 79.7 fL — ABNORMAL LOW (ref 80.0–100.0)
Platelets: 196 10*3/uL (ref 150–400)
RBC: 4.28 MIL/uL (ref 3.87–5.11)
RDW: 15.4 % (ref 11.5–15.5)
WBC: 12.5 10*3/uL — ABNORMAL HIGH (ref 4.0–10.5)
nRBC: 0 % (ref 0.0–0.2)

## 2022-12-28 LAB — TYPE AND SCREEN
ABO/RH(D): O POS
Antibody Screen: NEGATIVE

## 2022-12-28 LAB — RPR: RPR Ser Ql: NONREACTIVE

## 2022-12-28 NOTE — Anesthesia Preprocedure Evaluation (Addendum)
Anesthesia Evaluation  Patient identified by MRN, date of birth, ID band Patient awake    Reviewed: Allergy & Precautions, NPO status , Patient's Chart, lab work & pertinent test results  History of Anesthesia Complications (+) PONV and history of anesthetic complications  Airway Mallampati: II  TM Distance: >3 FB Neck ROM: Full    Dental  (+) Missing,    Pulmonary neg pulmonary ROS   Pulmonary exam normal        Cardiovascular negative cardio ROS Normal cardiovascular exam     Neuro/Psych  Headaches    GI/Hepatic negative GI ROS, Neg liver ROS,,,  Endo/Other    Morbid obesity (BMI 53)  Renal/GU negative Renal ROS     Musculoskeletal negative musculoskeletal ROS (+)    Abdominal   Peds  Hematology  (+) Blood dyscrasia (Hgb 10.7), anemia   Anesthesia Other Findings Day of surgery medications reviewed with patient.  Reproductive/Obstetrics (+) Pregnancy (Hx of C/S x1)                              Anesthesia Physical Anesthesia Plan  ASA: 3  Anesthesia Plan: Spinal   Post-op Pain Management: Ofirmev IV (intra-op)*   Induction:   PONV Risk Score and Plan: 4 or greater and Treatment may vary due to age or medical condition, Dexamethasone, Ondansetron and Scopolamine patch - Pre-op  Airway Management Planned: Natural Airway  Additional Equipment: None  Intra-op Plan:   Post-operative Plan:   Informed Consent: I have reviewed the patients History and Physical, chart, labs and discussed the procedure including the risks, benefits and alternatives for the proposed anesthesia with the patient or authorized representative who has indicated his/her understanding and acceptance.       Plan Discussed with: CRNA  Anesthesia Plan Comments:         Anesthesia Quick Evaluation

## 2022-12-29 NOTE — H&P (Signed)
Melissa Diaz is a 32 y.o. female presenting for repeat C/s and tubal sterilization with salpingectomy at 39 weeks G7P3123 at 39 wks.  [facility] ob/ primar ob Dr Juliene Pina  Problem list -  Obesity HSV Hx without outbeaks Short CL H/o PTD LTCS x 1  G1 and G2 were difficulty vaginal delivery per pt, both 7 lbs (boys, 2011, 2014) then SABs x 2.  G5- term IOL for macrosomia, arrest of descent in 2nd stage with 1' LTCS (boy) 8'3" .  G6- preterm delivery at 24.5 wks, girl, chorioamnionitis, baby didn't live.  Now G7- short CL, saw MFM for cerclage counseling but CL >2 cm at anatomy, so held off and started vaginal prometrium for h/o PTD and has had short CL in pregnancy, continued modified bedrest and vag prometrium stopped at 36 wks.  [place] LGA, no GDM in past or now.   OB History     Gravida  7   Para  2   Term  1   Preterm  1   AB  2   Living  4      SAB  2   IAB  0   Ectopic  0   Multiple      Live Births  4          Past Medical History:  Diagnosis Date   Complication of anesthesia    Hypotensive   Dysrhythmia    Medical history non-contributory    No pertinent past medical history    PONV (postoperative nausea and vomiting)    Urticaria    Past Surgical History:  Procedure Laterality Date   CESAREAN SECTION N/A 11/26/2018   Procedure: CESAREAN SECTION;  Surgeon: Shea Evans, MD;  Location: [facility] LD ORS;  Service: Obstetrics;  Laterality: N/A;   DILATION AND CURETTAGE OF UTERUS     Family History: family history includes Arthritis in her maternal grandmother; Asthma in her brother; Hypertension in her maternal grandmother; Stroke in her paternal grandmother. Social History:  reports that she has never smoked. She has never used smokeless tobacco. She reports that she does not drink alcohol and does not use drugs.     Maternal Diabetes: No Genetic Screening: Declined NIPT, QUAD neg Maternal Ultrasounds/Referrals: Normal Fetal Ultrasounds or  other Referrals:  Referred to Materal Fetal Medicine  for cerclage consult, but didn't need cerclage  Maternal Substance Abuse:  No Significant Maternal Medications:  Meds include: Other: vaginal Prometrium from 16 to 36 wks  Significant Maternal Lab Results:  Group B Strep positive Number of Prenatal Visits:greater than 3 verified prenatal visits Other Comments:   None  Review of Systems History   unknown if currently breastfeeding. Exam Physical Exam  BP 134/67   Pulse (!) 114   Temp 98.1 F (36.7 C) (Oral)   Resp 17   Ht 5\' 1"  (1.549 m)   Wt 129.3 kg   SpO2 100%   Breastfeeding Yes   BMI 53.85 kg/m \  A&O x 3, no acute distress. Pleasant HEENT neg, no thyromegaly Lungs CTA bilat CV RRR, S1S2 normal Abdo soft, non tender, non acute Extr no edema/ tenderness Pelvic cx 2 cm/long/ vx/ stn -4 FHT  130s Toco rare   Prenatal labs: ABO, Rh: --/--/O POS (04/24 0935) Antibody: NEG (04/24 0935) Rubella: Immune (10/10 0000) RPR: NON REACTIVE (04/24 0930)  HBsAg: Negative (10/10 0000)  HIV: Non-reactive (10/10 0000)  GBS: Positive/-- (03/28 0000) Needs vancomycin  QUAD neg Glucola nl  Assessment/Plan: 32  yo 39 wks, repeat C/section and bilateral tubal sterilization- elective.   Risks/complications of surgery reviewed incl infection, bleeding, damage to internal organs including bladder, bowels, ureters, blood vessels, other risks from anesthesia, VTE and delayed complications of any surgery, complications in future surgery reviewed. Also discussed neonatal complications incl difficult delivery, laceration, vacuum assistance, TTN etc. Pt understands and agrees, all concerns addressed.      [name] 12/29/2022, 1:35 PM

## 2022-12-30 ENCOUNTER — Inpatient Hospital Stay (HOSPITAL_COMMUNITY): Payer: 59 | Admitting: Anesthesiology

## 2022-12-30 ENCOUNTER — Other Ambulatory Visit: Payer: Self-pay

## 2022-12-30 ENCOUNTER — Inpatient Hospital Stay (HOSPITAL_COMMUNITY)
Admission: AD | Admit: 2022-12-30 | Discharge: 2023-01-02 | DRG: 784 | Disposition: A | Discharge status: Home / Self Care | Payer: 59 | Attending: Obstetrics & Gynecology | Admitting: Obstetrics & Gynecology

## 2022-12-30 ENCOUNTER — Encounter (HOSPITAL_COMMUNITY)
Admission: AD | Disposition: A | Discharge status: Home / Self Care | Payer: Self-pay | Source: Home / Self Care | Attending: Obstetrics & Gynecology

## 2022-12-30 ENCOUNTER — Encounter (HOSPITAL_COMMUNITY): Payer: Self-pay | Admitting: Obstetrics & Gynecology

## 2022-12-30 DIAGNOSIS — L509 Urticaria, unspecified: Secondary | ICD-10-CM | POA: Diagnosis present

## 2022-12-30 DIAGNOSIS — D649 Anemia, unspecified: Secondary | ICD-10-CM

## 2022-12-30 DIAGNOSIS — Z302 Encounter for sterilization: Secondary | ICD-10-CM | POA: Diagnosis not present

## 2022-12-30 DIAGNOSIS — O99824 Streptococcus B carrier state complicating childbirth: Secondary | ICD-10-CM | POA: Diagnosis present

## 2022-12-30 DIAGNOSIS — O9902 Anemia complicating childbirth: Secondary | ICD-10-CM

## 2022-12-30 DIAGNOSIS — Z3A39 39 weeks gestation of pregnancy: Secondary | ICD-10-CM

## 2022-12-30 DIAGNOSIS — O3663X0 Maternal care for excessive fetal growth, third trimester, not applicable or unspecified: Secondary | ICD-10-CM | POA: Diagnosis present

## 2022-12-30 DIAGNOSIS — O34211 Maternal care for low transverse scar from previous cesarean delivery: Secondary | ICD-10-CM | POA: Diagnosis present

## 2022-12-30 DIAGNOSIS — Z886 Allergy status to analgesic agent status: Secondary | ICD-10-CM

## 2022-12-30 DIAGNOSIS — O9972 Diseases of the skin and subcutaneous tissue complicating childbirth: Secondary | ICD-10-CM | POA: Diagnosis present

## 2022-12-30 DIAGNOSIS — A6000 Herpesviral infection of urogenital system, unspecified: Secondary | ICD-10-CM | POA: Diagnosis present

## 2022-12-30 DIAGNOSIS — O26873 Cervical shortening, third trimester: Secondary | ICD-10-CM | POA: Diagnosis present

## 2022-12-30 DIAGNOSIS — O99214 Obesity complicating childbirth: Secondary | ICD-10-CM | POA: Diagnosis present

## 2022-12-30 DIAGNOSIS — O99820 Streptococcus B carrier state complicating pregnancy: Secondary | ICD-10-CM

## 2022-12-30 DIAGNOSIS — O9832 Other infections with a predominantly sexual mode of transmission complicating childbirth: Secondary | ICD-10-CM | POA: Diagnosis present

## 2022-12-30 DIAGNOSIS — Z98891 History of uterine scar from previous surgery: Principal | ICD-10-CM

## 2022-12-30 HISTORY — DX: Anemia, unspecified: D64.9

## 2022-12-30 LAB — ABO/RH: ABO/RH(D): O POS

## 2022-12-30 SURGERY — Surgical Case
Anesthesia: Spinal | Laterality: Bilateral

## 2022-12-30 MED ORDER — ACETAMINOPHEN 10 MG/ML IV SOLN
INTRAVENOUS | Status: DC | PRN
  Administered 2022-12-30: 1000 mg via INTRAVENOUS

## 2022-12-30 MED ORDER — SIMETHICONE 80 MG PO CHEW
80.0000 mg | CHEWABLE_TABLET | Freq: Three times a day (TID) | ORAL | Status: DC
  Administered 2022-12-31 – 2023-01-02 (×6): 80 mg via ORAL
  Filled 2022-12-30 (×7): qty 1

## 2022-12-30 MED ORDER — MORPHINE SULFATE (PF) 0.5 MG/ML IJ SOLN
INTRAMUSCULAR | Status: AC
  Filled 2022-12-30: qty 10

## 2022-12-30 MED ORDER — PHENYLEPHRINE HCL (PRESSORS) 10 MG/ML IV SOLN
INTRAVENOUS | Status: DC | PRN
  Administered 2022-12-30: 120 ug via INTRAVENOUS
  Administered 2022-12-30: 80 ug via INTRAVENOUS

## 2022-12-30 MED ORDER — OXYTOCIN-SODIUM CHLORIDE 30-0.9 UT/500ML-% IV SOLN: 2.5000 [IU]/h | INTRAVENOUS | Status: AC

## 2022-12-30 MED ORDER — ONDANSETRON HCL 4 MG/2ML IJ SOLN: 4.0000 mg | Freq: Three times a day (TID) | INTRAMUSCULAR | Status: DC | PRN

## 2022-12-30 MED ORDER — GENTAMICIN SULFATE 40 MG/ML IJ SOLN
5.0000 mg/kg | INTRAVENOUS | Status: AC
  Administered 2022-12-30: 398.4 mg via INTRAVENOUS
  Filled 2022-12-30: qty 10

## 2022-12-30 MED ORDER — TRANEXAMIC ACID-NACL 1000-0.7 MG/100ML-% IV SOLN
INTRAVENOUS | Status: DC | PRN
  Administered 2022-12-30: 1000 mg via INTRAVENOUS

## 2022-12-30 MED ORDER — ACETAMINOPHEN 500 MG PO TABS: 1000.0000 mg | ORAL_TABLET | Freq: Once | ORAL | Status: DC

## 2022-12-30 MED ORDER — DEXAMETHASONE SODIUM PHOSPHATE 10 MG/ML IJ SOLN
INTRAMUSCULAR | Status: AC
  Filled 2022-12-30: qty 1

## 2022-12-30 MED ORDER — SODIUM CHLORIDE 0.9% FLUSH: 3.0000 mL | INTRAVENOUS | Status: DC | PRN

## 2022-12-30 MED ORDER — ACETAMINOPHEN 500 MG PO TABS: 1000.0000 mg | ORAL_TABLET | Freq: Four times a day (QID) | ORAL | Status: DC

## 2022-12-30 MED ORDER — BUPIVACAINE IN DEXTROSE 0.75-8.25 % IT SOLN
INTRATHECAL | Status: DC | PRN
  Administered 2022-12-30: 1.6 mL via INTRATHECAL

## 2022-12-30 MED ORDER — STERILE WATER FOR IRRIGATION IR SOLN
Status: DC | PRN
  Administered 2022-12-30: 1000 mL

## 2022-12-30 MED ORDER — ZOLPIDEM TARTRATE 5 MG PO TABS: 5.0000 mg | ORAL_TABLET | Freq: Every evening | ORAL | Status: DC | PRN

## 2022-12-30 MED ORDER — DIBUCAINE (PERIANAL) 1 % EX OINT: 1.0000 | TOPICAL_OINTMENT | CUTANEOUS | Status: DC | PRN

## 2022-12-30 MED ORDER — POVIDONE-IODINE 10 % EX SWAB
2.0000 | Freq: Once | CUTANEOUS | Status: AC
  Administered 2022-12-30: 2 via TOPICAL

## 2022-12-30 MED ORDER — MENTHOL 3 MG MT LOZG: 1.0000 | LOZENGE | OROMUCOSAL | Status: DC | PRN

## 2022-12-30 MED ORDER — MORPHINE SULFATE (PF) 0.5 MG/ML IJ SOLN
INTRAMUSCULAR | Status: DC | PRN
  Administered 2022-12-30: 150 ug via INTRATHECAL

## 2022-12-30 MED ORDER — OXYTOCIN-SODIUM CHLORIDE 30-0.9 UT/500ML-% IV SOLN
INTRAVENOUS | Status: DC | PRN
  Administered 2022-12-30: 30 [IU] via INTRAVENOUS

## 2022-12-30 MED ORDER — NALOXONE HCL 0.4 MG/ML IJ SOLN: 0.4000 mg | INTRAMUSCULAR | Status: DC | PRN

## 2022-12-30 MED ORDER — LACTATED RINGERS IV SOLN: INTRAVENOUS | Status: DC

## 2022-12-30 MED ORDER — DIPHENHYDRAMINE HCL 25 MG PO CAPS: 25.0000 mg | ORAL_CAPSULE | Freq: Four times a day (QID) | ORAL | Status: DC | PRN

## 2022-12-30 MED ORDER — ACETAMINOPHEN 500 MG PO TABS
1000.0000 mg | ORAL_TABLET | Freq: Four times a day (QID) | ORAL | Status: DC
  Administered 2022-12-30 – 2023-01-02 (×9): 1000 mg via ORAL
  Filled 2022-12-30 (×14): qty 2

## 2022-12-30 MED ORDER — ONDANSETRON HCL 4 MG/2ML IJ SOLN
INTRAMUSCULAR | Status: DC | PRN
  Administered 2022-12-30: 4 mg via INTRAVENOUS

## 2022-12-30 MED ORDER — FENTANYL CITRATE (PF) 100 MCG/2ML IJ SOLN
INTRAMUSCULAR | Status: DC | PRN
  Administered 2022-12-30: 15 ug via INTRATHECAL

## 2022-12-30 MED ORDER — DIPHENHYDRAMINE HCL 25 MG PO CAPS
25.0000 mg | ORAL_CAPSULE | ORAL | Status: DC | PRN
  Filled 2022-12-30: qty 1

## 2022-12-30 MED ORDER — METHYLERGONOVINE MALEATE 0.2 MG/ML IJ SOLN
INTRAMUSCULAR | Status: AC
  Filled 2022-12-30: qty 1

## 2022-12-30 MED ORDER — SCOPOLAMINE 1 MG/3DAYS TD PT72
MEDICATED_PATCH | TRANSDERMAL | Status: AC
  Filled 2022-12-30: qty 1

## 2022-12-30 MED ORDER — NALOXONE HCL 4 MG/10ML IJ SOLN: 1.0000 ug/kg/h | INTRAVENOUS | Status: DC | PRN

## 2022-12-30 MED ORDER — DIPHENHYDRAMINE HCL 50 MG/ML IJ SOLN: 12.5000 mg | INTRAMUSCULAR | Status: DC | PRN

## 2022-12-30 MED ORDER — DROPERIDOL 2.5 MG/ML IJ SOLN: 0.6250 mg | Freq: Once | INTRAMUSCULAR | Status: DC | PRN

## 2022-12-30 MED ORDER — ACETAMINOPHEN 160 MG/5ML PO SOLN: 1000.0000 mg | Freq: Once | ORAL | Status: DC

## 2022-12-30 MED ORDER — OXYTOCIN-SODIUM CHLORIDE 30-0.9 UT/500ML-% IV SOLN
INTRAVENOUS | Status: AC
  Filled 2022-12-30: qty 500

## 2022-12-30 MED ORDER — PHENYLEPHRINE HCL-NACL 20-0.9 MG/250ML-% IV SOLN
INTRAVENOUS | Status: DC | PRN
  Administered 2022-12-30: 60 ug/min via INTRAVENOUS

## 2022-12-30 MED ORDER — SIMETHICONE 80 MG PO CHEW
80.0000 mg | CHEWABLE_TABLET | ORAL | Status: DC | PRN
  Administered 2022-12-31: 80 mg via ORAL
  Filled 2022-12-30: qty 1

## 2022-12-30 MED ORDER — CLINDAMYCIN PHOSPHATE 900 MG/50ML IV SOLN
INTRAVENOUS | Status: AC
  Filled 2022-12-30: qty 50

## 2022-12-30 MED ORDER — METHYLERGONOVINE MALEATE 0.2 MG/ML IJ SOLN
INTRAMUSCULAR | Status: DC | PRN
  Administered 2022-12-30: .2 mg via INTRAMUSCULAR

## 2022-12-30 MED ORDER — PHENYLEPHRINE HCL-NACL 20-0.9 MG/250ML-% IV SOLN
INTRAVENOUS | Status: AC
  Filled 2022-12-30: qty 250

## 2022-12-30 MED ORDER — ONDANSETRON HCL 4 MG/2ML IJ SOLN
INTRAMUSCULAR | Status: AC
  Filled 2022-12-30: qty 2

## 2022-12-30 MED ORDER — TRANEXAMIC ACID-NACL 1000-0.7 MG/100ML-% IV SOLN
INTRAVENOUS | Status: AC
  Filled 2022-12-30: qty 100

## 2022-12-30 MED ORDER — FENTANYL CITRATE (PF) 100 MCG/2ML IJ SOLN: 25.0000 ug | INTRAMUSCULAR | Status: DC | PRN

## 2022-12-30 MED ORDER — DEXAMETHASONE SODIUM PHOSPHATE 10 MG/ML IJ SOLN
INTRAMUSCULAR | Status: DC | PRN
  Administered 2022-12-30: 10 mg via INTRAVENOUS

## 2022-12-30 MED ORDER — FENTANYL CITRATE (PF) 100 MCG/2ML IJ SOLN
INTRAMUSCULAR | Status: AC
  Filled 2022-12-30: qty 2

## 2022-12-30 MED ORDER — PRENATAL MULTIVITAMIN CH
1.0000 | ORAL_TABLET | Freq: Every day | ORAL | Status: DC
  Administered 2022-12-31 – 2023-01-02 (×3): 1 via ORAL
  Filled 2022-12-30 (×3): qty 1

## 2022-12-30 MED ORDER — SENNOSIDES-DOCUSATE SODIUM 8.6-50 MG PO TABS
2.0000 | ORAL_TABLET | Freq: Every day | ORAL | Status: DC
  Administered 2022-12-31 – 2023-01-02 (×3): 2 via ORAL
  Filled 2022-12-30 (×3): qty 2

## 2022-12-30 MED ORDER — COCONUT OIL OIL: 1.0000 | TOPICAL_OIL | Status: DC | PRN

## 2022-12-30 MED ORDER — SCOPOLAMINE 1 MG/3DAYS TD PT72
MEDICATED_PATCH | TRANSDERMAL | Status: DC | PRN
  Administered 2022-12-30: 1 via TRANSDERMAL

## 2022-12-30 MED ORDER — OXYCODONE HCL 5 MG PO TABS
5.0000 mg | ORAL_TABLET | ORAL | Status: DC | PRN
  Administered 2022-12-31: 10 mg via ORAL
  Administered 2022-12-31: 5 mg via ORAL
  Administered 2022-12-31 – 2023-01-01 (×4): 10 mg via ORAL
  Administered 2023-01-01 – 2023-01-02 (×2): 5 mg via ORAL
  Filled 2022-12-30 (×3): qty 2
  Filled 2022-12-30: qty 1
  Filled 2022-12-30 (×5): qty 2

## 2022-12-30 MED ORDER — CLINDAMYCIN PHOSPHATE 900 MG/50ML IV SOLN
900.0000 mg | INTRAVENOUS | Status: AC
  Administered 2022-12-30: 900 mg via INTRAVENOUS

## 2022-12-30 MED ORDER — WITCH HAZEL-GLYCERIN EX PADS: 1.0000 | MEDICATED_PAD | CUTANEOUS | Status: DC | PRN

## 2022-12-30 SURGICAL SUPPLY — 39 items
APL SKNCLS STERI-STRIP NONHPOA (GAUZE/BANDAGES/DRESSINGS) ×2
BENZOIN TINCTURE PRP APPL 2/3 (GAUZE/BANDAGES/DRESSINGS) IMPLANT
BINDER ABDOMINAL 12 ML 46-62 (SOFTGOODS) IMPLANT
CHLORAPREP W/TINT 26ML (MISCELLANEOUS) ×2 IMPLANT
CLAMP UMBILICAL CORD (MISCELLANEOUS) ×1 IMPLANT
CLOTH BEACON ORANGE TIMEOUT ST (SAFETY) ×1 IMPLANT
DRSG OPSITE POSTOP 4X10 (GAUZE/BANDAGES/DRESSINGS) ×1 IMPLANT
ELECT REM PT RETURN 9FT ADLT (ELECTROSURGICAL) ×1
ELECTRODE REM PT RTRN 9FT ADLT (ELECTROSURGICAL) ×1 IMPLANT
EXTRACTOR VACUUM KIWI (MISCELLANEOUS) IMPLANT
EXTRACTOR VACUUM M CUP 4 TUBE (SUCTIONS) IMPLANT
GAUZE SPONGE 4X4 12PLY STRL LF (GAUZE/BANDAGES/DRESSINGS) IMPLANT
GLOVE BIO SURGEON STRL SZ7 (GLOVE) ×1 IMPLANT
GLOVE BIOGEL PI IND STRL 7.0 (GLOVE) ×3 IMPLANT
GOWN STRL REUS W/TWL LRG LVL3 (GOWN DISPOSABLE) ×3 IMPLANT
KIT ABG SYR 3ML LUER SLIP (SYRINGE) IMPLANT
LIGASURE IMPACT 36 18CM CVD LR (INSTRUMENTS) IMPLANT
MAT PREVALON FULL STRYKER (MISCELLANEOUS) IMPLANT
NDL HYPO 25X5/8 SAFETYGLIDE (NEEDLE) IMPLANT
NEEDLE HYPO 25X5/8 SAFETYGLIDE (NEEDLE) IMPLANT
NS IRRIG 1000ML POUR BTL (IV SOLUTION) ×1 IMPLANT
PACK C SECTION WH (CUSTOM PROCEDURE TRAY) ×1 IMPLANT
PAD OB MATERNITY 4.3X12.25 (PERSONAL CARE ITEMS) ×1 IMPLANT
RETRACTOR TRAXI PANNICULUS (MISCELLANEOUS) IMPLANT
RTRCTR C-SECT PINK 25CM LRG (MISCELLANEOUS) IMPLANT
STRIP CLOSURE SKIN 1/2X4 (GAUZE/BANDAGES/DRESSINGS) IMPLANT
SUT MNCRL 0 VIOLET CTX 36 (SUTURE) ×2 IMPLANT
SUT MONOCRYL 0 CTX 36 (SUTURE) ×2
SUT PLAIN 0 NONE (SUTURE) IMPLANT
SUT PLAIN 2 0 (SUTURE)
SUT PLAIN ABS 2-0 CT1 27XMFL (SUTURE) IMPLANT
SUT VIC AB 0 CT1 27 (SUTURE) ×2
SUT VIC AB 0 CT1 27XBRD ANBCTR (SUTURE) ×2 IMPLANT
SUT VIC AB 2-0 CT1 27 (SUTURE) ×1
SUT VIC AB 2-0 CT1 TAPERPNT 27 (SUTURE) ×1 IMPLANT
SUT VIC AB 4-0 KS 27 (SUTURE) ×1 IMPLANT
TOWEL OR 17X24 6PK STRL BLUE (TOWEL DISPOSABLE) ×1 IMPLANT
TRAY FOLEY W/BAG SLVR 14FR LF (SET/KITS/TRAYS/PACK) IMPLANT
WATER STERILE IRR 1000ML POUR (IV SOLUTION) ×1 IMPLANT

## 2022-12-30 NOTE — Anesthesia Postprocedure Evaluation (Signed)
Anesthesia Post Note  Patient: Melissa Diaz  Procedure(s) Performed: Repeat CESAREAN SECTION WITH BILATERAL TUBAL LIGATION (Bilateral)     Patient location during evaluation: PACU Anesthesia Type: Spinal Level of consciousness: awake and alert Pain management: pain level controlled Vital Signs Assessment: post-procedure vital signs reviewed and stable Respiratory status: spontaneous breathing, nonlabored ventilation and respiratory function stable Cardiovascular status: blood pressure returned to baseline Postop Assessment: no apparent nausea or vomiting, spinal receding, no headache and no backache Anesthetic complications: no   No notable events documented.  Last Vitals:  Vitals:   12/30/22 1415 12/30/22 1430  BP: (!) 136/121   Pulse: 82 76  Resp: 13 13  Temp:    SpO2: 99% 98%    Last Pain:  Vitals:   12/30/22 1430  TempSrc:   PainSc: 0-No pain   Pain Goal:    LLE Motor Response: Purposeful movement (12/30/22 1430)   RLE Motor Response: Purposeful movement (12/30/22 1430)   L Sensory Level: T6 (12/30/22 1430) R Sensory Level: T6 (12/30/22 1430) Epidural/Spinal Function Cutaneous sensation: Able to Wiggle Toes (12/30/22 1430), Patient able to flex knees: Yes (12/30/22 1430), Patient able to lift hips off bed: No (12/30/22 1430), Back pain beyond tenderness at insertion site: No (12/30/22 1430), Progressively worsening motor and/or sensory loss: No (12/30/22 1430), Bowel and/or bladder incontinence post epidural: No (12/30/22 1430)  Shanda Howells

## 2022-12-30 NOTE — Op Note (Signed)
Procedure Note 12/30/2022 Melissa Diaz  Repeat Cesarean Section and bilateral salpingectomy for sterilization.   Indications: Scheduled Proceedure/Maternal Request and sterilization request.    39 weeks Short cervix in pregnancy H/o one prior C-section   Pre-operative Diagnosis: Previous Cesarean Section, Desires Sterilization.   Post-operative Diagnosis: Same   Surgeon: Surgeon(s) and Role:    * Shea Evans, MD - Primary    * Celedonio Savage, MD - Assisting   Anesthesia: spinal   Procedure Details:  The patient was seen in the Holding Room. The risks, benefits, complications, treatment options, and expected outcomes were discussed with the patient. The patient concurred with the proposed plan, giving informed consent. identified as Melissa Diaz and the procedure verified as C-Section Delivery and tubal sterilization. A Time Out was held and the above information confirmed. Gentamicin and Clindamycin given per protocol.   After induction of anesthesia, the patient was draped and prepped in the usual sterile manner, foley was draining urine well.  A pfannenstiel incision was made and carried down through the subcutaneous tissue to the fascia. Fascial incision was made and extended transversely. The fascia was separated from the underlying rectus tissue superiorly and inferiorly. The peritoneum was identified and entered. Peritoneal incision was extended longitudinally. Omental adhesions from midline separated and packed with moist lap. Alexis-O retractor placed. The utero-vesical peritoneal reflection was incised transversely and the bladder flap was bluntly freed from the lower uterine segment. A low transverse uterine incision was made. Copious amniotic fluid drained. Active bleeding started from hysterotomy sinuses/ varicosities and clamped with Rings. Delivered from cephalic presentation was a female infant with vigorous cry. Apgar scores of 9 at one minute and 9 at five minutes.  Delayed cord clamping done at 1 minute and baby handed to NICU team in attendance. Cord ph was not sent. Cord blood was obtained for evaluation. The placenta was removed Intact and appeared normal. The uterine outline, tubes and ovaries appeared normal}. The uterine incision was closed with running locked sutures of . A second imbricating layer sutured in two areas of bleeding. Marland Kitchen   Hemostasis was observed.  Both fallopian tubes and ovaries were normal. Both tubes were free of adhesions. Grasped with Babcock's and Ligasure used for complete salpingectomy and sent to path.  Hemostasis noted.  Alexis retractor removed. Peritoneal closure done with 2-0 Vicryl.  I needed to use Fish to close peritoneum due to bulky omentum in the way. The fascia was then reapproximated with running sutures of 0Vicryl. The subcuticular closure was performed using 2-0plain gut. The skin was closed with 4-0Vicryl.  Honeycomb and pressure dressing placed.   Instrument, sponge, and needle counts were correct prior the abdominal closure and were correct at the conclusion of the case.   Findings:   Estimated Blood Loss: 1082  Total IV Fluids: 2900 ml  LR   Urine Output: 150 cc clear in foley  Specimen: both fallopian tubes   Complications: no complications  Disposition: PACU - hemodynamically stable.   Maternal Condition: stable   Baby condition / location:  Couplet care / Skin to Skin  Attending Attestation: I performed the procedure.   Signed: Surgeon(s): Celedonio Savage, MD Shea Evans, MD

## 2022-12-30 NOTE — Lactation Note (Signed)
This note was copied from a baby's chart. Lactation Consultation Note  Patient Name: Girl Breuna Loveall RUEAV'W Date: 12/30/2022 Age:32 hours Reason for consult: Initial assessment (Infant is DAT+. Per Birth Parent her other 3 children were jaundice and her son was on billi lights while in hospital. See Birth Parent MR- 2nd C/S delivery).  P5, infant briefly latched in recovery but infant did not sustained latch, Birth Parent latched infant on her left breast using the football hold, initially infant did not sustained latch, infant was supplemented with 1 ml of formula at the breast with curve tip syringe and started sustaining latch and BF for 15 minutes, afterwards infant was supplemented with  5 mls of formula with curve tip syringe. Infant is DAT+ Per Birth Parent, all other children was jaundice. LC discussed infant's input and out put, the importance of maternal rest, diet and hydration. Birth Parent was  made aware of O/P services, breastfeeding support groups, community resources, and our phone # for post-discharge questions.    Birth Parent current feeding plan: 1- Birth Parent will continue to BF infant by cues, on demand, 8 to 12+ times within 24 hours, STS. 2- Birth Parent plans to continue to use DEBP  every 3 hours for 15 minutes on initial setting to help stimulate milk supply. 3- Birth Parent and give infant back any EBM before supplementing infant with formula. Birth Parent given handout " Supplementing with Breastfeeding", Day 1 offer (5-13mls) per feeding, Birth Parent knows EBM is safe for 4 hours whereas formula must be used within 1 hour. 4- Birth Parent choose to supplement infant with curve tip syringe after latching infant at the breast,  instead of using bottle nipple 1st day of life.    Maternal Data Has patient been taught Hand Expression?: Yes Does the patient have breastfeeding experience prior to this delivery?: Yes How long did the patient breastfeed?: Per Birth  Parent she BF 1st child one year, 2nd child for 6 months and 3rd child for one year.  Feeding Mother's Current Feeding Choice: Breast Milk and Formula  LATCH Score Latch: Repeated attempts needed to sustain latch, nipple held in mouth throughout feeding, stimulation needed to elicit sucking reflex.  Audible Swallowing: A few with stimulation  Type of Nipple: Everted at rest and after stimulation  Comfort (Breast/Nipple): Soft / non-tender  Hold (Positioning): Assistance needed to correctly position infant at breast and maintain latch.  LATCH Score: 7   Lactation Tools Discussed/Used Tools: Pump Breast pump type: Double-Electric Breast Pump Pump Education: Setup, frequency, and cleaning;Milk Storage Reason for Pumping: C/S delivery, colostrum currently not present with expression, infant DAT+ Pumping frequency: Birth Parent will continue to pump every 3 hours for 15 minutes on inital setting.  Interventions Interventions: Breast feeding basics reviewed;Adjust position;Support pillows;Assisted with latch;Skin to skin;Position options;Education;DEBP;Expressed milk;Breast massage;Breast compression;LC Services brochure  Discharge Pump: DEBP;Personal (Birth Parent has DEBP at home.)  Consult Status Consult Status: Follow-up from L&D    Frederico Hamman 12/30/2022, 6:23 PM

## 2022-12-30 NOTE — Transfer of Care (Signed)
Immediate Anesthesia Transfer of Care Note  Patient: Melissa Diaz  Procedure(s) Performed: Repeat CESAREAN SECTION WITH BILATERAL TUBAL LIGATION (Bilateral)  Patient Location: PACU  Anesthesia Type:Spinal  Level of Consciousness: awake, alert , and oriented  Airway & Oxygen Therapy: Patient Spontanous Breathing  Post-op Assessment: Report given to RN and Post -op Vital signs reviewed and stable  Post vital signs: Reviewed and stable  Last Vitals:  Vitals Value Taken Time  BP    Temp    Pulse 83 12/30/22 1347  Resp 15 12/30/22 1347  SpO2 99 % 12/30/22 1347  Vitals shown include unvalidated device data.  Last Pain:  Vitals:   12/30/22 0937  TempSrc: Oral  PainSc: 3          Complications: No notable events documented.

## 2022-12-30 NOTE — Anesthesia Procedure Notes (Signed)
Spinal  Patient location during procedure: OR Start time: 12/30/2022 12:00 PM End time: 12/30/2022 12:03 PM Reason for block: surgical anesthesia Staffing Performed: anesthesiologist  Anesthesiologist: Kaylyn Layer, MD Performed by: Kaylyn Layer, MD Authorized by: Kaylyn Layer, MD   Preanesthetic Checklist Completed: patient identified, IV checked, risks and benefits discussed, monitors and equipment checked, pre-op evaluation and timeout performed Spinal Block Patient position: sitting Prep: DuraPrep and site prepped and draped Patient monitoring: heart rate, continuous pulse ox and blood pressure Approach: midline Location: L3-4 Injection technique: single-shot Needle Needle type: Pencan  Needle gauge: 24 G Needle length: 10 cm Assessment Sensory level: T4 Events: CSF return Additional Notes Risks, benefits, and alternative discussed. Patient gave consent to procedure. Prepped and draped in sitting position. Clear CSF obtained after one needle pass. Positive terminal aspiration. No pain or paraesthesias with injection. Patient tolerated procedure well. Vital signs stable. Melissa Greenhouse, MD

## 2022-12-31 LAB — CBC
HCT: 28.6 % — ABNORMAL LOW (ref 36.0–46.0)
Hemoglobin: 9.3 g/dL — ABNORMAL LOW (ref 12.0–15.0)
MCH: 25.7 pg — ABNORMAL LOW (ref 26.0–34.0)
MCHC: 32.5 g/dL (ref 30.0–36.0)
MCV: 79 fL — ABNORMAL LOW (ref 80.0–100.0)
Platelets: 184 10*3/uL (ref 150–400)
RBC: 3.62 MIL/uL — ABNORMAL LOW (ref 3.87–5.11)
RDW: 15.5 % (ref 11.5–15.5)
WBC: 19.2 10*3/uL — ABNORMAL HIGH (ref 4.0–10.5)
nRBC: 0.1 % (ref 0.0–0.2)

## 2022-12-31 LAB — BIRTH TISSUE RECOVERY COLLECTION (PLACENTA DONATION)

## 2022-12-31 MED ORDER — POLYSACCHARIDE IRON COMPLEX 150 MG PO CAPS
150.0000 mg | ORAL_CAPSULE | Freq: Every day | ORAL | Status: DC
  Administered 2022-12-31 – 2023-01-02 (×3): 150 mg via ORAL
  Filled 2022-12-31 (×3): qty 1

## 2022-12-31 MED ORDER — MAGNESIUM OXIDE -MG SUPPLEMENT 400 (240 MG) MG PO TABS
400.0000 mg | ORAL_TABLET | Freq: Two times a day (BID) | ORAL | Status: DC
  Administered 2022-12-31 – 2023-01-02 (×5): 400 mg via ORAL
  Filled 2022-12-31 (×5): qty 1

## 2022-12-31 MED ORDER — CETIRIZINE HCL 10 MG PO TABS
10.0000 mg | ORAL_TABLET | Freq: Two times a day (BID) | ORAL | Status: DC
  Administered 2022-12-31 – 2023-01-01 (×4): 10 mg via ORAL
  Filled 2022-12-31 (×10): qty 1

## 2022-12-31 NOTE — Progress Notes (Signed)
POD# 1  S: Pt notes pain controlled w/ po meds, though difficult to find pain medicines due to aspirin allergy.  Minimal lochia, nl void, out of bed w/o dizziness or chest pain, tol reg po, + flatus. Pt is breastfeeding and pumping.  Patient notes history of chronic urticaria and worsening itchiness right now.  Patient requesting to restart home Zyrtec.  Baby recently taken to the NICU for jaundice and IV therapy.  Vitals:   12/30/22 1632 12/30/22 2100 12/31/22 0139 12/31/22 0519  BP: 113/72  131/67 (!) 110/59  Pulse: 76  84 68  Resp: 18 18 16 16   Temp: 98 F (36.7 C) 98 F (36.7 C) 97.9 F (36.6 C) 97.9 F (36.6 C)  TempSrc: Axillary Axillary Axillary Axillary  SpO2: 99% 100% 99% 98%  Weight:      Height:        Gen: well appearing, obese CV: RRR Pulm: CTAB Abd: soft, ND, approp tender, fundus below umbilicus, NT, obese Inc: Pressure dressing in place without staining LE: tr edema, NT  CBC    Component Value Date/Time   WBC 19.2 (H) 12/31/2022 0439   RBC 3.62 (L) 12/31/2022 0439   HGB 9.3 (L) 12/31/2022 0439   HCT 28.6 (L) 12/31/2022 0439   PLT 184 12/31/2022 0439   MCV 79.0 (L) 12/31/2022 0439   MCH 25.7 (L) 12/31/2022 0439   MCHC 32.5 12/31/2022 0439   RDW 15.5 12/31/2022 0439   LYMPHSABS 1.5 06/13/2014 0820   MONOABS 1.0 06/13/2014 0820   EOSABS 0.1 06/13/2014 0820   BASOSABS 0.0 06/13/2014 0820    A/P: POD# 1 s/p repeat cesarean section and bilateral tubal ligation - post-op. Doing well.  -Resume home Zyrtec for chronic urticaria.  Recommend shower now to help with itching.  Discussed with patient much of her itching is likely related to spinal anesthetic. -Mild anemia.  Start iron and magnesium. -Continue Tylenol and oxycodone for pain.  Lendon Colonel 12/31/2022 1:15 PM

## 2022-12-31 NOTE — Lactation Note (Signed)
This note was copied from a baby's chart.  NICU Lactation Consultation Note  Patient Name: Melissa Casondra Diaz WUJWJ'X Date: 12/31/2022 Age:32 hours  Reason for consult: Follow-up assessment; Other (Comment); NICU baby; Term; Infant weight loss; Hyperbilirubinemia (DAT (+), - 6% weight loss)  SUBJECTIVE Visited with family of 32 hours old FT NICU female; Ms. Noyes is a P5 but baby Melissa # 4 passed away at 32 01/13/23 weeks; this is baby # 5. She's been pumping consistently because baby hasn't been latching at every feeding, already getting droplets of colostrum, praised her for her efforts. Her plan is to exclusively breastfeed once baby is able to, he's been very sleepy due to hyperbilirubinemia; he's currently under the lights and bottle feeding Similac 20 calorie formula on ad lib status. Reviewed pumping schedule, feeding cues, lactogenesis II, newborn jaundice and anticipatory guidelines.  OBJECTIVE Infant data: Mother's Current Feeding Choice: Breast Milk and Formula  Infant feeding assessment Scale for Readiness: 1 Scale for Quality: 3 (desatting to mid 80's)   Maternal data: G7P2125  C-Section, Low Transverse Has patient been taught Hand Expression?: Yes Hand Expression Comments: colostrum noted Significant Breast History:: (+) breast changes during the pregnancy Current breast feeding challenges:: NICU admission Previous breastfeeding challenges?: Breast / nipple pain; Other (Comment) (Her second baby had bad reflux and it was getting painful on her nipples. She stopped breastfeeding baby # 3 at one year because she got pregnant with baby # 4.) Does the patient have breastfeeding experience prior to this delivery?: Yes How long did the patient breastfeed?: 1st child for 2 years, second one for 6 months and 3rd one for 1 year Pumping frequency: 8 times/24 hours Pumped volume: 0 mL (drops) Risk factor for low milk supply:: 1047 cc PPH, infant separation  Pump: Personal (Medela DEBP  at home)  ASSESSMENT Infant: LATCH Documentation Latch: 1 Audible Swallowing: 1 Type of Nipple: 2 Comfort (Breast/Nipple): 2 Hold (Positioning): 1 LATCH Score: 7  Feeding Status: Ad lib  Maternal: Milk volume: Normal  INTERVENTIONS/PLAN Interventions: Interventions: Breast feeding basics reviewed; DEBP; Education Tools: Pump Pump Education: Setup, frequency, and cleaning; Milk Storage  Plan: Encouraged to continue pumping every 3 hours, ideally 8 pumping sessions/24 hours Breast massage and hand expression were also encouraged prior pumping  No other support person at this time, FOB and the three big brothers were in 517. All questions and concerns answered, family to contact [facility] services PRN.  Consult Status: NICU follow-up NICU Follow-up type: New admission follow up   Laverne Klugh Venetia Constable 12/31/2022, 4:28 PM

## 2023-01-01 NOTE — Lactation Note (Signed)
This note was copied from a baby's chart.  NICU Lactation Consultation Note  Patient Name: Melissa Diaz [id number]'[id number] Date: 01/01/2023 Age:32 hours  Reason for consult: Follow-up assessment; NICU baby; Term  SUBJECTIVE  LC in to visit with P5 Mom of term baby in the NICU for hyperbilirubinemia.  Baby continues under phototherapy and being bottle feeding EBM+/formula.  Mom continues to be having post-operative pain.   Reviewed importance of consistent pumping to establish a full milk supply. Mom denies any questions.  OBJECTIVE Infant data: Mother's Current Feeding Choice: Breast Milk and Formula  Infant feeding assessment Scale for Readiness: 1 Scale for Quality: 2   Maternal data: [id number]  C-Section, Low Transverse Has patient been taught Hand Expression?: Yes Hand Expression Comments: colostrum noted Significant Breast History:: (+) breast changes during the pregnancy Current breast feeding challenges:: NICU admission Previous breastfeeding challenges?: Breast / nipple pain; Other (Comment) (Her second baby had bad reflux and it was getting painful on her nipples. She stopped breastfeeding baby # 3 at one year because she got pregnant with baby # 4.) How long did the patient breastfeed?: 1st child for 2 years, second one for 6 months and 3rd one for 1 year Pumping frequency: Encouraged to pump every 3 hrs Pumped volume: 0 mL (drops) Flange Size: 24 Risk factor for low milk supply:: 1047 cc PPH, infant separation  Pump: Personal, DEBP (Medela)  ASSESSMENT Infant: Feeding Status: Ad lib  Maternal: Milk volume: Normal  INTERVENTIONS/PLAN Interventions: Interventions: Breast feeding basics reviewed; Skin to skin; Breast massage; Hand express; DEBP; Education Tools: Pump; Flanges; Bottle Pump Education: Setup, frequency, and cleaning  Plan: Consult Status: NICU follow-up NICU Follow-up type: Verify onset of copious milk; Verify absence of engorgement   Judee Clara 01/01/2023, 4:37 PM

## 2023-01-01 NOTE — Progress Notes (Signed)
POD# 2  S: Pt notes pain controlled w/ po meds though increase in bladder pain with void, minimal lochia, nl void, out of bed w/o dizziness or chest pain, tol reg po, + flatus. Pt is pumping as baby now in NICU. No BM yet.  Vitals:   12/31/22 0519 12/31/22 1539 12/31/22 2024 01/01/23 0522  BP: (!) 110/59 (!) 111/58 129/71 (!) 127/57  Pulse: 68 99 69 79  Resp: 16 18 18 16   Temp: 97.9 F (36.6 C) 98.3 F (36.8 C) 98.4 F (36.9 C) 98.2 F (36.8 C)  TempSrc: Axillary Oral Oral Oral  SpO2: 98%  100% 100%  Weight:      Height:        Gen: well appearing, obese CV: RRR Pulm: CTAB Abd: soft, ND, approp tender, fundus below umbilicus, NT Inc: pressure dressing, no staining LE: tr edema, NT  CBC    Component Value Date/Time   WBC 19.2 (H) 12/31/2022 0439   RBC 3.62 (L) 12/31/2022 0439   HGB 9.3 (L) 12/31/2022 0439   HCT 28.6 (L) 12/31/2022 0439   PLT 184 12/31/2022 0439   MCV 79.0 (L) 12/31/2022 0439   MCH 25.7 (L) 12/31/2022 0439   MCHC 32.5 12/31/2022 0439   RDW 15.5 12/31/2022 0439   LYMPHSABS 1.5 06/13/2014 0820   MONOABS 1.0 06/13/2014 0820   EOSABS 0.1 06/13/2014 0820   BASOSABS 0.0 06/13/2014 0820    A/P: POD#  2 s/p RCS - post-op. Doing well.  - pain appropriate, cont meds - anemia, on iron  Lendon Colonel 01/01/2023 2:21 PM

## 2023-01-02 ENCOUNTER — Encounter (HOSPITAL_COMMUNITY): Payer: Self-pay | Admitting: Obstetrics & Gynecology

## 2023-01-02 MED ORDER — SENNOSIDES-DOCUSATE SODIUM 8.6-50 MG PO TABS: 2.0000 | ORAL_TABLET | Freq: Every day | ORAL | Status: AC

## 2023-01-02 MED ORDER — OXYCODONE HCL 5 MG PO TABS: 5.0000 mg | ORAL_TABLET | ORAL | 0 refills | Status: AC | PRN

## 2023-01-02 MED ORDER — POLYSACCHARIDE IRON COMPLEX 150 MG PO CAPS: 150.0000 mg | ORAL_CAPSULE | Freq: Every day | ORAL | Status: AC

## 2023-01-02 NOTE — Progress Notes (Signed)
Patient screened out for psychosocial assessment since none of the following apply: Psychosocial stressors documented in mother or baby's chart Gestation less than 32 weeks Code at delivery  Infant with anomalies Please contact the Clinical Social Worker if specific needs arise or by MOB's request.  MOB's Edinburgh Score is 3.  Brown Dunlap Boyd-Gilyard, MSW, LCSW Clinical Social Work [phone]   

## 2023-01-02 NOTE — Discharge Summary (Signed)
Postpartum Discharge Summary  Date of Service updated 01/02/23     Patient Name: Melissa Diaz DOB: 1991-08-15 MRN: [id number]  Date of admission: 12/30/2022 Delivery date:12/30/2022  Delivering provider: MODY, VAISHALI  Date of discharge: 01/02/2023  Admitting diagnosis: Previous cesarean section [Z98.891] Status post repeat low transverse cesarean section [Z98.891] Intrauterine pregnancy: [redacted]w[redacted]d     Secondary diagnosis:  Principal Problem:   Previous cesarean section Active Problems:   Status post repeat low transverse cesarean section  Additional problems: n/a    Discharge diagnosis: Term Pregnancy Delivered                                              Post partum procedures: n/a Augmentation: N/A Complications: None  Hospital course: Sceduled C/S   32 y.o. yo G7P2125 at [redacted]w[redacted]d was admitted to the hospital 12/30/2022 for scheduled cesarean section with the following indication:Elective Repeat.Delivery details are as follows:  Membrane Rupture Time/Date: 12:32 PM ,12/30/2022   Delivery Method:C-Section, Low Transverse  Details of operation can be found in separate operative note.  Patient had a postpartum course complicated bynone.  She is ambulating, tolerating a regular diet, passing flatus, and urinating well. Patient is discharged home in stable condition on  01/02/23        Newborn Data: Birth date:12/30/2022  Birth time:12:32 PM  Gender:Female  Living status:Living  Apgars:8 ,9  Weight:3960 g     Magnesium Sulfate received: No BMZ received: No Rhophylac:N/A MMR:N/A   Physical exam  Vitals:   12/31/22 2024 01/01/23 0522 01/01/23 1500 01/01/23 2127  BP: 129/71 (!) 127/57 127/82 127/85  Pulse: 69 79 88 86  Resp: 18 16 16 18   Temp: 98.4 F (36.9 C) 98.2 F (36.8 C) 97.6 F (36.4 C) 97.9 F (36.6 C)  TempSrc: Oral Oral Oral Oral  SpO2: 100% 100% 100% 99%  Weight:      Height:       General: alert and no distress Lochia: appropriate Uterine Fundus:  firm Incision: Healing well with no significant drainage DVT Evaluation: No evidence of DVT seen on physical exam. Labs: Lab Results  Component Value Date   WBC 19.2 (H) 12/31/2022   HGB 9.3 (L) 12/31/2022   HCT 28.6 (L) 12/31/2022   MCV 79.0 (L) 12/31/2022   PLT 184 12/31/2022      Latest Ref Rng & Units 11/26/2018   11:33 AM  CMP  Creatinine 0.44 - 1.00 mg/dL 4.09    Edinburgh Score:    12/30/2022    3:25 PM  Edinburgh Postnatal Depression Scale Screening Tool  I have been able to laugh and see the funny side of things. 0  I have looked forward with enjoyment to things. 0  I have blamed myself unnecessarily when things went wrong. 0  I have been anxious or worried for no good reason. 0  I have felt scared or panicky for no good reason. 1  Things have been getting on top of me. 1  I have been so unhappy that I have had difficulty sleeping. 0  I have felt sad or miserable. 1  I have been so unhappy that I have been crying. 0  The thought of harming myself has occurred to me. 0  Edinburgh Postnatal Depression Scale Total 3      After visit meds:  Allergies as of 01/02/2023  Reactions   Aspirin Hives, Swelling   Hives and lip swelling   Penicillins Hives   Did it involve swelling of the face/tongue/throat, SOB, or low BP? No Did it involve sudden or severe rash/hives, skin peeling, or any reaction on the inside of your mouth or nose? Yes Did you need to seek medical attention at a hospital or doctor's office? No When did it last happen?      1 year ago If all above answers are "NO", may proceed with cephalosporin use.   Latex Hives   Loratadine Hives   Other Nausea And Vomiting   Anesthesia Will need anti nausea medication        Medication List     STOP taking these medications    pantoprazole 40 MG tablet Commonly known as: PROTONIX       TAKE these medications    acetaminophen 500 MG tablet Commonly known as: TYLENOL Take 500 mg by mouth  every 6 (six) hours as needed (pain.).   cetirizine 10 MG tablet Commonly known as: ZYRTEC Take 10 mg by mouth in the morning and at bedtime.   EPINEPHrine 0.3 mg/0.3 mL Soaj injection Commonly known as: EPI-PEN Inject 0.3 mg into the muscle as needed for anaphylaxis.   iron polysaccharides 150 MG capsule Commonly known as: NIFEREX Take 1 capsule (150 mg total) by mouth daily.   lansoprazole 15 MG capsule Commonly known as: PREVACID Take 15 mg by mouth daily as needed (indigestion/heartburn.).   oxyCODONE 5 MG immediate release tablet Commonly known as: Oxy IR/ROXICODONE Take 1-2 tablets (5-10 mg total) by mouth every 4 (four) hours as needed for moderate pain.   prenatal multivitamin Tabs tablet Take 2 tablets by mouth in the morning and at bedtime.   senna-docusate 8.6-50 MG tablet Commonly known as: Senokot-S Take 2 tablets by mouth daily.   valACYclovir 500 MG tablet Commonly known as: VALTREX Take 500 mg by mouth daily as needed (Breakout).               Discharge Care Instructions  (From admission, onward)           Start     Ordered   01/02/23 0000  Discharge wound care:       Comments: Remove Honeycomb dressing by 1 week postpartum. Remove underlying steri strips by 2 weeks postpartum. Keep incision clean and dry.   01/02/23 1054             Discharge home in stable condition Infant Feeding: Bottle and Breast Infant Disposition:NICU Discharge instruction: per After Visit Summary and Postpartum booklet. Activity: Advance as tolerated. Pelvic rest for 6 weeks.  Diet: routine diet Anticipated Birth Control: BTL done PP Postpartum Appointment:6 weeks Additional Postpartum F/U:  n/a Future Appointments:No future appointments. Follow up Visit:  Patient to follow up with Dr. Juliene Pina at [facility] in 6 weeks for postpartum visit or sooner as needed.    01/02/2023 [name] [name], DO

## 2023-01-02 NOTE — Lactation Note (Signed)
This note was copied from a baby's chart.  NICU Lactation Consultation Note  Patient Name: Melissa Diaz ZOXWR'U Date: 01/02/2023 Age:32 hours  Reason for consult: Follow-up assessment; NICU baby; Term; Maternal discharge  SUBJECTIVE  LC in to visit with P5 Mom of term baby in the NICU for phototherapy and IVF.  Mom has been consistently pumping and milk is coming to volume.  Breasts filling, not engorged.  Encouraged continued consistent pumping to support a full milk supply. Encouraged STS with baby when allowed.  Mom to have baby's RN call lactation for a breastfeeding assist when she is ready. Engorgement prevention and treatment reviewed.   OBJECTIVE Infant data: No data recorded Infant feeding assessment Scale for Readiness: 1 Scale for Quality: 2   Maternal data: [id number]  C-Section, Low Transverse Pumping frequency: every 2-3 hrs Pumped volume: 90 mL Flange Size: 24  Pump: Personal, DEBP (Medela)  ASSESSMENT Infant: Feeding Status: Ad lib  Maternal: Milk volume: Normal  INTERVENTIONS/PLAN Interventions: Interventions: Breast feeding basics reviewed; Breast massage; Hand express; Skin to skin; DEBP; Education Tools: Pump; Flanges; Hands-free pumping top; Bottle Pump Education: Setup, frequency, and cleaning  Plan: Consult Status: NICU follow-up NICU Follow-up type: Verify absence of engorgement; Maternal D/C visit   Melissa Diaz 01/02/2023, 10:05 AM

## 2023-01-03 ENCOUNTER — Ambulatory Visit (HOSPITAL_COMMUNITY): Payer: Self-pay

## 2023-01-03 NOTE — Lactation Note (Signed)
This note was copied from a baby's chart.  NICU Lactation Consultation Note  Patient Name: Melissa Diaz ZOXWR'U Date: 01/03/2023 Age:32 days  Reason for consult: Follow-up assessment; NICU baby; Term; Hyperbilirubinemia; Breastfeeding assistance  SUBJECTIVE  RN called to assist Mom with first breastfeeding now that phototherapy has been DC'd.  Baby has been feeding EBM by bottle with standard flow nipple.    Baby frantic when [facility] entered the room.  Mom trying to latch baby in cradle hold, pinching areola close to nipple.  RN was dripping breast milk onto nipple to entice baby.  Mom has a large nipple and baby has a small, narrow mouth.  Baby latched and sucked a few times before coming off upset. After a few attempts and baby becoming more frantic, LC initiated a 24 mm nipple shield to help transition baby onto the breast.  EBM instilled into shield with curved tip syringe.  Baby still unable to do a few sucks when milk in the NS, but quickly becomes upset that the milk flow isn't there.  Talked to Mom regarding offering the breast after baby baby's feeding when she isn't so frantic.  Using the breast as a dessert to acclimate her to the flow of milk at the breast.  When baby was taking the bottle, assisted Mom in using a paced feeding method, holding the bottle more horizontal.  Baby tucks her upper lips which Mom know to untuck to help with suction.  Talked about the importance of continued pumping to support a full milk supply. Discussed the importance of OP lactation F/U after discharge, Mom desires this. Baby may be discharged tomorrow.  Message sent to [facility] RN IBCLC at [facility].  OBJECTIVE Infant data: No data recorded Infant feeding assessment Scale for Readiness: 1 Scale for Quality: 1   Maternal data: [id number]  C-Section, Low Transverse Pumping frequency: Every 3 hrs Pumped volume: 90 mL Flange Size: 24  Pump: Personal, DEBP  (Medela)  ASSESSMENT Infant: LATCH Documentation Latch: 1 Audible Swallowing: 0 Type of Nipple: 2 (large diameter nipples) Comfort (Breast/Nipple): 2 Hold (Positioning): 1 LATCH Score: 6  Feeding Status: Ad lib  Maternal: Milk volume: Normal  INTERVENTIONS/PLAN Interventions: Interventions: Skin to skin; Breast massage; Hand express; Adjust position; Support pillows; Position options; DEBP; Education; Pace feeding Discharge Education: Outpatient recommendation; Outpatient Epic message sent Tools: Pump; Flanges; Bottle; Nipple Shields Pump Education: Setup, frequency, and cleaning; Milk Storage Nipple shield size: 24  Plan: Consult Status: NICU follow-up NICU Follow-up type: Baby's discharge   Judee Clara 01/03/2023, 1:51 PM

## 2023-01-03 NOTE — Lactation Note (Addendum)
This note was copied from a baby's chart.  NICU Lactation Consultation Note  Patient Name: Melissa Diaz ZOXWR'U Date: 01/03/2023 Age:32  Reason for consult: Follow-up assessment; NICU baby; Term; Hyperbilirubinemia; Nipple pain/trauma  SUBJECTIVE  LC stopped in to visit Mom at baby's bedside.  Baby is at a 6% weight loss. Baby continues on double phototherapy, serum bilirubin pending. Levels are stable.  Baby is taking bottles of MOM ad lib.  Mom has done STS, but hasn't tried to latch baby to the breast yet.  Mom is consistently pumping and volume is increasing.  Provided another hand's free pumping band (XL) and assisted with pumping.   Mom denies any nipple trauma, but does complain of nipple pain on initial pumping session.  Mom encouraged to keep suction strength low that she isn't in pain.  Mom using a nipple balm.  Encouraged coconut oil.  Comfort Gels provided, placed in frig.  Mom knows she can call for [facility] assistance once baby can breastfeed.     OBJECTIVE Infant data: No data recorded Infant feeding assessment Scale for Readiness: 1 Scale for Quality: 1   Maternal data: [id number]  C-Section, Low Transverse Pumping frequency: Every 3 hrs Pumped volume: 90 mL Flange Size: 24  Pump: Personal, DEBP (Medela)  ASSESSMENT Infant: Feeding Status: Ad lib  Maternal: Milk volume: Normal  INTERVENTIONS/PLAN Interventions: Interventions: Breast feeding basics reviewed; Breast massage; Hand express; Skin to skin; DEBP; Education Discharge Education: Engorgement and breast care Tools: Pump; Flanges; Hands-free pumping top; Bottle Pump Education: Setup, frequency, and cleaning; Milk Storage  Plan: Consult Status: NICU follow-up NICU Follow-up type: Verify absence of engorgement; Verify onset of copious milk   Judee Clara 01/03/2023, 9:56 AM

## 2023-01-04 ENCOUNTER — Inpatient Hospital Stay (HOSPITAL_COMMUNITY): Admission: AD | Admit: 2023-01-04 | Payer: 59 | Source: Home / Self Care | Admitting: Obstetrics & Gynecology

## 2023-01-06 LAB — SURGICAL PATHOLOGY

## 2023-01-10 ENCOUNTER — Telehealth (HOSPITAL_COMMUNITY): Payer: Self-pay

## 2023-01-10 NOTE — Telephone Encounter (Signed)
Patient reports feeling good. Patient declines questions/concerns about her health and healing.  Patient reports that baby is doing well. Eating, peeing/pooping, and gaining weight well. Patient reports that baby had a doctor's appointment today and that baby is doing good. Baby sleeps in a bassinet. RN reviewed ABC's of safe sleep with patient. Patient declines any questions or concerns about baby.  EPDS score is 4.  [facility]  Perinatal Services   01/10/23,1827
